# Patient Record
Sex: Female | Born: 1990 | Race: Black or African American | Hispanic: No | Marital: Single | Smoking: Current every day smoker
Health system: Southern US, Community
[De-identification: ages and names within clinical notes are randomized; demographics above are authoritative.]

## PROBLEM LIST (undated history)

## (undated) ENCOUNTER — Emergency Department (HOSPITAL_COMMUNITY): Admission: EM | Payer: Self-pay | Source: Home / Self Care

## (undated) DIAGNOSIS — Z8619 Personal history of other infectious and parasitic diseases: Secondary | ICD-10-CM

## (undated) DIAGNOSIS — B009 Herpesviral infection, unspecified: Secondary | ICD-10-CM

## (undated) DIAGNOSIS — B9689 Other specified bacterial agents as the cause of diseases classified elsewhere: Secondary | ICD-10-CM

## (undated) DIAGNOSIS — J302 Other seasonal allergic rhinitis: Secondary | ICD-10-CM

## (undated) DIAGNOSIS — E119 Type 2 diabetes mellitus without complications: Secondary | ICD-10-CM

## (undated) HISTORY — DX: Type 2 diabetes mellitus without complications: E11.9

## (undated) HISTORY — PX: NO PAST SURGERIES: SHX2092

## (undated) HISTORY — DX: Other seasonal allergic rhinitis: J30.2

## (undated) HISTORY — DX: Personal history of other infectious and parasitic diseases: Z86.19

## (undated) HISTORY — DX: Other specified bacterial agents as the cause of diseases classified elsewhere: B96.89

---
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
## 2008-05-15 ENCOUNTER — Emergency Department (HOSPITAL_COMMUNITY): Admission: EM | Admit: 2008-05-15 | Discharge: 2008-05-15 | Payer: Self-pay | Admitting: Emergency Medicine

## 2008-05-28 ENCOUNTER — Emergency Department (HOSPITAL_COMMUNITY): Admission: EM | Admit: 2008-05-28 | Discharge: 2008-05-28 | Payer: Self-pay | Admitting: Emergency Medicine

## 2008-09-20 ENCOUNTER — Emergency Department (HOSPITAL_COMMUNITY): Admission: EM | Admit: 2008-09-20 | Discharge: 2008-09-20 | Payer: Self-pay | Admitting: Emergency Medicine

## 2008-09-22 ENCOUNTER — Emergency Department (HOSPITAL_COMMUNITY): Admission: EM | Admit: 2008-09-22 | Discharge: 2008-09-22 | Payer: Self-pay | Admitting: Family Medicine

## 2009-06-05 ENCOUNTER — Emergency Department (HOSPITAL_COMMUNITY): Admission: EM | Admit: 2009-06-05 | Discharge: 2009-06-06 | Payer: Self-pay | Admitting: Emergency Medicine

## 2009-07-05 ENCOUNTER — Emergency Department (HOSPITAL_COMMUNITY): Admission: EM | Admit: 2009-07-05 | Discharge: 2009-07-05 | Payer: Self-pay | Admitting: Emergency Medicine

## 2009-09-11 ENCOUNTER — Emergency Department (HOSPITAL_COMMUNITY): Admission: EM | Admit: 2009-09-11 | Discharge: 2009-09-11 | Payer: Self-pay | Admitting: Emergency Medicine

## 2009-11-17 ENCOUNTER — Emergency Department (HOSPITAL_COMMUNITY): Admission: EM | Admit: 2009-11-17 | Discharge: 2009-11-17 | Payer: Self-pay | Admitting: Emergency Medicine

## 2009-12-11 ENCOUNTER — Emergency Department (HOSPITAL_COMMUNITY): Admission: EM | Admit: 2009-12-11 | Discharge: 2009-12-11 | Payer: Self-pay | Admitting: Emergency Medicine

## 2010-01-10 ENCOUNTER — Inpatient Hospital Stay (HOSPITAL_COMMUNITY): Admission: AD | Admit: 2010-01-10 | Discharge: 2010-01-10 | Payer: Self-pay | Admitting: Obstetrics & Gynecology

## 2010-04-17 ENCOUNTER — Emergency Department (HOSPITAL_COMMUNITY): Admission: EM | Admit: 2010-04-17 | Discharge: 2010-04-18 | Payer: Self-pay | Admitting: Internal Medicine

## 2010-06-16 ENCOUNTER — Emergency Department (HOSPITAL_COMMUNITY): Admission: EM | Admit: 2010-06-16 | Discharge: 2010-06-17 | Payer: Self-pay | Admitting: Emergency Medicine

## 2010-07-22 ENCOUNTER — Inpatient Hospital Stay (HOSPITAL_COMMUNITY): Admission: AD | Admit: 2010-07-22 | Discharge: 2010-07-22 | Payer: Self-pay | Admitting: Obstetrics and Gynecology

## 2010-07-22 ENCOUNTER — Ambulatory Visit: Payer: Self-pay | Admitting: Family

## 2010-10-05 ENCOUNTER — Emergency Department (HOSPITAL_COMMUNITY): Admission: EM | Admit: 2010-10-05 | Discharge: 2010-10-05 | Payer: Self-pay | Admitting: Emergency Medicine

## 2010-10-08 ENCOUNTER — Emergency Department (HOSPITAL_COMMUNITY): Admission: EM | Admit: 2010-10-08 | Discharge: 2010-10-08 | Payer: Self-pay | Admitting: Emergency Medicine

## 2010-12-31 ENCOUNTER — Other Ambulatory Visit: Payer: Self-pay

## 2010-12-31 ENCOUNTER — Emergency Department (HOSPITAL_COMMUNITY)
Admission: EM | Admit: 2010-12-31 | Discharge: 2010-12-31 | Discharge status: Against Medical Advice | Payer: Self-pay | Attending: Emergency Medicine | Admitting: Emergency Medicine

## 2010-12-31 DIAGNOSIS — Z0389 Encounter for observation for other suspected diseases and conditions ruled out: Secondary | ICD-10-CM | POA: Insufficient documentation

## 2011-01-31 ENCOUNTER — Emergency Department (HOSPITAL_COMMUNITY)
Admission: EM | Admit: 2011-01-31 | Discharge: 2011-01-31 | Disposition: A | Discharge status: Home / Self Care | Payer: Self-pay | Attending: Emergency Medicine | Admitting: Emergency Medicine

## 2011-01-31 DIAGNOSIS — Z0389 Encounter for observation for other suspected diseases and conditions ruled out: Secondary | ICD-10-CM | POA: Insufficient documentation

## 2011-02-05 LAB — URINALYSIS, ROUTINE W REFLEX MICROSCOPIC
Bilirubin Urine: NEGATIVE
Glucose, UA: NEGATIVE mg/dL
Hgb urine dipstick: NEGATIVE
Protein, ur: NEGATIVE mg/dL
Urobilinogen, UA: 0.2 mg/dL (ref 0.0–1.0)

## 2011-02-05 LAB — GC/CHLAMYDIA PROBE AMP, GENITAL: GC Probe Amp, Genital: POSITIVE — AB

## 2011-02-05 LAB — WET PREP, GENITAL
Trich, Wet Prep: NONE SEEN
Yeast Wet Prep HPF POC: NONE SEEN

## 2011-02-05 LAB — RPR: RPR Ser Ql: NONREACTIVE

## 2011-02-05 LAB — POCT PREGNANCY, URINE: Preg Test, Ur: NEGATIVE

## 2011-02-08 LAB — URINALYSIS, ROUTINE W REFLEX MICROSCOPIC
Bilirubin Urine: NEGATIVE
Glucose, UA: NEGATIVE mg/dL
Ketones, ur: NEGATIVE mg/dL
Protein, ur: NEGATIVE mg/dL
pH: 6 (ref 5.0–8.0)

## 2011-02-08 LAB — WET PREP, GENITAL: Yeast Wet Prep HPF POC: NONE SEEN

## 2011-02-08 LAB — GC/CHLAMYDIA PROBE AMP, GENITAL: Chlamydia, DNA Probe: NEGATIVE

## 2011-02-09 LAB — WET PREP, GENITAL: Yeast Wet Prep HPF POC: NONE SEEN

## 2011-02-09 LAB — URINALYSIS, ROUTINE W REFLEX MICROSCOPIC
Bilirubin Urine: NEGATIVE
Hgb urine dipstick: NEGATIVE
Ketones, ur: NEGATIVE mg/dL
Protein, ur: NEGATIVE mg/dL
Urobilinogen, UA: 1 mg/dL (ref 0.0–1.0)

## 2011-02-09 LAB — PREGNANCY, URINE: Preg Test, Ur: NEGATIVE

## 2011-02-09 LAB — GC/CHLAMYDIA PROBE AMP, GENITAL: GC Probe Amp, Genital: NEGATIVE

## 2011-02-10 LAB — URINALYSIS, ROUTINE W REFLEX MICROSCOPIC
Glucose, UA: NEGATIVE mg/dL
Hgb urine dipstick: NEGATIVE
Ketones, ur: NEGATIVE mg/dL
Protein, ur: 30 mg/dL — AB

## 2011-02-10 LAB — URINE MICROSCOPIC-ADD ON

## 2011-02-14 LAB — URINALYSIS, ROUTINE W REFLEX MICROSCOPIC
Ketones, ur: NEGATIVE mg/dL
Nitrite: NEGATIVE
Protein, ur: NEGATIVE mg/dL
pH: 6 (ref 5.0–8.0)

## 2011-02-14 LAB — POCT PREGNANCY, URINE: Preg Test, Ur: NEGATIVE

## 2011-02-25 LAB — DIFFERENTIAL
Eosinophils Absolute: 0.5 10*3/uL (ref 0.0–0.7)
Eosinophils Relative: 6 % — ABNORMAL HIGH (ref 0–5)
Lymphs Abs: 1.8 10*3/uL (ref 0.7–4.0)
Monocytes Relative: 6 % (ref 3–12)

## 2011-02-25 LAB — URINALYSIS, ROUTINE W REFLEX MICROSCOPIC
Bilirubin Urine: NEGATIVE
Glucose, UA: NEGATIVE mg/dL
Ketones, ur: 15 mg/dL — AB
pH: 6 (ref 5.0–8.0)

## 2011-02-25 LAB — POCT I-STAT, CHEM 8
Creatinine, Ser: 0.7 mg/dL (ref 0.4–1.2)
Glucose, Bld: 94 mg/dL (ref 70–99)
Hemoglobin: 13.6 g/dL (ref 12.0–15.0)
TCO2: 28 mmol/L (ref 0–100)

## 2011-02-25 LAB — CBC
HCT: 39.3 % (ref 36.0–46.0)
Hemoglobin: 13.4 g/dL (ref 12.0–15.0)
MCV: 90.4 fL (ref 78.0–100.0)
RBC: 4.34 MIL/uL (ref 3.87–5.11)
WBC: 8.8 10*3/uL (ref 4.0–10.5)

## 2011-02-25 LAB — WET PREP, GENITAL

## 2011-02-25 LAB — POCT PREGNANCY, URINE: Preg Test, Ur: NEGATIVE

## 2011-02-25 LAB — GC/CHLAMYDIA PROBE AMP, GENITAL: GC Probe Amp, Genital: NEGATIVE

## 2011-02-28 LAB — URINE CULTURE: Colony Count: >=100000

## 2011-02-28 LAB — URINALYSIS, ROUTINE W REFLEX MICROSCOPIC
Protein, ur: NEGATIVE mg/dL
Specific Gravity, Urine: 1.015 (ref 1.005–1.030)
Urobilinogen, UA: 1 mg/dL (ref 0.0–1.0)

## 2011-02-28 LAB — GC/CHLAMYDIA PROBE AMP, GENITAL: GC Probe Amp, Genital: NEGATIVE

## 2011-02-28 LAB — URINE MICROSCOPIC-ADD ON

## 2011-02-28 LAB — WET PREP, GENITAL
Trich, Wet Prep: NONE SEEN
Yeast Wet Prep HPF POC: NONE SEEN

## 2011-02-28 LAB — POCT PREGNANCY, URINE: Preg Test, Ur: NEGATIVE

## 2011-03-02 LAB — GC/CHLAMYDIA PROBE AMP, GENITAL: GC Probe Amp, Genital: NEGATIVE

## 2011-03-02 LAB — WET PREP, GENITAL
Trich, Wet Prep: NONE SEEN
Yeast Wet Prep HPF POC: NONE SEEN

## 2011-03-02 LAB — URINALYSIS, ROUTINE W REFLEX MICROSCOPIC
Glucose, UA: NEGATIVE mg/dL
Hgb urine dipstick: NEGATIVE
Protein, ur: NEGATIVE mg/dL
Specific Gravity, Urine: 1.02 (ref 1.005–1.030)
pH: 6 (ref 5.0–8.0)

## 2011-03-02 LAB — URINE MICROSCOPIC-ADD ON

## 2011-03-02 LAB — POCT PREGNANCY, URINE: Preg Test, Ur: NEGATIVE

## 2011-03-03 LAB — COMPREHENSIVE METABOLIC PANEL
ALT: 12 U/L (ref 0–35)
AST: 20 U/L (ref 0–37)
Alkaline Phosphatase: 58 U/L (ref 39–117)
CO2: 27 mEq/L (ref 19–32)
Calcium: 9.4 mg/dL (ref 8.4–10.5)
GFR calc Af Amer: >60 mL/min (ref >60)
Potassium: 3.5 mEq/L (ref 3.5–5.1)
Sodium: 135 mEq/L (ref 135–145)
Total Protein: 7.3 g/dL (ref 6.0–8.3)

## 2011-03-03 LAB — URINALYSIS, ROUTINE W REFLEX MICROSCOPIC
Bilirubin Urine: NEGATIVE
Hgb urine dipstick: NEGATIVE
Ketones, ur: 15 mg/dL — AB
Specific Gravity, Urine: 1.008 (ref 1.005–1.030)
pH: 7 (ref 5.0–8.0)

## 2011-03-03 LAB — CBC
Hemoglobin: 12.6 g/dL (ref 12.0–15.0)
MCHC: 35.1 g/dL (ref 30.0–36.0)
RBC: 4.05 MIL/uL (ref 3.87–5.11)
RDW: 12 % (ref 11.5–15.5)

## 2011-03-03 LAB — RPR: RPR Ser Ql: NONREACTIVE

## 2011-03-03 LAB — WET PREP, GENITAL
Trich, Wet Prep: NONE SEEN
WBC, Wet Prep HPF POC: NONE SEEN

## 2011-03-03 LAB — GC/CHLAMYDIA PROBE AMP, GENITAL
Chlamydia, DNA Probe: NEGATIVE
GC Probe Amp, Genital: NEGATIVE

## 2011-03-11 ENCOUNTER — Emergency Department (HOSPITAL_COMMUNITY)
Admission: EM | Admit: 2011-03-11 | Discharge: 2011-03-11 | Discharge status: Against Medical Advice | Payer: Self-pay | Attending: Emergency Medicine | Admitting: Emergency Medicine

## 2011-03-11 DIAGNOSIS — N898 Other specified noninflammatory disorders of vagina: Secondary | ICD-10-CM | POA: Insufficient documentation

## 2011-03-18 ENCOUNTER — Emergency Department (HOSPITAL_COMMUNITY)
Admission: EM | Admit: 2011-03-18 | Discharge: 2011-03-18 | Disposition: A | Discharge status: Home / Self Care | Payer: Self-pay | Attending: Emergency Medicine | Admitting: Emergency Medicine

## 2011-03-18 DIAGNOSIS — A64 Unspecified sexually transmitted disease: Secondary | ICD-10-CM | POA: Insufficient documentation

## 2011-03-18 DIAGNOSIS — N898 Other specified noninflammatory disorders of vagina: Secondary | ICD-10-CM | POA: Insufficient documentation

## 2011-03-18 LAB — URINALYSIS, ROUTINE W REFLEX MICROSCOPIC
Bilirubin Urine: NEGATIVE
Glucose, UA: NEGATIVE mg/dL
Hgb urine dipstick: NEGATIVE
Ketones, ur: NEGATIVE mg/dL
pH: 6.5 (ref 5.0–8.0)

## 2011-03-18 LAB — WET PREP, GENITAL: Yeast Wet Prep HPF POC: NONE SEEN

## 2011-03-18 LAB — POCT PREGNANCY, URINE: Preg Test, Ur: NEGATIVE

## 2011-03-19 LAB — GC/CHLAMYDIA PROBE AMP, GENITAL
Chlamydia, DNA Probe: NEGATIVE
GC Probe Amp, Genital: NEGATIVE

## 2011-06-17 ENCOUNTER — Inpatient Hospital Stay (INDEPENDENT_AMBULATORY_CARE_PROVIDER_SITE_OTHER)
Admission: RE | Admit: 2011-06-17 | Discharge: 2011-06-17 | Disposition: A | Discharge status: Home / Self Care | Payer: Self-pay | Source: Ambulatory Visit | Attending: Emergency Medicine | Admitting: Emergency Medicine

## 2011-06-17 DIAGNOSIS — Z0489 Encounter for examination and observation for other specified reasons: Secondary | ICD-10-CM

## 2011-06-17 DIAGNOSIS — N912 Amenorrhea, unspecified: Secondary | ICD-10-CM

## 2011-06-17 LAB — POCT URINALYSIS DIP (DEVICE)
Glucose, UA: NEGATIVE mg/dL
Ketones, ur: NEGATIVE mg/dL
Protein, ur: NEGATIVE mg/dL
Urobilinogen, UA: 0.2 mg/dL (ref 0.0–1.0)

## 2011-06-17 LAB — WET PREP, GENITAL: Yeast Wet Prep HPF POC: NONE SEEN

## 2011-06-17 LAB — POCT PREGNANCY, URINE: Preg Test, Ur: NEGATIVE

## 2011-07-17 ENCOUNTER — Emergency Department (HOSPITAL_COMMUNITY)
Admission: EM | Admit: 2011-07-17 | Discharge: 2011-07-17 | Disposition: A | Discharge status: Home / Self Care | Payer: Self-pay | Attending: Emergency Medicine | Admitting: Emergency Medicine

## 2011-07-17 DIAGNOSIS — IMO0002 Reserved for concepts with insufficient information to code with codable children: Secondary | ICD-10-CM | POA: Insufficient documentation

## 2011-07-17 DIAGNOSIS — L732 Hidradenitis suppurativa: Secondary | ICD-10-CM | POA: Insufficient documentation

## 2011-07-17 DIAGNOSIS — R630 Anorexia: Secondary | ICD-10-CM | POA: Insufficient documentation

## 2011-07-17 DIAGNOSIS — R112 Nausea with vomiting, unspecified: Secondary | ICD-10-CM | POA: Insufficient documentation

## 2011-07-17 LAB — COMPREHENSIVE METABOLIC PANEL
ALT: 19 U/L (ref 0–35)
Albumin: 3.9 g/dL (ref 3.5–5.2)
Alkaline Phosphatase: 76 U/L (ref 39–117)
Calcium: 9.7 mg/dL (ref 8.4–10.5)
GFR calc Af Amer: >60 mL/min (ref >60)
Potassium: 3.4 mEq/L — ABNORMAL LOW (ref 3.5–5.1)
Sodium: 138 mEq/L (ref 135–145)
Total Protein: 8.2 g/dL (ref 6.0–8.3)

## 2011-07-17 LAB — CBC
HCT: 36.9 % (ref 36.0–46.0)
Hemoglobin: 12.6 g/dL (ref 12.0–15.0)
MCV: 84.2 fL (ref 78.0–100.0)
Platelets: 245 10*3/uL (ref 150–400)
RBC: 4.38 MIL/uL (ref 3.87–5.11)
WBC: 8.8 10*3/uL (ref 4.0–10.5)

## 2011-07-17 LAB — URINALYSIS, ROUTINE W REFLEX MICROSCOPIC
Bilirubin Urine: NEGATIVE
Hgb urine dipstick: NEGATIVE
Nitrite: NEGATIVE
Specific Gravity, Urine: 1.014 (ref 1.005–1.030)
Urobilinogen, UA: 1 mg/dL (ref 0.0–1.0)
pH: 7 (ref 5.0–8.0)

## 2011-07-17 LAB — DIFFERENTIAL
Lymphocytes Relative: 20 % (ref 12–46)
Lymphs Abs: 1.8 10*3/uL (ref 0.7–4.0)
Neutrophils Relative %: 66 % (ref 43–77)

## 2011-07-17 LAB — URINE MICROSCOPIC-ADD ON

## 2011-07-17 LAB — POCT PREGNANCY, URINE: Preg Test, Ur: NEGATIVE

## 2011-08-01 ENCOUNTER — Emergency Department (HOSPITAL_COMMUNITY)
Admission: EM | Admit: 2011-08-01 | Discharge: 2011-08-01 | Disposition: A | Discharge status: Home / Self Care | Payer: Self-pay | Attending: Emergency Medicine | Admitting: Emergency Medicine

## 2011-08-01 DIAGNOSIS — M79609 Pain in unspecified limb: Secondary | ICD-10-CM | POA: Insufficient documentation

## 2011-08-01 DIAGNOSIS — IMO0002 Reserved for concepts with insufficient information to code with codable children: Secondary | ICD-10-CM | POA: Insufficient documentation

## 2011-08-22 LAB — URINALYSIS, ROUTINE W REFLEX MICROSCOPIC
Bilirubin Urine: NEGATIVE
Glucose, UA: NEGATIVE
Ketones, ur: 15 — AB
Protein, ur: 100 — AB
Urobilinogen, UA: 2 — ABNORMAL HIGH

## 2011-08-22 LAB — POCT I-STAT, CHEM 8
BUN: 6
Calcium, Ion: 1.25
Creatinine, Ser: 1
Glucose, Bld: 78
Hemoglobin: 14.3
TCO2: 31

## 2011-08-22 LAB — CBC
Hemoglobin: 12.8
MCHC: 34.3
MCV: 90.2
RBC: 4.14
RDW: 12.4

## 2011-08-22 LAB — PREGNANCY, URINE: Preg Test, Ur: NEGATIVE

## 2011-08-22 LAB — URINE MICROSCOPIC-ADD ON

## 2011-08-22 LAB — WET PREP, GENITAL
Clue Cells Wet Prep HPF POC: NONE SEEN
Trich, Wet Prep: NONE SEEN

## 2011-08-22 LAB — DIFFERENTIAL
Basophils Absolute: 0
Basophils Relative: 1
Eosinophils Absolute: 0.4
Monocytes Absolute: 0.6
Monocytes Relative: 7

## 2011-08-26 LAB — POCT URINALYSIS DIP (DEVICE)
Hgb urine dipstick: NEGATIVE
Ketones, ur: NEGATIVE
Operator id: 247071
Protein, ur: NEGATIVE
Specific Gravity, Urine: 1.015

## 2011-08-26 LAB — GC/CHLAMYDIA PROBE AMP, GENITAL: GC Probe Amp, Genital: NEGATIVE

## 2011-08-26 LAB — WET PREP, GENITAL: Yeast Wet Prep HPF POC: NONE SEEN

## 2011-08-26 LAB — POCT PREGNANCY, URINE: Preg Test, Ur: NEGATIVE

## 2011-09-03 LAB — POCT URINALYSIS DIP (DEVICE)
Glucose, UA: NEGATIVE
Hgb urine dipstick: NEGATIVE
Ketones, ur: NEGATIVE
Operator id: 235561
Specific Gravity, Urine: 1.015

## 2011-09-03 LAB — GC/CHLAMYDIA PROBE AMP, GENITAL
Chlamydia, DNA Probe: NEGATIVE
GC Probe Amp, Genital: NEGATIVE

## 2011-09-03 LAB — WET PREP, GENITAL
Clue Cells Wet Prep HPF POC: NONE SEEN
Yeast Wet Prep HPF POC: NONE SEEN

## 2011-09-06 LAB — POCT URINALYSIS DIP (DEVICE)
Hgb urine dipstick: NEGATIVE
Ketones, ur: NEGATIVE
Protein, ur: NEGATIVE
Specific Gravity, Urine: 1.025
pH: 6.5

## 2011-09-06 LAB — WET PREP, GENITAL: Trich, Wet Prep: NONE SEEN

## 2011-09-06 LAB — GC/CHLAMYDIA PROBE AMP, GENITAL: GC Probe Amp, Genital: NEGATIVE

## 2011-10-08 ENCOUNTER — Inpatient Hospital Stay (HOSPITAL_COMMUNITY)
Admission: AD | Admit: 2011-10-08 | Discharge: 2011-10-08 | Disposition: A | Discharge status: Home / Self Care | Payer: Self-pay | Source: Ambulatory Visit | Attending: Family Medicine | Admitting: Family Medicine

## 2011-10-08 DIAGNOSIS — R109 Unspecified abdominal pain: Secondary | ICD-10-CM | POA: Insufficient documentation

## 2011-10-08 DIAGNOSIS — N912 Amenorrhea, unspecified: Secondary | ICD-10-CM | POA: Insufficient documentation

## 2011-10-08 LAB — POCT PREGNANCY, URINE: Preg Test, Ur: NEGATIVE

## 2011-10-08 NOTE — Progress Notes (Signed)
Patient states she has not had a period since February. Has not done a pregnancy test. Has had unprotected sex with someone who has other partners and wants to be tested. Has cramping in the morning, none now.

## 2011-10-24 ENCOUNTER — Emergency Department (HOSPITAL_COMMUNITY)
Admission: EM | Admit: 2011-10-24 | Discharge: 2011-10-24 | Discharge status: Against Medical Advice | Payer: Self-pay | Attending: Emergency Medicine | Admitting: Emergency Medicine

## 2011-10-24 ENCOUNTER — Encounter: Payer: Self-pay | Admitting: *Deleted

## 2011-10-24 DIAGNOSIS — Z202 Contact with and (suspected) exposure to infections with a predominantly sexual mode of transmission: Secondary | ICD-10-CM | POA: Insufficient documentation

## 2011-10-24 NOTE — ED Notes (Signed)
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  Pt has some bumps on her fingers she is worried about and she has some burning with urination

## 2012-01-15 ENCOUNTER — Emergency Department (HOSPITAL_COMMUNITY)
Admission: EM | Admit: 2012-01-15 | Discharge: 2012-01-15 | Disposition: A | Discharge status: Home / Self Care | Payer: Self-pay | Attending: Emergency Medicine | Admitting: Emergency Medicine

## 2012-01-15 ENCOUNTER — Encounter (HOSPITAL_COMMUNITY): Payer: Self-pay | Admitting: Emergency Medicine

## 2012-01-15 DIAGNOSIS — B86 Scabies: Secondary | ICD-10-CM | POA: Insufficient documentation

## 2012-01-15 DIAGNOSIS — F17200 Nicotine dependence, unspecified, uncomplicated: Secondary | ICD-10-CM | POA: Insufficient documentation

## 2012-01-15 MED ORDER — PERMETHRIN 5 % EX CREA: TOPICAL_CREAM | CUTANEOUS | Status: AC

## 2012-01-15 NOTE — Discharge Instructions (Signed)
Scabies Scabies are small bugs (mites) that burrow under the skin and cause red bumps and severe itching. These bugs can only be seen with a microscope. Scabies are highly contagious. They can spread easily from person to person by direct contact. They are also spread through sharing clothing or linens that have the scabies mites living in them. It is not unusual for an entire family to become infected through shared towels, clothing, or bedding.   HOME CARE INSTRUCTIONS    Your caregiver may prescribe a cream or lotion to kill the mites. If this cream is prescribed; massage the cream into the entire area of the body from the neck to the bottom of both feet. Also massage the cream into the scalp and face if your child is less than 1 year old. Avoid the eyes and mouth.     Leave the cream on for 8 to12 hours. Do not wash your hands after application. Your child should bathe or shower after the 8 to 12 hour application period. Sometimes it is helpful to apply the cream to your child at right before bedtime.     One treatment is usually effective and will eliminate approximately 95% of infestations. For severe cases, your caregiver may decide to repeat the treatment in 1 week. Everyone in your household should be treated with one application of the cream.     New rashes or burrows should not appear after successful treatment within 24 to 48 hours; however the itching and rash may last for 2 to 4 weeks after successful treatment. If your symptoms persist longer than this, see your caregiver.     Your caregiver also may prescribe a medication to help with the itching or to help the rash go away more quickly.     Scabies can live on clothing or linens for up to 3 days. Your entire child's recently used clothing, towels, stuffed toys, and bed linens should be washed in hot water and then dried in a dryer for at least 20 minutes on high heat. Items that cannot be washed should be enclosed in a plastic bag for  at least 3 days.     To help relieve itching, bathe your child in a cool bath or apply cool washcloths to the affected areas.     Your child may return to school after treatment with the prescribed cream.  SEEK MEDICAL CARE IF:    The itching persists longer than 4 weeks after treatment.     The rash spreads or becomes infected (the area has red blisters or yellow-tan crust).  Document Released: 11/11/2005 Document Revised: 07/24/2011 Document Reviewed: 03/22/2009 ExitCare Patient Information 2012 [employer]. 

## 2012-01-15 NOTE — ED Notes (Signed)
Pt alert, nad, c/o rash to hand, feet, trunk, family hx of scabies, onset several weeks ago, states medication is expensive, resp even unlabored, skin pwd

## 2012-01-16 NOTE — ED Provider Notes (Signed)
History     CSN: [id number]  Arrival date & time 01/15/12  2047   First MD Initiated Contact with Patient 01/15/12 2132      Chief Complaint  Patient presents with  . Insect Bite  . Scabies     (Consider location/radiation/quality/duration/timing/severity/associated sxs/prior treatment) HPI Comments: Patient reports periodic rash of bilateral hands and arms waistline and lower legs and feet.  Patient states that she knows that it is scabies because several members of her family also have been diagnosed with scabies.  The rash began approximately one month ago.  Denies any fevers.  The history is provided by the patient.    History reviewed. No pertinent past medical history.  History reviewed. No pertinent past surgical history.  No family history on file.  History  Substance Use Topics  . Smoking status: Current Everyday Smoker    Types: Cigarettes  . Smokeless tobacco: Not on file  . Alcohol Use: Yes    OB History    Grav Para Term Preterm Abortions TAB SAB Ect Mult Living                  Review of Systems  Constitutional: Negative for fever and fatigue.  Skin: Positive for rash. Negative for wound.  All other systems reviewed and are negative.    Allergies  Review of patient's allergies indicates no known allergies.  Home Medications   Current Outpatient Rx  Name Route Sig Dispense Refill  . PERMETHRIN 5 % EX CREA  Apply from head to the soles of your feet once, leave on 8-14 hours then wash off.  You may repeat after 7 days if needed. 60 g 0    BP 115/78  Pulse 95  Temp(Src) 98.3 F (36.8 C) (Oral)  Resp 16  Ht 5\' 6"  (1.676 m)  Wt 185 lb 3.2 oz (84.006 kg)  BMI 29.89 kg/m2  SpO2 98%  Physical Exam  Nursing note and vitals reviewed. Constitutional: She is oriented to person, place, and time. She appears well-developed and well-nourished.  HENT:  Head: Normocephalic and atraumatic.  Neck: Neck supple.  Pulmonary/Chest: Effort normal.    Neurological: She is alert and oriented to person, place, and time.  Skin: Rash noted.       Rash consistent with scabies.  Erythematous papules with central ulceration and burrows - interweb space of bilateral hands, bilateral forearms, suprapubic area, ankles.    Psychiatric: She has a normal mood and affect. Her behavior is normal.    ED Course  Procedures (including critical care time)  Labs Reviewed - No data to display No results found.   1. Scabies       MDM  Well-appearing patient with scabies.  No e/o superinfection.  Pt d/c home with treatment and explanation of how to use the cream.  Patient verbalizes understanding and agrees with plan.         Dillard Cannon Louise, [place] 01/16/12 [phone]

## 2012-01-16 NOTE — ED Provider Notes (Signed)
Medical screening examination/treatment/procedure(s) were performed by non-physician practitioner and as supervising physician I was immediately available for consultation/collaboration. Devoria Albe, MD, FACEP   Ward Givens, MD 01/16/12 1021

## 2012-05-01 ENCOUNTER — Encounter (HOSPITAL_COMMUNITY): Payer: Self-pay | Admitting: Emergency Medicine

## 2012-05-01 ENCOUNTER — Emergency Department (HOSPITAL_COMMUNITY)
Admission: EM | Admit: 2012-05-01 | Discharge: 2012-05-01 | Disposition: A | Discharge status: Home / Self Care | Payer: Self-pay | Attending: Emergency Medicine | Admitting: Emergency Medicine

## 2012-05-01 DIAGNOSIS — F17200 Nicotine dependence, unspecified, uncomplicated: Secondary | ICD-10-CM | POA: Insufficient documentation

## 2012-05-01 DIAGNOSIS — R109 Unspecified abdominal pain: Secondary | ICD-10-CM | POA: Insufficient documentation

## 2012-05-01 LAB — URINALYSIS, ROUTINE W REFLEX MICROSCOPIC
Glucose, UA: NEGATIVE mg/dL
Ketones, ur: NEGATIVE mg/dL
Leukocytes, UA: NEGATIVE
Protein, ur: NEGATIVE mg/dL
Urobilinogen, UA: 0.2 mg/dL (ref 0.0–1.0)

## 2012-05-01 LAB — COMPREHENSIVE METABOLIC PANEL
BUN: 6 mg/dL (ref 6–23)
Calcium: 9.9 mg/dL (ref 8.4–10.5)
GFR calc Af Amer: >90 mL/min (ref >90)
Glucose, Bld: 94 mg/dL (ref 70–99)
Sodium: 142 mEq/L (ref 135–145)
Total Protein: 7.8 g/dL (ref 6.0–8.3)

## 2012-05-01 LAB — CBC
Hemoglobin: 13.4 g/dL (ref 12.0–15.0)
MCH: 29.6 pg (ref 26.0–34.0)
MCHC: 34.5 g/dL (ref 30.0–36.0)

## 2012-05-01 MED ORDER — OMEPRAZOLE 20 MG PO CPDR: 20.0000 mg | DELAYED_RELEASE_CAPSULE | Freq: Every day | ORAL | Status: DC

## 2012-05-01 NOTE — ED Notes (Signed)
Per pt, having headache and abd pain for 2 weeks, esp in AM and prior to going to bed; reports cramping in lower abd; denies dysuria, had some spotting about a month ago; denies being on birth control; pt reports having nausea, but no vomiting; also having diarrhea

## 2012-05-01 NOTE — ED Notes (Signed)
Pt c/o lower abd pain x3-4 weeks, headache x2 days, N/D every am x3-4 weeks. Pt is unsure if she is pregnant, states "I don't have periods, they just don't come on." Pt denies taking BC, problems w/urination, abnormal vaginal odor or d/c

## 2012-05-01 NOTE — Discharge Instructions (Signed)
You were seen and evaluated for your symptoms of abdominal cramping and headache. At this time your lab testing and urine tests do not show any signs for concerning or emergent cause your symptoms. Your providers today also feel your examination/L. a concerning findings. At this time he may return home and followup to primary care provider or OB/GYN specialist for continued evaluation and treatment of your symptoms. Return to emergency room for any worsening symptoms.    Abdominal Pain (Nonspecific) Your exam might not show the exact reason you have abdominal pain. Since there are many different causes of abdominal pain, another checkup and more tests may be needed. It is very important to follow up for lasting (persistent) or worsening symptoms. A possible cause of abdominal pain in any person who still has his or her appendix is acute appendicitis. Appendicitis is often hard to diagnose. Normal blood tests, urine tests, ultrasound, and CT scans do not completely rule out early appendicitis or other causes of abdominal pain. Sometimes, only the changes that happen over time will allow appendicitis and other causes of abdominal pain to be determined. Other potential problems that may require surgery may also take time to become more apparent. Because of this, it is important that you follow all of the instructions below. HOME CARE INSTRUCTIONS   Rest as much as possible.   Do not eat solid food until your pain is gone.   While adults or children have pain: A diet of water, weak decaffeinated tea, broth or bouillon, gelatin, oral rehydration solutions (ORS), frozen ice pops, or ice chips may be helpful.   When pain is gone in adults or children: Start a light diet (dry toast, crackers, applesauce, or white rice). Increase the diet slowly as long as it does not bother you. Eat no dairy products (including cheese and eggs) and no spicy, fatty, fried, or high-fiber foods.   Use no alcohol, caffeine, or  cigarettes.   Take your regular medicines unless your caregiver told you not to.   Take any prescribed medicine as directed.   Only take over-the-counter or prescription medicines for pain, discomfort, or fever as directed by your caregiver. Do not give aspirin to children.  If your caregiver has given you a follow-up appointment, it is very important to keep that appointment. Not keeping the appointment could result in a permanent injury and/or lasting (chronic) pain and/or disability. If there is any problem keeping the appointment, you must call to reschedule.  SEEK IMMEDIATE MEDICAL CARE IF:   Your pain is not gone in 24 hours.   Your pain becomes worse, changes location, or feels different.   You or your child has an oral temperature above 102 F (38.9 C), not controlled by medicine.   Your baby is older than 3 months with a rectal temperature of 102 F (38.9 C) or higher.   Your baby is 79 months old or younger with a rectal temperature of 100.4 F (38 C) or higher.   You have shaking chills.   You keep throwing up (vomiting) or cannot drink liquids.   There is blood in your vomit or you see blood in your bowel movements.   Your bowel movements become dark or black.   You have frequent bowel movements.   Your bowel movements stop (become blocked) or you cannot pass gas.   You have bloody, frequent, or painful urination.   You have yellow discoloration in the skin or whites of the eyes.   Your stomach  becomes bloated or bigger.   You have dizziness or fainting.   You have chest or back pain.  MAKE SURE YOU:   Understand these instructions.   Will watch your condition.   Will get help right away if you are not doing well or get worse.  Document Released: 11/11/2005 Document Revised: 10/31/2011 Document Reviewed: 10/09/2009 [facility] Patient Information 2012 [place], [place].     RESOURCE GUIDE  Chronic Pain Problems: Contact [facility]   [phone] Patients need to be referred by their primary care doctor.  Insufficient Money for Medicine: Contact United Way:  call "211" or [facility] [phone].  No Primary Care Doctor: - Call [facility] Connect  [phone] - can help you locate a primary care doctor that  accepts your insurance, provides certain services, etc. - Physician Referral Service- [phone]  Agencies that provide inexpensive medical care: - [facility]  [phone] - [facility]  [phone] - [facility]  [phone] - Women's Clinic  [phone] - Planned Parenthood  [phone] [facility]  [phone]  Medicaid-accepting [facility] Providers: - [facility]- [address]  [phone], Mon-Fri 9am-7pm, Sat 9am-1pm - [facility]- [address] [place], Suite [place]  [phone] - [facility]- [address], [address] [place]  [phone] [facility]- [address]  [phone] - Renaye Rakers- [address] [place], [address], [phone]  Only accepts [place] patients after they have their name  applied to their card  Self Pay (no insurance) in [place]: - Sickle Cell Patients: Dr Willey Blade, [facility]  [address] [place], [phone] - [facility]- [address] [place]  [phone]       [facility] [place]- [address]       -     [facility]- see information above (Speak to [employer] if you do not have insurance)       -  [facility]- [address] [place], [phone]       -  [facility]- [address],  [phone]       -  [facility]- [address] [phone]       -  Dr Julio Sicks-  [address], [place], [phone]       -  [facility]- [address] [phone]       -  [facility]- [address],  [phone], also [address], [phone]       -    [facility]- [address] [place], [phone], 1st & 3rd Saturday   every month, 10am-1pm  1) Find a Doctor and Pay Out of Pocket Although you won't have to find out who is covered by your insurance plan, it is a good idea to ask around and get recommendations. You will then need to call the office and see if the doctor you have chosen will accept you as a new patient and what types of options they offer for patients who are self-pay. Some doctors offer discounts or will set up payment plans for their patients who do not have insurance, but you will need to ask so you aren't surprised when you get to your appointment.  2) Contact Your Local Health Department Not all health departments have doctors that can see patients for sick visits, but many do, so it is worth a call to see if yours does. If you don't know where your local health department is, you can check in your phone book. The CDC also has a tool to help you locate your state's health department, and many state websites also have listings of all of their local health departments.  3) Find a Walk-in Clinic If your illness is not likely to be very severe or complicated, you may want to try a walk in clinic. These are popping up all over the country in pharmacies, drugstores, and shopping centers. They're usually staffed by nurse practitioners or physician assistants that have been trained to treat common illnesses and complaints. They're usually fairly quick and inexpensive. However, if you have serious medical issues or chronic medical problems, these are probably not your best option  STD Testing - [facility] Department of [facility] [place], STD Clinic, [address] [place], phone [phone] or [phone].  Monday - Friday, call for an appointment. [facility] Department of [employer], STD Clinic, [place] [address], [place], phone  [phone] or [phone].  Monday - Friday, call for an appointment.  Abuse/Neglect: [facility] Child Abuse Hotline 504-626-1529 [facility] Child Abuse Hotline (838)673-8347 (After Hours)  Emergency Shelter:  [facility] 639-177-6635  Maternity Homes: - [facility] [place] [facility] (878) [phone] - [facility] [phone]  MRSA Hotline #:   9380297013  [facility] Resources  [facility] [place]  United [employer] [facility] Dept. [address]                 [address]         [address] [place] [address]  Blondell Reveal Phone:  [phone]                                  Phone:  [phone]                   Phone:  [phone]  [facility] Mental Health, [phone] - [phone]       -     [facility] in [place], [address]                                  [phone], [facility] Child Abuse Hotline (603)208-4295 or (972) 747-8227 (After Hours)   Behavioral Health Services  Substance Abuse Resources: - [facility] and Drug Services  (704)497-5714 - [facility] (734)135-3920 - [facility] [phone] Floydene Flock [phone] - Residential & Outpatient Substance Abuse Program  [phone]  Psychological Services: [facility]  [phone] -  [employer] [facility]  [phone] - [facility], [place] [address] [place], ACCESS LINE: (986)580-4890 or [phone], [email or web address]  Dental Assistance  If unable to pay or uninsured, contact:  [facility] or [facility]. to become qualified for the adult dental clinic.  Patients with Medicaid: [facility] [phone] [address], [phone] [address] [phone]  If unable to pay, or uninsured, contact [facility] [phone]) or [facility] Department [phone] in [place], [phone] in [facility]) to become qualified for the adult dental clinic  Other Low-Cost Community Dental Services: - [facility]- [address] [place], [place], [place], [address], [phone], [phone], 2nd and 4th Thursday of the month at 6:30am.  10 clients each day by appointment, can sometimes see walk-in patients if someone does not show for an appointment. [facility]- [address] [place], [place], [address], [phone] - [facility]- [address] [place], [place], [address], [phone] - [facility]- [phone] [facility] Health Department- [phone] [facility] Department- [phone]

## 2012-05-01 NOTE — ED Provider Notes (Signed)
History     CSN: [id number]  Arrival date & time 05/01/12  1629   First MD Initiated Contact with Patient 05/01/12 2100      Chief Complaint  Patient presents with  . Abdominal Pain   HPI  History provided by the patient. Patient is a 21 year old female with no significant past medical history who presents with complaints of intermittent abdominal cramping for the past 2 weeks. Patient reports having generalized and lower abdominal cramping that is brief and intermittent. Pain does seem to be improved when patient eats or has a bowel movement. She does report having some soft "diarrhea-like" stools for the past one week. Patient states that she goes to the bathroom regularly once in the morning. Patient also reports having a generalized goal headache for the past several days. Headache is intermittent and a better with over-the-counter medicines at times. Patient denies having any fever, chills, sweats, nausea vomiting symptoms. She denies any neck pain or stiffness. Patient also reports some irregular menstrual cycles. She is not on any birth control medications. She denies any dysuria, hematuria, urinary frequency, vaginal bleeding or vaginal discharge.    History reviewed. No pertinent past medical history.  History reviewed. No pertinent past surgical history.  History reviewed. No pertinent family history.  History  Substance Use Topics  . Smoking status: Current Everyday Smoker -- 0.5 packs/day    Types: Cigarettes  . Smokeless tobacco: Not on file  . Alcohol Use: Yes     occasion    OB History    Grav Para Term Preterm Abortions TAB SAB Ect Mult Living                  Review of Systems  Constitutional: Negative for fever, chills, appetite change and fatigue.  Respiratory: Negative for shortness of breath.   Cardiovascular: Negative for chest pain.  Gastrointestinal: Positive for abdominal pain and diarrhea. Negative for nausea, vomiting and constipation.    Genitourinary: Negative for dysuria, frequency, hematuria, flank pain, vaginal bleeding and vaginal discharge.  Neurological: Positive for headaches. Negative for dizziness and light-headedness.    Allergies  Review of patient's allergies indicates no known allergies.  Home Medications  No current outpatient prescriptions on file.  BP 104/60  Pulse 74  Temp(Src) 97.8 F (36.6 C) (Oral)  Resp 14  SpO2 97%  LMP 03/29/2012  Physical Exam  Nursing note and vitals reviewed. Constitutional: She is oriented to person, place, and time. She appears well-developed and well-nourished. No distress.  HENT:  Head: Normocephalic and atraumatic.  Cardiovascular: Normal rate and regular rhythm.   Pulmonary/Chest: Effort normal and breath sounds normal. She has no wheezes. She has no rales.  Abdominal: Soft. She exhibits no distension. There is no tenderness. There is no rebound and no guarding.  Musculoskeletal: She exhibits no edema and no tenderness.  Neurological: She is alert and oriented to person, place, and time. She has normal strength. No cranial nerve deficit or sensory deficit. Gait normal.  Skin: Skin is warm and dry. No rash noted.  Psychiatric: She has a normal mood and affect. Her behavior is normal.    ED Course  Procedures  Results for orders placed during the hospital encounter of 05/01/12  URINALYSIS, ROUTINE W REFLEX MICROSCOPIC      Component Value Range   Color, Urine YELLOW  YELLOW    APPearance CLEAR  CLEAR    Specific Gravity, Urine 1.014  1.005 - 1.030    pH 7.0  5.0 -  8.0    Glucose, UA NEGATIVE  NEGATIVE (mg/dL)   Hgb urine dipstick NEGATIVE  NEGATIVE    Bilirubin Urine NEGATIVE  NEGATIVE    Ketones, ur NEGATIVE  NEGATIVE (mg/dL)   Protein, ur NEGATIVE  NEGATIVE (mg/dL)   Urobilinogen, UA 0.2  0.0 - 1.0 (mg/dL)   Nitrite NEGATIVE  NEGATIVE    Leukocytes, UA NEGATIVE  NEGATIVE   PREGNANCY, URINE      Component Value Range   Preg Test, Ur NEGATIVE   NEGATIVE   CBC      Component Value Range   WBC 7.7  4.0 - 10.5 (K/uL)   RBC 4.53  3.87 - 5.11 (MIL/uL)   Hemoglobin 13.4  12.0 - 15.0 (g/dL)   HCT 16.1  09.6 - 04.5 (%)   MCV 85.7  78.0 - 100.0 (fL)   MCH 29.6  26.0 - 34.0 (pg)   MCHC 34.5  30.0 - 36.0 (g/dL)   RDW 40.9  81.1 - 91.4 (%)   Platelets 233  150 - 400 (K/uL)  COMPREHENSIVE METABOLIC PANEL      Component Value Range   Sodium 142  135 - 145 (mEq/L)   Potassium 4.0  3.5 - 5.1 (mEq/L)   Chloride 103  96 - 112 (mEq/L)   CO2 29  19 - 32 (mEq/L)   Glucose, Bld 94  70 - 99 (mg/dL)   BUN 6  6 - 23 (mg/dL)   Creatinine, Ser 7.82  0.50 - 1.10 (mg/dL)   Calcium 9.9  8.4 - 95.6 (mg/dL)   Total Protein 7.8  6.0 - 8.3 (g/dL)   Albumin 3.8  3.5 - 5.2 (g/dL)   AST 25  0 - 37 (U/L)   ALT 25  0 - 35 (U/L)   Alkaline Phosphatase 80  39 - 117 (U/L)   Total Bilirubin 0.2 (*) 0.3 - 1.2 (mg/dL)   GFR calc non Af Amer >90  >90 (mL/min)   GFR calc Af Amer >90  >90 (mL/min)       1. Abdominal cramping       MDM  Patient seen and evaluated. Patient no acute distress.   I discussed lab findings with patient. I offered patient medications for symptoms. This time she does not wish to have any medications. Patient has a soft abdomen with no significant tenderness. No pain over McBurney's point or peritoneal signs. Patient has normal WBC. This time do not suspect appendicitis or acute abdomen. Patient reports symptoms are intermittent. Symptoms could be related to IBS. This was explained. Patient denied any gynecological problems. We did discuss possibilities for gynecological cause given her irregular menstrual history. Patient was encouraged to followup with primary care provider or OB/GYN as well.    Angus Seller, [place] 05/02/12 [phone]

## 2012-05-02 NOTE — ED Provider Notes (Signed)
Medical screening examination/treatment/procedure(s) were performed by non-physician practitioner and as supervising physician I was immediately available for consultation/collaboration.   Carleene Cooper III, MD 05/02/12 [phone]

## 2012-11-30 ENCOUNTER — Emergency Department (HOSPITAL_COMMUNITY)
Admission: EM | Admit: 2012-11-30 | Discharge: 2012-11-30 | Disposition: A | Discharge status: Home / Self Care | Payer: Self-pay | Attending: Emergency Medicine | Admitting: Emergency Medicine

## 2012-11-30 ENCOUNTER — Emergency Department (HOSPITAL_COMMUNITY): Payer: Self-pay

## 2012-11-30 ENCOUNTER — Encounter (HOSPITAL_COMMUNITY): Payer: Self-pay | Admitting: Emergency Medicine

## 2012-11-30 DIAGNOSIS — F17200 Nicotine dependence, unspecified, uncomplicated: Secondary | ICD-10-CM | POA: Insufficient documentation

## 2012-11-30 DIAGNOSIS — R071 Chest pain on breathing: Secondary | ICD-10-CM | POA: Insufficient documentation

## 2012-11-30 DIAGNOSIS — R109 Unspecified abdominal pain: Secondary | ICD-10-CM | POA: Insufficient documentation

## 2012-11-30 DIAGNOSIS — M791 Myalgia, unspecified site: Secondary | ICD-10-CM

## 2012-11-30 LAB — URINALYSIS, ROUTINE W REFLEX MICROSCOPIC
Leukocytes, UA: NEGATIVE
Nitrite: NEGATIVE
Specific Gravity, Urine: 1.02 (ref 1.005–1.030)
pH: 6 (ref 5.0–8.0)

## 2012-11-30 MED ORDER — HYDROCODONE-ACETAMINOPHEN 5-325 MG PO TABS
1.0000 | ORAL_TABLET | Freq: Once | ORAL | Status: AC
  Administered 2012-11-30: 1 via ORAL
  Filled 2012-11-30: qty 1

## 2012-11-30 MED ORDER — HYDROCODONE-ACETAMINOPHEN 5-325 MG PO TABS: 1.0000 | ORAL_TABLET | Freq: Four times a day (QID) | ORAL | Status: DC | PRN

## 2012-11-30 MED ORDER — HYDROCODONE-ACETAMINOPHEN 5-325 MG PO TABS
1.0000 | ORAL_TABLET | Freq: Once | ORAL | Status: AC
  Administered 2012-11-30: 1 via ORAL

## 2012-11-30 MED ORDER — IBUPROFEN 400 MG PO TABS
600.0000 mg | ORAL_TABLET | Freq: Once | ORAL | Status: AC
  Administered 2012-11-30: 600 mg via ORAL

## 2012-11-30 MED ORDER — IBUPROFEN 600 MG PO TABS: 600.0000 mg | ORAL_TABLET | Freq: Four times a day (QID) | ORAL | Status: DC | PRN

## 2012-11-30 MED ORDER — IBUPROFEN 400 MG PO TABS
600.0000 mg | ORAL_TABLET | Freq: Once | ORAL | Status: AC
  Administered 2012-11-30: 600 mg via ORAL
  Filled 2012-11-30: qty 1

## 2012-11-30 NOTE — ED Provider Notes (Signed)
History   This chart was scribed for Derwood Kaplan, MD by Gerlean Ren, ED Scribe. This patient was seen in room TR07C/TR07C and the patient's care was started at 4:49 PM    CSN: [id number]  Arrival date & time 11/30/12  1404   First MD Initiated Contact with Patient 11/30/12 1634      Chief Complaint  Patient presents with  . Flank Pain     The history is provided by the patient. No language interpreter was used.   Stephanie Reid is a 22 y.o. female who presents to the Emergency Department complaining ofn sudden onset, constant, gradually worsening, sharp left flank pain worsened by deep breathing and movements that began this morning with no associated falls, trauma, or injuries as cause for pain.  Pt denies cough, dysuria, hematuria, urgency.  LNMP in 2012.  Pt denies h/o similar pain, nephrolithiasis, pelvic disorders, ovarian cysts.  Pt is a current everyday smoker and reports occasional alcohol use.   History reviewed. No pertinent past medical history.  History reviewed. No pertinent past surgical history.  History reviewed. No pertinent family history.  History  Substance Use Topics  . Smoking status: Current Every Day Smoker -- 0.5 packs/day    Types: Cigarettes  . Smokeless tobacco: Not on file  . Alcohol Use: Yes     Comment: occasion    No OB history provided.   Review of Systems  Constitutional: Negative for fever and chills.  Gastrointestinal: Negative for nausea and vomiting.  Genitourinary: Positive for flank pain. Negative for dysuria, urgency and hematuria.    Allergies  Review of patient's allergies indicates no known allergies.  Home Medications   Current Outpatient Rx  Name  Route  Sig  Dispense  Refill  . HALLS COUGH DROPS MT   Mouth/Throat   Use as directed 1 lozenge in the mouth or throat as needed. For dry mouth           BP 129/71  Pulse 91  Temp 98.2 F (36.8 C) (Oral)  Resp 24  SpO2 98%  Physical Exam  Nursing note and  vitals reviewed. Constitutional: She is oriented to person, place, and time. She appears well-developed and well-nourished. No distress.  HENT:  Head: Normocephalic and atraumatic.  Eyes: Conjunctivae normal are normal.  Neck: Neck supple. No tracheal deviation present.  Cardiovascular: Normal rate, regular rhythm and normal heart sounds.   No murmur heard. Pulmonary/Chest: Effort normal and breath sounds normal. No respiratory distress. She has no wheezes.       Clear to auscultation bilaterally.  Abdominal: Soft. She exhibits no distension.  Musculoskeletal: Normal range of motion.       No worsening of tenderness to palpation of flank, no ecchymosis appreciated.  Neurological: She is alert and oriented to person, place, and time.  Skin: Skin is warm and dry.  Psychiatric: She has a normal mood and affect. Her behavior is normal.    ED Course  Procedures (including critical care time) DIAGNOSTIC STUDIES: Oxygen Saturation is 98% on room air, normal by my interpretation.    COORDINATION OF CARE: 4:55 PM- Patient informed of clinical course, understands medical decision-making process, and agrees with plan.   Dg Ribs Unilateral W/chest Left  11/30/2012  *RADIOLOGY REPORT*  Clinical Data: Left rib pain, no injury  LEFT RIBS AND CHEST - 3+ VIEW  Comparison: None.  Findings: The lungs are clear.  Mediastinal contours appear normal. Artifact over the left upper chest is consistent with the  patient's hair.  The heart is within normal limits in size.  No bony abnormality is seen.  Left rib detail films show no acute left rib fracture.  IMPRESSION: No active lung disease.  Negative left rib detail.   Original Report Authenticated By: Dwyane Dee, M.D.      No diagnosis found.    MDM  I personally performed the services described in this documentation, which was scribed in my presence. The recorded information has been reviewed and is accurate.  Pt comes in with cc of left sided flank  pain. Sudden onset. No UTI like sx. No associated nausea, chills etc to be concerned of renal stones. Pt's pain is pleuritic, and worse with movement. No tenderness with palpation. No signs of solid organ trauma.  Likely MS based on hx and exam.     Derwood Kaplan, MD 11/30/12 1742

## 2012-11-30 NOTE — ED Notes (Signed)
Pt c/o right side rib pain upon waking this am; pt sts pain is worse with inspiration and movement

## 2012-11-30 NOTE — ED Notes (Signed)
The pt has already had her xrays.  She does not appear to be in acute distress at present.  She is texting

## 2012-11-30 NOTE — ED Notes (Signed)
Pt knows we need a urine sample from her

## 2013-03-02 ENCOUNTER — Emergency Department (HOSPITAL_COMMUNITY)
Admission: EM | Admit: 2013-03-02 | Discharge: 2013-03-02 | Disposition: A | Discharge status: Home Health | Payer: Self-pay | Attending: Emergency Medicine | Admitting: Emergency Medicine

## 2013-03-02 ENCOUNTER — Encounter (HOSPITAL_COMMUNITY): Payer: Self-pay | Admitting: *Deleted

## 2013-03-02 DIAGNOSIS — F17200 Nicotine dependence, unspecified, uncomplicated: Secondary | ICD-10-CM | POA: Insufficient documentation

## 2013-03-02 DIAGNOSIS — R220 Localized swelling, mass and lump, head: Secondary | ICD-10-CM | POA: Insufficient documentation

## 2013-03-02 MED ORDER — DIPHENHYDRAMINE HCL 25 MG PO CAPS
50.0000 mg | ORAL_CAPSULE | Freq: Once | ORAL | Status: AC
  Administered 2013-03-02: 50 mg via ORAL
  Filled 2013-03-02: qty 2

## 2013-03-02 MED ORDER — CEPHALEXIN 250 MG PO CAPS: 250.0000 mg | ORAL_CAPSULE | Freq: Four times a day (QID) | ORAL | Status: DC

## 2013-03-02 MED ORDER — PREDNISONE 20 MG PO TABS
60.0000 mg | ORAL_TABLET | Freq: Once | ORAL | Status: AC
  Administered 2013-03-02: 60 mg via ORAL
  Filled 2013-03-02: qty 3

## 2013-03-02 MED ORDER — FAMOTIDINE 20 MG PO TABS
20.0000 mg | ORAL_TABLET | Freq: Once | ORAL | Status: AC
  Administered 2013-03-02: 20 mg via ORAL
  Filled 2013-03-02: qty 1

## 2013-03-02 NOTE — ED Notes (Signed)
PA at bedside.

## 2013-03-02 NOTE — ED Provider Notes (Signed)
History    This chart was scribed for non-physician practitioner Jaci Carrel, PA-C working with Raeford Razor, MD by Gerlean Ren, ED Scribe. This patient was seen in room TR05C/TR05C and the patient's care was started at 6:43 PM.   CSN: [id number]  Arrival date & time 03/02/13  1802   None     Chief Complaint  Patient presents with  . Urticaria  . Facial Swelling     The history is provided by the patient. No language interpreter was used.  Stephanie Reid is a 22 y.o. female who presents to the Emergency Department complaining of swelling over right superior eyelid.  Pt reports she woke up with itching hives over her entire face 3 days ago that resolved without intervention.  Pt reports right upper eyelid swelling was first noticed upon waking up yesterday morning that was worsened when waking up this morning causing the eye to be swollen shut.  Pt thinks mother may have changed detergents, but pt denies new lotions, soaps, antibiotics, eyeliner.  No known allergies.  Eye does not itch.  Pt denies dyspnea, tongue or throat swelling.  No recent illnesses.  No BP medication.         History reviewed. No pertinent past medical history.  History reviewed. No pertinent past surgical history.  No family history on file.  History  Substance Use Topics  . Smoking status: Current Every Day Smoker -- 0.50 packs/day    Types: Cigarettes  . Smokeless tobacco: Not on file  . Alcohol Use: Yes     Comment: occasion    No OB history provided.   Review of Systems  HENT: Positive for facial swelling. Negative for trouble swallowing.        Negative tongue swelling, negative throat swelling  Respiratory: Negative for choking and shortness of breath.        Negative dyspnea  Skin:       Urticaria, face  All other systems reviewed and are negative.    Allergies  Review of patient's allergies indicates no known allergies.  Home Medications   Current Outpatient Rx  Name  Route  Sig   Dispense  Refill  . HYDROcodone-acetaminophen (NORCO/VICODIN) 5-325 MG per tablet   Oral   Take 1 tablet by mouth every 6 (six) hours as needed for pain.   15 tablet   0   . ibuprofen (ADVIL,MOTRIN) 600 MG tablet   Oral   Take 1 tablet (600 mg total) by mouth every 6 (six) hours as needed for pain.   30 tablet   0   . Throat Lozenges (HALLS COUGH DROPS MT)   Mouth/Throat   Use as directed 1 lozenge in the mouth or throat as needed. For dry mouth           BP 110/70  Pulse 88  Temp(Src) 98.2 F (36.8 C) (Oral)  Resp 16  SpO2 97%  Physical Exam  Nursing note and vitals reviewed. Constitutional: She is oriented to person, place, and time. She appears well-developed and well-nourished. No distress.  HENT:  Head: Normocephalic and atraumatic.  Mouth/Throat: Oropharynx is clear and moist and mucous membranes are normal.  No sign of airway obstruction. No edema lips, tongue, uvula.Marland Kitchen Uvula midline, no nasal congestion or drooling.  Tongue not elevated. No trismus.  Eyes:  No pain w EOMs, Left superior & inferior eyelid swollen. No wheeping or purulent drainage  Neck: Trachea normal, normal range of motion and full passive range of motion without  pain. Neck supple. Carotid bruit is not present. No tracheal deviation present.  No carotid bruits or stridor  Cardiovascular: Normal rate, regular rhythm, intact distal pulses and normal pulses.   Not tachycardic  Pulmonary/Chest: Effort normal. No stridor.  Musculoskeletal: Normal range of motion.  Neurological: She is alert and oriented to person, place, and time.  Skin: Skin is warm and intact. She is not diaphoretic.  Not diaphoretic. no rash or urticaria   Psychiatric: She has a normal mood and affect. Her behavior is normal.    ED Course  Procedures (including critical care time) DIAGNOSTIC STUDIES: Oxygen Saturation is 97% on room air, adequate by my interpretation.    COORDINATION OF CARE: 6:47 PM- Informed pt that I  will discuss case with attending to determine best treatment plan.  Pt understands.    Labs Reviewed - No data to display No results found. Medications  famotidine (PEPCID) tablet 20 mg (20 mg Oral Given 03/02/13 1910)  diphenhydrAMINE (BENADRYL) capsule 50 mg (50 mg Oral Given 03/02/13 1910)  predniSONE (DELTASONE) tablet 60 mg (60 mg Oral Given 03/02/13 1910)     No diagnosis found.  7:53 PM- pt states she is feeling better, no worsened facial swelling. Will continue observation.   9:40 PM- swelling improved significantly. Strict return precautions discussed as well as need to follow up w allergist  BP 110/70  Pulse 88  Temp(Src) 98.2 F (36.8 C) (Oral)  Resp 16  SpO2 97%  MDM  Allergic reaction vs periorbital cellulitis.  Patient re-evaluated prior to dc, is hemodynamically stable, in no respiratory distress, and denies the feeling of throat closing. Pt has been advised to take OTC benadryl & return to the ED if they have a mod-severe allergic rxn (s/s including throat closing, difficulty breathing, swelling of lips face or tongue). Pt is to follow up with their PCP. Will also treat pt w abx as possible infectious source of eye swelling. Discussed w attending who agrees w tx plan.  Pt is agreeable with plan & verbalizes understanding.       I personally performed the services described in this documentation, which was scribed in my presence. The recorded information has been reviewed and is accurate.      Jaci Carrel, [place] 03/02/13 2142

## 2013-03-02 NOTE — ED Notes (Signed)
3 days ago pt awoke with hives.  Pt no longer has hives, but yesterday she began experiencing bil ear pain and swelling to her R eye.  No edema noted to oral airway.

## 2013-03-09 NOTE — ED Provider Notes (Signed)
Medical screening examination/treatment/procedure(s) were performed by non-physician practitioner and as supervising physician I was immediately available for consultation/collaboration.  Brynlei Klausner, MD 03/09/13 0825 

## 2013-03-27 ENCOUNTER — Emergency Department (HOSPITAL_COMMUNITY)
Admission: EM | Admit: 2013-03-27 | Discharge: 2013-03-27 | Disposition: A | Discharge status: Home / Self Care | Payer: Self-pay | Attending: Emergency Medicine | Admitting: Emergency Medicine

## 2013-03-27 ENCOUNTER — Encounter (HOSPITAL_COMMUNITY): Payer: Self-pay | Admitting: Emergency Medicine

## 2013-03-27 DIAGNOSIS — R05 Cough: Secondary | ICD-10-CM | POA: Insufficient documentation

## 2013-03-27 DIAGNOSIS — J302 Other seasonal allergic rhinitis: Secondary | ICD-10-CM

## 2013-03-27 DIAGNOSIS — H5789 Other specified disorders of eye and adnexa: Secondary | ICD-10-CM | POA: Insufficient documentation

## 2013-03-27 DIAGNOSIS — R6889 Other general symptoms and signs: Secondary | ICD-10-CM | POA: Insufficient documentation

## 2013-03-27 DIAGNOSIS — J3489 Other specified disorders of nose and nasal sinuses: Secondary | ICD-10-CM | POA: Insufficient documentation

## 2013-03-27 DIAGNOSIS — R059 Cough, unspecified: Secondary | ICD-10-CM | POA: Insufficient documentation

## 2013-03-27 DIAGNOSIS — J309 Allergic rhinitis, unspecified: Secondary | ICD-10-CM | POA: Insufficient documentation

## 2013-03-27 DIAGNOSIS — F17200 Nicotine dependence, unspecified, uncomplicated: Secondary | ICD-10-CM | POA: Insufficient documentation

## 2013-03-27 MED ORDER — LORATADINE 10 MG PO TABS: 10.0000 mg | ORAL_TABLET | Freq: Every day | ORAL | Status: DC

## 2013-03-27 MED ORDER — DEXAMETHASONE SODIUM PHOSPHATE 4 MG/ML IJ SOLN
8.0000 mg | Freq: Once | INTRAMUSCULAR | Status: AC
  Administered 2013-03-27: 8 mg via INTRAMUSCULAR
  Filled 2013-03-27: qty 2
  Filled 2013-03-27: qty 1

## 2013-03-27 MED ORDER — PREDNISONE 10 MG PO TABS: 20.0000 mg | ORAL_TABLET | Freq: Every day | ORAL | Status: DC

## 2013-03-27 NOTE — ED Provider Notes (Signed)
History     CSN: [id number]  Arrival date & time 03/27/13  1246   First MD Initiated Contact with Patient 03/27/13 1258      Chief Complaint  Patient presents with  . Allergies    (Consider location/radiation/quality/duration/timing/severity/associated sxs/prior treatment) HPI Patient is a 22 y.o AAF who presents to the Emergency department complaining of allergies.  Patient has had bilateral eye tearing, itching, redness, and swelling, nasal congestion and rhinorrhea, sneezing, and cough with white/yellow sputum.  Symptoms have worsened over the past month.  Patient has similar symptoms every spring and fall.  She has taken Zyrtec and claritin irregularly with minimal relief.  Benadryl is helpful but makes her too drowsy.  She states that Allegra D helped when she was younger.  She denies fever, sore throat, chest pain, and shortness of breath.    History reviewed. No pertinent past medical history.  History reviewed. No pertinent past surgical history.  History reviewed. No pertinent family history.  History  Substance Use Topics  . Smoking status: Current Every Day Smoker -- 0.50 packs/day    Types: Cigarettes  . Smokeless tobacco: Not on file  . Alcohol Use: Yes     Comment: occasion    OB History   Grav Para Term Preterm Abortions TAB SAB Ect Mult Living                  Review of Systems All other systems negative except as documented in the HPI. All pertinent positives and negatives as reviewed in the HPI.   Allergies  Review of patient's allergies indicates no known allergies.  Home Medications   Current Outpatient Rx  Name  Route  Sig  Dispense  Refill  . loratadine (CLARITIN) 10 MG tablet   Oral   Take 1 tablet (10 mg total) by mouth daily. One po daily x 5 days   30 tablet   0   . predniSONE (DELTASONE) 10 MG tablet   Oral   Take 2 tablets (20 mg total) by mouth daily.   10 tablet   0     BP 114/59  Pulse 77  Temp(Src) 98.1 F (36.7 C)  (Oral)  Resp 16  SpO2 99%  Physical Exam  Constitutional: She is oriented to person, place, and time. She appears well-developed and well-nourished. No distress.  HENT:  Head: Normocephalic and atraumatic.  Right Ear: Tympanic membrane and external ear normal.  Left Ear: Tympanic membrane and external ear normal.  Nose: Mucosal edema and rhinorrhea present. Right sinus exhibits no maxillary sinus tenderness and no frontal sinus tenderness. Left sinus exhibits no maxillary sinus tenderness and no frontal sinus tenderness.  Mouth/Throat: Uvula is midline and oropharynx is clear and moist. No oropharyngeal exudate.  Eyes: Conjunctivae are normal. Pupils are equal, round, and reactive to light. Right eye exhibits normal extraocular motion. Left eye exhibits normal extraocular motion.  Lids slightly edematous bilaterally  Cardiovascular: Normal rate, regular rhythm, normal heart sounds and intact distal pulses.  Exam reveals no gallop and no friction rub.   No murmur heard. Pulmonary/Chest: Effort normal and breath sounds normal. No respiratory distress. She has no wheezes. She exhibits no tenderness.  Abdominal: Soft. Bowel sounds are normal. There is no tenderness.  Neurological: She is alert and oriented to person, place, and time.  Skin: Skin is warm and dry.    ED Course  Procedures (including critical care time)  Labs Reviewed - No data to display No results found.  1. Seasonal allergies       MDM          Carlyle Dolly, PA-C 03/28/13 1244

## 2013-03-27 NOTE — ED Notes (Signed)
Pt c/o congestion, post nasal drip and dry cough

## 2013-03-28 NOTE — ED Provider Notes (Signed)
Medical screening examination/treatment/procedure(s) were conducted as a shared visit with non-physician practitioner(s) and myself.  I personally evaluated the patient during the encounter  No oral swelling, SOB, wheezing.   Loren Racer, MD 03/28/13 1538

## 2013-06-13 ENCOUNTER — Emergency Department (HOSPITAL_COMMUNITY)
Admission: EM | Admit: 2013-06-13 | Discharge: 2013-06-13 | Disposition: A | Discharge status: Home / Self Care | Payer: Self-pay | Attending: Emergency Medicine | Admitting: Emergency Medicine

## 2013-06-13 ENCOUNTER — Encounter (HOSPITAL_COMMUNITY): Payer: Self-pay | Admitting: Emergency Medicine

## 2013-06-13 DIAGNOSIS — IMO0002 Reserved for concepts with insufficient information to code with codable children: Secondary | ICD-10-CM | POA: Insufficient documentation

## 2013-06-13 DIAGNOSIS — L989 Disorder of the skin and subcutaneous tissue, unspecified: Secondary | ICD-10-CM | POA: Insufficient documentation

## 2013-06-13 DIAGNOSIS — L732 Hidradenitis suppurativa: Secondary | ICD-10-CM | POA: Insufficient documentation

## 2013-06-13 DIAGNOSIS — F17200 Nicotine dependence, unspecified, uncomplicated: Secondary | ICD-10-CM | POA: Insufficient documentation

## 2013-06-13 MED ORDER — IBUPROFEN 400 MG PO TABS
600.0000 mg | ORAL_TABLET | Freq: Once | ORAL | Status: AC
  Administered 2013-06-13: 600 mg via ORAL
  Filled 2013-06-13: qty 1

## 2013-06-13 MED ORDER — IBUPROFEN 600 MG PO TABS: 600.0000 mg | ORAL_TABLET | Freq: Four times a day (QID) | ORAL | Status: DC | PRN

## 2013-06-13 MED ORDER — CEPHALEXIN 250 MG PO CAPS
250.0000 mg | ORAL_CAPSULE | Freq: Once | ORAL | Status: AC
  Administered 2013-06-13: 250 mg via ORAL
  Filled 2013-06-13: qty 1

## 2013-06-13 MED ORDER — CEPHALEXIN 250 MG PO CAPS: 250.0000 mg | ORAL_CAPSULE | Freq: Four times a day (QID) | ORAL | Status: DC

## 2013-06-13 NOTE — ED Provider Notes (Signed)
History    CSN: [id number] Arrival date & time 06/13/13  2241  First MD Initiated Contact with Patient 06/13/13 2326     Chief Complaint  Patient presents with  . Abscess   (Consider location/radiation/quality/duration/timing/severity/associated sxs/prior Treatment) HPI Comments: Patient states that she has not had a menstral cycle since age 22 but on a monthly basis she will developed abscess in her axilla that are small painful and drain spontaneously Now with same for the past 2 days She also noted a small lesion on the buttock L cheek in gluteal fold   Patient is a 22 y.o. female presenting with abscess. The history is provided by the patient.  Abscess Location:  Shoulder/arm Shoulder/arm abscess location:  L axilla and R axilla Abscess quality: draining and redness   Red streaking: no   Duration:  2 days Progression:  Unchanged Chronicity:  Recurrent Relieved by:  None tried Worsened by:  Nothing tried Ineffective treatments:  None tried Associated symptoms: no fever   Risk factors: prior abscess    History reviewed. No pertinent past medical history. History reviewed. No pertinent past surgical history. No family history on file. History  Substance Use Topics  . Smoking status: Current Every Day Smoker -- 0.50 packs/day    Types: Cigarettes  . Smokeless tobacco: Not on file  . Alcohol Use: Yes     Comment: occasion   OB History   Grav Para Term Preterm Abortions TAB SAB Ect Mult Living                 Review of Systems  Constitutional: Negative for fever and chills.  Skin: Positive for wound.  All other systems reviewed and are negative.    Allergies  Review of patient's allergies indicates no known allergies.  Home Medications   Current Outpatient Rx  Name  Route  Sig  Dispense  Refill  . cephALEXin (KEFLEX) 250 MG capsule   Oral   Take 1 capsule (250 mg total) by mouth 4 (four) times daily.   39 capsule   0   . ibuprofen (ADVIL,MOTRIN) 600 MG  tablet   Oral   Take 1 tablet (600 mg total) by mouth every 6 (six) hours as needed for pain.   30 tablet   0   . loratadine (CLARITIN) 10 MG tablet   Oral   Take 1 tablet (10 mg total) by mouth daily. One po daily x 5 days   30 tablet   0   . predniSONE (DELTASONE) 10 MG tablet   Oral   Take 2 tablets (20 mg total) by mouth daily.   10 tablet   0    BP 120/66  Pulse 82  Temp(Src) 98.2 F (36.8 C) (Oral)  Resp 16  Ht 5\' 6"  (1.676 m)  Wt 200 lb (90.719 kg)  BMI 32.3 kg/m2  SpO2 98% Physical Exam  Nursing note reviewed. Constitutional: She is oriented to person, place, and time. She appears well-developed and well-nourished.  HENT:  Head: Normocephalic.  Eyes: Pupils are equal, round, and reactive to light.  Neck: Normal range of motion.  Cardiovascular: Normal rate and regular rhythm.   Pulmonary/Chest: Effort normal and breath sounds normal.  Musculoskeletal: She exhibits tenderness. She exhibits no edema.  Neurological: She is alert and oriented to person, place, and time.  Skin: Skin is warm.       ED Course  Procedures (including critical care time) Labs Reviewed - No data to display No results found.  1. Hydradenitis     MDM  None of the abscess need to be drained at this time Will treat with Keflex and Ibuprofen referred to [facility] for further treatment   Arman Filter, NP 06/13/13 2342

## 2013-06-13 NOTE — ED Notes (Signed)
C/o "boils" to bilateral axilla and buttocks x approx 10 days.  Reports white drainage.

## 2013-06-14 ENCOUNTER — Telehealth (HOSPITAL_COMMUNITY): Payer: Self-pay | Admitting: *Deleted

## 2013-06-14 NOTE — ED Provider Notes (Signed)
Medical screening examination/treatment/procedure(s) were performed by non-physician practitioner and as supervising physician I was immediately available for consultation/collaboration.  Aisley Whan M Tujuana Kilmartin, MD 06/14/13 0646 

## 2013-07-05 ENCOUNTER — Encounter: Payer: Self-pay | Admitting: Obstetrics & Gynecology

## 2013-07-23 ENCOUNTER — Encounter: Payer: Self-pay | Admitting: *Deleted

## 2013-08-09 ENCOUNTER — Encounter: Payer: Self-pay | Admitting: *Deleted

## 2013-08-12 ENCOUNTER — Encounter (HOSPITAL_COMMUNITY): Payer: Self-pay | Admitting: Emergency Medicine

## 2013-08-12 DIAGNOSIS — IMO0001 Reserved for inherently not codable concepts without codable children: Secondary | ICD-10-CM | POA: Insufficient documentation

## 2013-08-12 DIAGNOSIS — J069 Acute upper respiratory infection, unspecified: Secondary | ICD-10-CM | POA: Insufficient documentation

## 2013-08-12 DIAGNOSIS — Z3202 Encounter for pregnancy test, result negative: Secondary | ICD-10-CM | POA: Insufficient documentation

## 2013-08-12 DIAGNOSIS — M545 Low back pain, unspecified: Secondary | ICD-10-CM | POA: Insufficient documentation

## 2013-08-12 DIAGNOSIS — F17200 Nicotine dependence, unspecified, uncomplicated: Secondary | ICD-10-CM | POA: Insufficient documentation

## 2013-08-12 DIAGNOSIS — Z79899 Other long term (current) drug therapy: Secondary | ICD-10-CM | POA: Insufficient documentation

## 2013-08-12 DIAGNOSIS — R112 Nausea with vomiting, unspecified: Secondary | ICD-10-CM | POA: Insufficient documentation

## 2013-08-12 NOTE — ED Notes (Signed)
Pt. reports generalized body aches , productive cough , chest congestion , left flank pain and nausea/vomitting onset yesterday .

## 2013-08-13 ENCOUNTER — Emergency Department (HOSPITAL_COMMUNITY): Payer: Self-pay

## 2013-08-13 ENCOUNTER — Emergency Department (HOSPITAL_COMMUNITY)
Admission: EM | Admit: 2013-08-13 | Discharge: 2013-08-13 | Disposition: A | Discharge status: Home / Self Care | Payer: Self-pay | Attending: Emergency Medicine | Admitting: Emergency Medicine

## 2013-08-13 DIAGNOSIS — J069 Acute upper respiratory infection, unspecified: Secondary | ICD-10-CM

## 2013-08-13 LAB — URINALYSIS, ROUTINE W REFLEX MICROSCOPIC
Glucose, UA: NEGATIVE mg/dL
Hgb urine dipstick: NEGATIVE
Ketones, ur: NEGATIVE mg/dL
Protein, ur: NEGATIVE mg/dL
pH: 7 (ref 5.0–8.0)

## 2013-08-13 MED ORDER — ACETAMINOPHEN 325 MG PO TABS
650.0000 mg | ORAL_TABLET | Freq: Once | ORAL | Status: AC
  Administered 2013-08-13: 650 mg via ORAL

## 2013-08-13 MED ORDER — ALBUTEROL SULFATE HFA 108 (90 BASE) MCG/ACT IN AERS
2.0000 | INHALATION_SPRAY | RESPIRATORY_TRACT | Status: DC | PRN
  Administered 2013-08-13: 2 via RESPIRATORY_TRACT
  Filled 2013-08-13: qty 6.7

## 2013-08-13 MED ORDER — ONDANSETRON 4 MG PO TBDP
4.0000 mg | ORAL_TABLET | Freq: Once | ORAL | Status: AC
  Administered 2013-08-13: 4 mg via ORAL
  Filled 2013-08-13: qty 1

## 2013-08-13 MED ORDER — ALBUTEROL SULFATE HFA 108 (90 BASE) MCG/ACT IN AERS: 1.0000 | INHALATION_SPRAY | Freq: Four times a day (QID) | RESPIRATORY_TRACT | Status: DC | PRN

## 2013-08-13 MED ORDER — BENZONATATE 100 MG PO CAPS: 100.0000 mg | ORAL_CAPSULE | Freq: Three times a day (TID) | ORAL | Status: DC

## 2013-08-13 MED ORDER — IBUPROFEN 800 MG PO TABS: 800.0000 mg | ORAL_TABLET | Freq: Three times a day (TID) | ORAL | Status: DC

## 2013-08-13 MED ORDER — IBUPROFEN 800 MG PO TABS
800.0000 mg | ORAL_TABLET | Freq: Once | ORAL | Status: AC
  Administered 2013-08-13: 800 mg via ORAL
  Filled 2013-08-13: qty 1

## 2013-08-13 MED ORDER — OXYMETAZOLINE HCL 0.05 % NA SOLN
1.0000 | Freq: Once | NASAL | Status: AC
  Administered 2013-08-13: 1 via NASAL
  Filled 2013-08-13: qty 15

## 2013-08-13 MED ORDER — BENZONATATE 100 MG PO CAPS
100.0000 mg | ORAL_CAPSULE | Freq: Once | ORAL | Status: AC
  Administered 2013-08-13: 100 mg via ORAL
  Filled 2013-08-13: qty 1

## 2013-08-13 NOTE — ED Notes (Signed)
Wednesday at noon, started to feel sick, stopped up nose, runny nose, vomited once white foamy appearance

## 2013-08-13 NOTE — ED Provider Notes (Signed)
CSN: [id number]     Arrival date & time 08/12/13  2009 History   First MD Initiated Contact with Patient 08/13/13 0034     Chief Complaint  Patient presents with  . Generalized Body Aches  . Cough   (Consider location/radiation/quality/duration/timing/severity/associated sxs/prior Treatment) HPI History provided by the patient. Onset 2 days ago of cough cold and congestion. She has some associated lower back discomfort. No unilateral flank pain. No dysuria, urgency or frequency. No hematuria. No hemoptysis. She has some nausea and vomiting times one yesterday - this was after a coughing spell. No known sick contacts. No recent travel. No history of asthma. Symptoms moderate in severity. History reviewed. No pertinent past medical history. History reviewed. No pertinent past surgical history. No family history on file. History  Substance Use Topics  . Smoking status: Current Every Day Smoker -- 0.50 packs/day    Types: Cigarettes  . Smokeless tobacco: Not on file  . Alcohol Use: Yes     Comment: occasion   OB History   Grav Para Term Preterm Abortions TAB SAB Ect Mult Living                 Review of Systems  Constitutional: Negative for fever and chills.  HENT: Negative for neck pain and neck stiffness.   Eyes: Negative for pain.  Respiratory: Negative for shortness of breath.   Cardiovascular: Negative for chest pain.  Gastrointestinal: Negative for abdominal pain.  Genitourinary: Negative for dysuria.  Musculoskeletal: Positive for myalgias and back pain.  Skin: Negative for rash.  Neurological: Negative for headaches.  All other systems reviewed and are negative.    Allergies  Review of patient's allergies indicates no known allergies.  Home Medications   Current Outpatient Rx  Name  Route  Sig  Dispense  Refill  . Pseudoeph-Doxylamine-DM-APAP (NYQUIL PO)   Oral   Take 2 tablets by mouth at bedtime as needed (cold).         Marland Kitchen albuterol (PROVENTIL HFA;VENTOLIN  HFA) 108 (90 BASE) MCG/ACT inhaler   Inhalation   Inhale 1-2 puffs into the lungs every 6 (six) hours as needed for wheezing.   1 Inhaler   0   . benzonatate (TESSALON) 100 MG capsule   Oral   Take 1 capsule (100 mg total) by mouth every 8 (eight) hours.   21 capsule   0   . cephALEXin (KEFLEX) 250 MG capsule   Oral   Take 1 capsule (250 mg total) by mouth 4 (four) times daily.   39 capsule   0   . ibuprofen (ADVIL,MOTRIN) 600 MG tablet   Oral   Take 1 tablet (600 mg total) by mouth every 6 (six) hours as needed for pain.   30 tablet   0   . ibuprofen (ADVIL,MOTRIN) 800 MG tablet   Oral   Take 1 tablet (800 mg total) by mouth 3 (three) times daily.   21 tablet   0    BP 121/70  Pulse 93  Temp(Src) 97.9 F (36.6 C) (Oral)  Resp 19  SpO2 100% Physical Exam  Constitutional: She is oriented to person, place, and time. She appears well-developed and well-nourished.  HENT:  Head: Normocephalic and atraumatic.  Mouth/Throat: Oropharynx is clear and moist. No oropharyngeal exudate.   Nasal congestion  Eyes: EOM are normal. Pupils are equal, round, and reactive to light. Right eye exhibits no discharge. Left eye exhibits no discharge.  Neck: Neck supple.  Cardiovascular: Normal rate, regular rhythm  and intact distal pulses.   Pulmonary/Chest: Effort normal and breath sounds normal. No stridor. No respiratory distress. She exhibits no tenderness.  Some dry cough during exam  Abdominal: Soft. Bowel sounds are normal. She exhibits no distension. There is no tenderness.  Musculoskeletal: Normal range of motion. She exhibits no edema.  Lymphadenopathy:    She has no cervical adenopathy.  Neurological: She is alert and oriented to person, place, and time.  Skin: Skin is warm and dry.    ED Course  Procedures (including critical care time) Labs Review Labs Reviewed  URINALYSIS, ROUTINE W REFLEX MICROSCOPIC  PREGNANCY, URINE   Imaging Review Dg Chest 2 View  08/13/2013    CLINICAL DATA:  Cough and fever  EXAM: CHEST  2 VIEW  COMPARISON:  11/30/2012  FINDINGS: The heart size and mediastinal contours are within normal limits. Both lungs are clear. The visualized skeletal structures are unremarkable.  IMPRESSION: No active cardiopulmonary disease.   Electronically Signed   By: Tiburcio Pea   On: 08/13/2013 01:09   Symptomatically improved with albuterol and Tessalon. Tylenol Motrin provided.  Plan discharge home with URI precautions verbalizes understood. Prescription for Tessalon, Motrin and albuterol provided. Afrin as needed for nasal congestion twice daily for 3 days.   Outpatient referral for followup. Stable for discharge home. Anticipatory guidance provided  MDM   1. URI (upper respiratory infection)    Chest x-ray and urinalysis reviewed - no infiltrate and no UTI  Improved with medications  Vital signs and nursing notes reviewed and considered.    Sunnie Nielsen, MD 08/13/13 [phone]

## 2013-08-16 ENCOUNTER — Encounter: Payer: Self-pay | Admitting: Nurse Practitioner

## 2013-09-01 ENCOUNTER — Encounter: Payer: Self-pay | Admitting: Family Medicine

## 2013-10-06 ENCOUNTER — Encounter: Payer: Self-pay | Admitting: Obstetrics and Gynecology

## 2013-10-06 ENCOUNTER — Ambulatory Visit (INDEPENDENT_AMBULATORY_CARE_PROVIDER_SITE_OTHER): Payer: Self-pay | Admitting: Obstetrics and Gynecology

## 2013-10-06 VITALS — BP 114/78 | HR 98 | Temp 98.1°F | Ht 66.0 in | Wt 190.9 lb

## 2013-10-06 DIAGNOSIS — N912 Amenorrhea, unspecified: Secondary | ICD-10-CM

## 2013-10-06 DIAGNOSIS — N911 Secondary amenorrhea: Secondary | ICD-10-CM

## 2013-10-06 DIAGNOSIS — Z01419 Encounter for gynecological examination (general) (routine) without abnormal findings: Secondary | ICD-10-CM

## 2013-10-06 MED ORDER — MEDROXYPROGESTERONE ACETATE 10 MG PO TABS: 20.0000 mg | ORAL_TABLET | Freq: Every day | ORAL | Status: DC

## 2013-10-06 NOTE — Patient Instructions (Signed)
Secondary Amenorrhea   Secondary amenorrhea is the stopping of menstrual flow for 3 6 months in a female who has previously had periods. There are many possible causes. Most of these causes are not serious. Usually, treating the underlying problem causing the loss of menses will return your periods to normal.  CAUSES   Some common and uncommon causes of not menstruating include:   Malnutrition.   Low blood sugar (hypoglycemia).   Polycystic ovary disease.   Stress or fear.   Breastfeeding.   Hormone imbalance.   Ovarian failure.   Medicines.   Extreme obesity.   Cystic fibrosis.   Low body weight or drastic weight reduction from any cause.   Early menopause.   Removal of ovaries or uterus.   Contraceptives.   Illness.   Long-term (chronic) illnesses.   Cushing syndrome.   Thyroid problems.   Birth control pills, patches, or vaginal rings for birth control.  RISK FACTORS  You may be at greater risk of secondary amenorrhea if:   You have a family history of this condition.   You have an eating disorder.   You do athletic training.  DIAGNOSIS   A diagnosis is made by your health care provider taking a medical history and doing a physical exam. This will include a pelvic exam to check for problems with your reproductive organs. Pregnancy must be ruled out. Often, numerous blood tests are done to measure different hormones in the body. Urine testing may be done. Specialized exams (ultrasound, CT scan, MRI, or hysteroscopy) may have to be done as well as measuring the body mass index (BMI).  TREATMENT   Treatment depends on the cause of the amenorrhea. If an eating disorder is present, this can be treated with an adequate diet and therapy. Chronic illnesses may improve with treatment of the illness. Amenorrhea may be corrected with medicines, lifestyle changes, or surgery. If the amenorrhea cannot be corrected, it is sometimes possible to create a false menstruation with medicines.  HOME CARE  INSTRUCTIONS   Maintain a healthy diet.   Manage weight problems.   Exercise regularly but not excessively.   Get adequate sleep.   Manage stress.   Be aware of changes in your menstrual cycle. Keep a record of when your periods occur. Note the date your period starts, how long it lasts, and any problems.  SEEK MEDICAL CARE IF:  Your symptoms do not get better with treatment.  Document Released: 12/23/2006 Document Revised: 07/14/2013 Document Reviewed: 04/29/2013  Exit[employer] Patient Information 2014 [employer].

## 2013-10-06 NOTE — Progress Notes (Signed)
CC: Amenorrhea     HPI Stephanie Reid is a 22 y.o. nulligravida who presents with amenorrhea since LMP Jan 2012. Menarche 11 with monthly cycles. OCPs age 50-19. Age 46-21 menses interval was 3 months, duration 4d, heavy with cramps.  Attempting/desiring pregnancy.  ROS: Endorses weight gain. Denies depression, hair thinning, temperature intolerance, hirsutism, nipple discharge.  History reviewed. No pertinent past medical history.  OB History  No data available    History reviewed. No pertinent past surgical history.  History   Social History  . Marital Status: Single    Spouse Name: N/A    Number of Children: N/A  . Years of Education: N/A   Occupational History  . Not on file.   Social History Main Topics  . Smoking status: Current Every Day Smoker -- 0.25 packs/day    Types: Cigarettes  . Smokeless tobacco: Never Used  . Alcohol Use: Yes     Comment: occasion  . Drug Use: No  . Sexual Activity: Yes    Birth Control/ Protection: None   Other Topics Concern  . Not on file   Social History Narrative  . No narrative on file    Current Outpatient Prescriptions on File Prior to Visit  Medication Sig Dispense Refill  . albuterol (PROVENTIL HFA;VENTOLIN HFA) 108 (90 BASE) MCG/ACT inhaler Inhale 1-2 puffs into the lungs every 6 (six) hours as needed for wheezing.  1 Inhaler  0  . benzonatate (TESSALON) 100 MG capsule Take 1 capsule (100 mg total) by mouth every 8 (eight) hours.  21 capsule  0  . ibuprofen (ADVIL,MOTRIN) 600 MG tablet Take 1 tablet (600 mg total) by mouth every 6 (six) hours as needed for pain.  30 tablet  0  . ibuprofen (ADVIL,MOTRIN) 800 MG tablet Take 1 tablet (800 mg total) by mouth 3 (three) times daily.  21 tablet  0  . Pseudoeph-Doxylamine-DM-APAP (NYQUIL PO) Take 2 tablets by mouth at bedtime as needed (cold).       No current facility-administered medications on file prior to visit.    No Known Allergies  ROS Pertinent items in  HPI  PHYSICAL EXAM Filed Vitals:   10/06/13 1519  BP: 114/78  Pulse: 98  Temp: 98.1 F (36.7 C)   General: Mildly obese female in no acute distress Skin: No hirsutism noted Cardiovascular: Normal rate Respiratory: Normal effort Abdomen: Soft, nontender Back: No CVAT Extremities: No edema Neurologic: Alert and oriented Speculum exam: NEFG; vagina with physiologic discharge, no blood; cervix clean Bimanual exam: cervix closed, no CMT; uterus NSSP; no adnexal tenderness or masses    ASSESSMENT  1. Well woman exam   2. Amenorrhea, secondary   Obesity, mild  PLAN D/W Dr. Debroah Loop FSH, prolactin, LH, TSH, UPT done. Rx Provera 20 mg qd x 10 d AVS on amenorrhea, explained probably anovulatory F/U 2-3 wks. for results Danae Orleans, CNM 10/06/2013 4:45 PM        Stephanie Reid Colin Mulders, CNM 10/06/2013 4:38 PM

## 2013-10-06 NOTE — Progress Notes (Signed)
Pt. States she hasn't gotten her period in 2.5 years. Was put on "something that starts with a P for 10 days"  in august and has still not gotten her period.  Pt. States she was 11 when she first got her period, they were regular and lasted 4 days. Went on birth control at age 22, came off of it at 15, periods came back but were in frequent (once every 3 months) and then stopped all together.

## 2013-10-07 LAB — FOLLICLE STIMULATING HORMONE: FSH: 68.7 m[IU]/mL

## 2013-10-07 LAB — LUTEINIZING HORMONE: LH: 31 m[IU]/mL

## 2013-10-07 LAB — TSH: TSH: 1.023 u[IU]/mL (ref 0.350–4.500)

## 2013-11-10 ENCOUNTER — Telehealth: Payer: Self-pay | Admitting: General Practice

## 2013-11-10 NOTE — Telephone Encounter (Signed)
Patient called back in returning phone call, told patient I was attempting to return her phone call from earlier and that looking through her chart it looks like she was supposed to follow up with [place] a couple weeks ago but nothing got scheduled. Told patient I made an appt for her to come in and discuss everything with a provider and to review her blood work with her at that time and they can determine a treatment plan from there. Informed patient of 1/5 @ 1pm appt. Patient verbalized understanding to all and had no further questions

## 2013-11-10 NOTE — Telephone Encounter (Signed)
Patient called and left message stating she has been waiting for a phone call, she still has not got her period even after taking the medication and can be reached at [phone]. Per chart review, patient was supposed to follow up in 2-3 weeks from 11/12 appt, appt never got made. Patient needs to come in to discuss test results. appt made for 1/5 @ 1pm, important that patient comes in. Called patient at requested number and her mother answered stating she was not in at the moment, asked her to tell the patient to call us back, she stated that she would.

## 2013-11-17 ENCOUNTER — Emergency Department (HOSPITAL_COMMUNITY)
Admission: EM | Admit: 2013-11-17 | Discharge: 2013-11-17 | Discharge status: Against Medical Advice | Payer: Self-pay | Attending: Emergency Medicine | Admitting: Emergency Medicine

## 2013-11-17 ENCOUNTER — Encounter (HOSPITAL_COMMUNITY): Payer: Self-pay | Admitting: Emergency Medicine

## 2013-11-17 DIAGNOSIS — M79609 Pain in unspecified limb: Secondary | ICD-10-CM | POA: Insufficient documentation

## 2013-11-17 DIAGNOSIS — F17200 Nicotine dependence, unspecified, uncomplicated: Secondary | ICD-10-CM | POA: Insufficient documentation

## 2013-11-17 NOTE — ED Notes (Signed)
Checked multiple times to see if pt in room but pt is not in room. Left before being seen.

## 2013-11-17 NOTE — ED Provider Notes (Signed)
The patient was not in the room when I went to see her. Delay in being seen due to high acuity census in the department.   Arnoldo Hooker, PA-C 11/17/13 0725

## 2013-11-17 NOTE — ED Notes (Signed)
The pt is c/o lt arm pain  With cramps since yesterday.  No known inkury

## 2013-11-19 NOTE — ED Provider Notes (Signed)
Medical screening examination/treatment/procedure(s) were performed by non-physician practitioner and as supervising physician I was immediately available for consultation/collaboration.   Beatrix Breece, MD 11/19/13 0312 

## 2013-11-29 ENCOUNTER — Ambulatory Visit: Payer: Self-pay | Admitting: Obstetrics and Gynecology

## 2013-12-20 ENCOUNTER — Encounter: Payer: Self-pay | Admitting: Obstetrics and Gynecology

## 2013-12-20 ENCOUNTER — Ambulatory Visit (INDEPENDENT_AMBULATORY_CARE_PROVIDER_SITE_OTHER): Payer: Self-pay | Admitting: Obstetrics and Gynecology

## 2013-12-20 VITALS — BP 113/78 | HR 75 | Temp 98.0°F | Ht 66.0 in | Wt 198.6 lb

## 2013-12-20 DIAGNOSIS — E288 Other ovarian dysfunction: Secondary | ICD-10-CM

## 2013-12-20 DIAGNOSIS — E2839 Other primary ovarian failure: Secondary | ICD-10-CM

## 2013-12-20 NOTE — Progress Notes (Signed)
Patient ID: Stephanie Reid, female   DOB: Oct 12, 1991, 23 y.o.   MRN: [id number] 23 yo G0 with secondary amenorrhea for nearly 3 years presenting today to discuss results and follow up following progesterone challenge test. Patient states she took the medication as prescribed in August and never experienced a period. She is trying to get pregnancy which is what prompted to start care for this issue in this past.  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 Advised to complete financial assistance packet as well - RTC for annual exam or prn

## 2014-03-08 ENCOUNTER — Telehealth: Payer: Self-pay | Admitting: General Practice

## 2014-03-08 NOTE — Telephone Encounter (Signed)
Called patient to follow up to see if she went to her appt with Stephanie Reid. Patient stated they wanted $185 at the first visit and she doesn't have that money or insurance. Told patient the referral is still in place so if she gets the money or insurance she can call them back to reschedule. Patient verbalized understanding and had no other questions

## 2014-03-27 ENCOUNTER — Encounter (HOSPITAL_COMMUNITY): Payer: Self-pay | Admitting: Emergency Medicine

## 2014-03-27 DIAGNOSIS — A5901 Trichomonal vulvovaginitis: Secondary | ICD-10-CM | POA: Insufficient documentation

## 2014-03-27 DIAGNOSIS — F17200 Nicotine dependence, unspecified, uncomplicated: Secondary | ICD-10-CM | POA: Insufficient documentation

## 2014-03-27 DIAGNOSIS — Z3202 Encounter for pregnancy test, result negative: Secondary | ICD-10-CM | POA: Insufficient documentation

## 2014-03-27 NOTE — ED Notes (Signed)
The pt is here for a poss std.  She has had a vaginal discharge for the past 3 days. No itching but has an odor.  lmp  Almost 3 years she never has periods

## 2014-03-27 NOTE — ED Notes (Signed)
The pt says she cannot void

## 2014-03-28 ENCOUNTER — Emergency Department (HOSPITAL_COMMUNITY)
Admission: EM | Admit: 2014-03-28 | Discharge: 2014-03-28 | Disposition: A | Discharge status: Home / Self Care | Payer: Self-pay | Attending: Emergency Medicine | Admitting: Emergency Medicine

## 2014-03-28 DIAGNOSIS — A599 Trichomoniasis, unspecified: Secondary | ICD-10-CM

## 2014-03-28 LAB — WET PREP, GENITAL: YEAST WET PREP: NONE SEEN

## 2014-03-28 LAB — URINALYSIS, ROUTINE W REFLEX MICROSCOPIC
BILIRUBIN URINE: NEGATIVE
GLUCOSE, UA: NEGATIVE mg/dL
Hgb urine dipstick: NEGATIVE
Ketones, ur: NEGATIVE mg/dL
NITRITE: NEGATIVE
PH: 6.5 (ref 5.0–8.0)
Protein, ur: NEGATIVE mg/dL
SPECIFIC GRAVITY, URINE: 1.019 (ref 1.005–1.030)
Urobilinogen, UA: 1 mg/dL (ref 0.0–1.0)

## 2014-03-28 LAB — HIV ANTIBODY (ROUTINE TESTING W REFLEX): HIV 1&2 Ab, 4th Generation: NONREACTIVE

## 2014-03-28 LAB — URINE MICROSCOPIC-ADD ON

## 2014-03-28 LAB — POC URINE PREG, ED: Preg Test, Ur: NEGATIVE

## 2014-03-28 LAB — GC/CHLAMYDIA PROBE AMP
CT PROBE, AMP APTIMA: NEGATIVE
GC Probe RNA: NEGATIVE

## 2014-03-28 LAB — RPR

## 2014-03-28 MED ORDER — CEFTRIAXONE SODIUM 250 MG IJ SOLR
250.0000 mg | Freq: Once | INTRAMUSCULAR | Status: AC
  Administered 2014-03-28: 250 mg via INTRAMUSCULAR
  Filled 2014-03-28: qty 250

## 2014-03-28 MED ORDER — METRONIDAZOLE 500 MG PO TABS
2000.0000 mg | ORAL_TABLET | Freq: Once | ORAL | Status: AC
  Administered 2014-03-28: 2000 mg via ORAL
  Filled 2014-03-28: qty 4

## 2014-03-28 MED ORDER — LIDOCAINE HCL (PF) 1 % IJ SOLN
INTRAMUSCULAR | Status: AC
  Administered 2014-03-28: 5 mL
  Filled 2014-03-28: qty 5

## 2014-03-28 MED ORDER — AZITHROMYCIN 250 MG PO TABS
1000.0000 mg | ORAL_TABLET | Freq: Once | ORAL | Status: AC
  Administered 2014-03-28: 1000 mg via ORAL
  Filled 2014-03-28: qty 4

## 2014-03-28 NOTE — ED Notes (Signed)
C/o vaginal discharge for four days, last date of intercourse five days ago.

## 2014-03-28 NOTE — ED Provider Notes (Signed)
CSN: [id number]     Arrival date & time 03/27/14  2220 History   First MD Initiated Contact with Patient 03/28/14 0059     Chief Complaint  Patient presents with  . Vaginal Discharge     (Consider location/radiation/quality/duration/timing/severity/associated sxs/prior Treatment) HPI Provided by patient. Complains of green vaginal discharge for last 3 days, has history of gonorrhea and Chlamydia. She denies any fevers, abdominal pain, or sore throat. Her significant other was evaluated earlier today and treated for possible STD. She is requesting evaluation and treatment for the same.  Symptoms moderate in severity. LMP years ago, no BCPs. She does not follow up with OB/GYN. No dysuria or back pain. History reviewed. No pertinent past medical history. History reviewed. No pertinent past surgical history. Family History  Problem Relation Age of Onset  . Hypertension Mother   . Heart disease Mother   . Stroke Mother   . Diabetes Maternal Grandmother    History  Substance Use Topics  . Smoking status: Current Every Day Smoker -- 0.25 packs/day    Types: Cigarettes  . Smokeless tobacco: Never Used  . Alcohol Use: Yes     Comment: occasion   OB History   Grav Para Term Preterm Abortions TAB SAB Ect Mult Living                 Review of Systems  Constitutional: Negative for fever and chills.  Respiratory: Negative for shortness of breath.   Cardiovascular: Negative for chest pain.  Gastrointestinal: Negative for abdominal pain.  Genitourinary: Positive for vaginal discharge. Negative for dysuria, flank pain, vaginal bleeding and pelvic pain.  Musculoskeletal: Negative for back pain, neck pain and neck stiffness.  Skin: Negative for rash.  Neurological: Negative for headaches.  All other systems reviewed and are negative.     Allergies  Apple; Banana; Orange fruit; Lactase-lactobacillus; Spinach; and Vicodin  Home Medications   Prior to Admission medications   Not on  File   BP 102/70  Pulse 93  Temp(Src) 98.2 F (36.8 C) (Oral)  Resp 18  Ht 5\' 6"  (1.676 m)  Wt 205 lb (92.987 kg)  BMI 33.10 kg/m2  SpO2 100% Physical Exam  Constitutional: She is oriented to person, place, and time. She appears well-developed and well-nourished.  HENT:  Head: Normocephalic and atraumatic.  Eyes: EOM are normal. Pupils are equal, round, and reactive to light.  Neck: Neck supple.  Cardiovascular: Regular rhythm and intact distal pulses.   Pulmonary/Chest: Effort normal. No respiratory distress.  Abdominal: Soft. There is no tenderness.  Genitourinary:  Pelvic exam: No external lesions,  moderate white discharge. No cervical motion tenderness. No adnexal masses or tenderness  Musculoskeletal: Normal range of motion. She exhibits no edema.  Neurological: She is alert and oriented to person, place, and time.  Skin: Skin is warm and dry.    ED Course  Procedures (including critical care time) Labs Review Labs Reviewed  WET PREP, GENITAL - Abnormal; Notable for the following:    Trich, Wet Prep MODERATE (*)    Clue Cells Wet Prep HPF POC FEW (*)    WBC, Wet Prep HPF POC FEW (*)    All other components within normal limits  URINALYSIS, ROUTINE W REFLEX MICROSCOPIC - Abnormal; Notable for the following:    APPearance CLOUDY (*)    Leukocytes, UA TRACE (*)    All other components within normal limits  GC/CHLAMYDIA PROBE AMP  URINE MICROSCOPIC-ADD ON  RPR  HIV ANTIBODY (ROUTINE TESTING)  POC URINE PREG, ED   Flagyl, azithromycin, Rocephin provided  Plan discharge home and [facility] Patient agrees to STD precautions and instructions. HIV and RPR pending.  MDM   Diagnosis: Trichomoniasis, history of STDs  Presents with vaginal discharge and request for STD testing. No fevers or pelvic/ abdominal pain. Evaluated with urinalysis and labs as above. Treated for trichomoniasis and possible GC Chlamydia with  cultures pending. Vital signs and nursing notes reviewed and considered.    Sunnie NielsenBrian Kyandre Okray, MD 03/28/14 [id number]

## 2014-03-28 NOTE — Discharge Instructions (Signed)
Trichomoniasis °Trichomoniasis is an infection, caused by the Trichomonas organism, that affects both women and men. In women, the outer female genitalia and the vagina are affected. In men, the penis is mainly affected, but the prostate and other reproductive organs can also be involved. Trichomoniasis is a sexually transmitted disease (STD) and is most often passed to another person through sexual contact. The majority of people who get trichomoniasis do so from a sexual encounter and are also at risk for other STDs. °CAUSES  °· Sexual intercourse with an infected partner. °· It can be present in swimming pools or hot tubs. °SYMPTOMS  °· Abnormal gray-green frothy vaginal discharge in women. °· Vaginal itching and irritation in women. °· Itching and irritation of the area outside the vagina in women. °· Penile discharge with or without pain in males. °· Inflammation of the urethra (urethritis), causing painful urination. °· Bleeding after sexual intercourse. °RELATED COMPLICATIONS °· Pelvic inflammatory disease. °· Infection of the uterus (endometritis). °· Infertility. °· Tubal (ectopic) pregnancy. °· It can be associated with other STDs, including gonorrhea and chlamydia, hepatitis B, and HIV. °COMPLICATIONS DURING PREGNANCY °· Early (premature) delivery. °· Premature rupture of the membranes (PROM). °· Low birth weight. °DIAGNOSIS  °· Visualization of Trichomonas under the microscope from the vagina discharge. °· Ph of the vagina greater than 4.5, tested with a test tape. °· Trich Rapid Test. °· Culture of the organism, but this is not usually needed. °· It may be found on a Pap test. °· Having a "strawberry cervix,"which means the cervix looks very red like a strawberry. °TREATMENT  °· You may be given medication to fight the infection. Inform your caregiver if you could be or are pregnant. Some medications used to treat the infection should not be taken during pregnancy. °· Over-the-counter medications or  creams to decrease itching or irritation may be recommended. °· Your sexual partner will need to be treated if infected. °HOME CARE INSTRUCTIONS  °· Take all medication prescribed by your caregiver. °· Take over-the-counter medication for itching or irritation as directed by your caregiver. °· Do not have sexual intercourse while you have the infection. °· Do not douche or wear tampons. °· Discuss your infection with your partner, as your partner may have acquired the infection from you. Or, your partner may have been the person who transmitted the infection to you. °· Have your sex partner examined and treated if necessary. °· Practice safe, informed, and protected sex. °· See your caregiver for other STD testing. °SEEK MEDICAL CARE IF:  °· You still have symptoms after you finish the medication. °· You have an oral temperature above 102° F (38.9° C). °· You develop belly (abdominal) pain. °· You have pain when you urinate. °· You have bleeding after sexual intercourse. °· You develop a rash. °· The medication makes you sick or makes you throw up (vomit). °Document Released: 05/07/2001 Document Revised: 02/03/2012 Document Reviewed: 06/02/2009 °[employer]® Patient Information ©2014 [employer]. ° °

## 2014-05-07 ENCOUNTER — Emergency Department (HOSPITAL_COMMUNITY): Payer: Self-pay

## 2014-05-07 ENCOUNTER — Emergency Department (HOSPITAL_COMMUNITY)
Admission: EM | Admit: 2014-05-07 | Discharge: 2014-05-07 | Disposition: A | Discharge status: Home / Self Care | Payer: Self-pay | Attending: Emergency Medicine | Admitting: Emergency Medicine

## 2014-05-07 ENCOUNTER — Encounter (HOSPITAL_COMMUNITY): Payer: Self-pay | Admitting: Emergency Medicine

## 2014-05-07 DIAGNOSIS — F17200 Nicotine dependence, unspecified, uncomplicated: Secondary | ICD-10-CM | POA: Insufficient documentation

## 2014-05-07 DIAGNOSIS — B9789 Other viral agents as the cause of diseases classified elsewhere: Secondary | ICD-10-CM

## 2014-05-07 DIAGNOSIS — J029 Acute pharyngitis, unspecified: Secondary | ICD-10-CM | POA: Insufficient documentation

## 2014-05-07 DIAGNOSIS — J988 Other specified respiratory disorders: Secondary | ICD-10-CM

## 2014-05-07 DIAGNOSIS — Z791 Long term (current) use of non-steroidal anti-inflammatories (NSAID): Secondary | ICD-10-CM | POA: Insufficient documentation

## 2014-05-07 DIAGNOSIS — H9209 Otalgia, unspecified ear: Secondary | ICD-10-CM | POA: Insufficient documentation

## 2014-05-07 DIAGNOSIS — J069 Acute upper respiratory infection, unspecified: Secondary | ICD-10-CM | POA: Insufficient documentation

## 2014-05-07 LAB — RAPID STREP SCREEN (MED CTR MEBANE ONLY): STREPTOCOCCUS, GROUP A SCREEN (DIRECT): NEGATIVE

## 2014-05-07 MED ORDER — ACETAMINOPHEN 325 MG PO TABS
650.0000 mg | ORAL_TABLET | Freq: Once | ORAL | Status: AC
  Administered 2014-05-07: 650 mg via ORAL

## 2014-05-07 MED ORDER — ACETAMINOPHEN 500 MG PO TABS: 500.0000 mg | ORAL_TABLET | Freq: Four times a day (QID) | ORAL | Status: DC | PRN

## 2014-05-07 MED ORDER — IBUPROFEN 800 MG PO TABS: 800.0000 mg | ORAL_TABLET | Freq: Three times a day (TID) | ORAL | Status: DC

## 2014-05-07 MED ORDER — LIDOCAINE VISCOUS 2 % MT SOLN: 20.0000 mL | OROMUCOSAL | Status: DC | PRN

## 2014-05-07 MED ORDER — GUAIFENESIN-CODEINE 100-10 MG/5ML PO SOLN
10.0000 mL | Freq: Four times a day (QID) | ORAL | Status: DC | PRN
Start: 2014-05-07 — End: 2014-11-26

## 2014-05-07 MED ORDER — LIDOCAINE VISCOUS 2 % MT SOLN
15.0000 mL | Freq: Once | OROMUCOSAL | Status: AC
  Administered 2014-05-07: 15 mL via OROMUCOSAL
  Filled 2014-05-07: qty 15

## 2014-05-07 NOTE — ED Notes (Signed)
Patient returned from X-ray 

## 2014-05-07 NOTE — ED Notes (Signed)
The pt has taken tylenol and has not had any reaction from plain tylenol.  Only when she takes vicodin

## 2014-05-07 NOTE — ED Notes (Signed)
Patient transported to X-ray 

## 2014-05-07 NOTE — ED Notes (Signed)
Patient states she does not need to give urine; she knows without a doubt she is not pregnant. Explained the reason for the precaution; she denies any possibility of pregnancy and does not want to give urine.

## 2014-05-07 NOTE — ED Notes (Signed)
Both ears hurting also

## 2014-05-07 NOTE — ED Notes (Signed)
The pt has had a cold sore throat aching all over her body elevated  Temp that started this am she has sinus drainage also.  lmp none .  Headache backache

## 2014-05-07 NOTE — ED Provider Notes (Signed)
CSN: [id number]     Arrival date & time 05/07/14  1736 History   First MD Initiated Contact with Patient 05/07/14 1819     Chief Complaint  Patient presents with  . multiple complaints      (Consider location/radiation/quality/duration/timing/severity/associated sxs/prior Treatment) HPI Comments: Patient is a 23 year old female past medical history significant for tobacco use presented to the emergency department for acute onset sore throat with associated fevers, chills, nasal congestion, rhinorrhea, nonproductive cough, generalized headache, myalgias and arthralgias. Alleviating factors: none. Aggravating factors: eating, drinking. Medications tried prior to arrival: none. Denies any sick contacts. LMP 2012.      History reviewed. No pertinent past medical history. History reviewed. No pertinent past surgical history. Family History  Problem Relation Age of Onset  . Hypertension Mother   . Heart disease Mother   . Stroke Mother   . Diabetes Maternal Grandmother    History  Substance Use Topics  . Smoking status: Current Every Day Smoker -- 0.25 packs/day    Types: Cigarettes  . Smokeless tobacco: Never Used  . Alcohol Use: Yes     Comment: occasion   OB History   Grav Para Term Preterm Abortions TAB SAB Ect Mult Living                 Review of Systems  Constitutional: Positive for fever and chills.  HENT: Positive for congestion, ear pain, rhinorrhea, sinus pressure and sore throat. Negative for tinnitus and trouble swallowing.   Respiratory: Positive for cough. Negative for shortness of breath.   Cardiovascular: Negative for chest pain.  Gastrointestinal: Negative for nausea, vomiting and abdominal pain.  Musculoskeletal: Positive for arthralgias and myalgias.  Neurological: Positive for headaches. Negative for syncope and light-headedness.  All other systems reviewed and are negative.     Allergies  Apple; Banana; Orange fruit; Lactase-lactobacillus; Spinach;  and Vicodin  Home Medications   Prior to Admission medications   Medication Sig Start Date End Date Taking? Authorizing Provider  acetaminophen (TYLENOL) 500 MG tablet Take 1 tablet (500 mg total) by mouth every 6 (six) hours as needed. 05/07/14   Faraz Ponciano L Delonda Coley, PA-C  guaiFENesin-codeine 100-10 MG/5ML syrup Take 10 mLs by mouth every 6 (six) hours as needed for cough. 05/07/14   Andraya Frigon L Orey Moure, PA-C  ibuprofen (ADVIL,MOTRIN) 800 MG tablet Take 1 tablet (800 mg total) by mouth 3 (three) times daily. 05/07/14   Mckinzie Saksa L Tesean Stump, PA-C  lidocaine (XYLOCAINE) 2 % solution Use as directed 20 mLs in the mouth or throat as needed for mouth pain. 05/07/14   Jamesa Tedrick L Dynastie Knoop, PA-C   BP 96/55  Pulse 85  Temp(Src) 99.7 F (37.6 C) (Oral)  Resp 16  SpO2 99% Physical Exam  Nursing note and vitals reviewed. Constitutional: She is oriented to person, place, and time. She appears well-developed and well-nourished. No distress.  HENT:  Head: Normocephalic and atraumatic.  Right Ear: Hearing, tympanic membrane, external ear and ear canal normal. No mastoid tenderness.  Left Ear: Hearing, tympanic membrane, external ear and ear canal normal. No mastoid tenderness.  Nose: Rhinorrhea present.  Mouth/Throat: Uvula is midline and mucous membranes are normal. Posterior oropharyngeal erythema present. No oropharyngeal exudate, posterior oropharyngeal edema or tonsillar abscesses.  Eyes: Conjunctivae are normal.  Neck: Normal range of motion. Neck supple.  Cardiovascular: Normal rate, regular rhythm, normal heart sounds and intact distal pulses.   Pulmonary/Chest: Effort normal and breath sounds normal. No respiratory distress.  Abdominal: Soft. There is no tenderness.  Musculoskeletal: Normal range of motion. She exhibits no edema.  Moves all extremities w/o ataxia.   Lymphadenopathy:    She has cervical adenopathy.  Neurological: She is alert and oriented to person, place, and  time.  Skin: Skin is warm and dry. She is not diaphoretic.  Psychiatric: She has a normal mood and affect.    ED Course  Procedures (including critical care time) Medications  acetaminophen (TYLENOL) tablet 650 mg (650 mg Oral Given 05/07/14 1757)  lidocaine (XYLOCAINE) 2 % viscous mouth solution 15 mL (15 mLs Mouth/Throat Given 05/07/14 1836)    Labs Review Labs Reviewed  RAPID STREP SCREEN  CULTURE, GROUP A STREP    Imaging Review Dg Chest 2 View  05/07/2014   CLINICAL DATA:  Fever and cough.  EXAM: CHEST  2 VIEW  COMPARISON:  Chest x-ray 08/13/2013 .  FINDINGS: Mediastinum and hilar structures normal. Lungs are clear. Heart size normal. No acute bony abnormality.  IMPRESSION: No acute cardiopulmonary disease.   Electronically Signed   By: Maisie Fushomas  Register   On: 05/07/2014 19:53     EKG Interpretation None      MDM   Final diagnoses:  Viral respiratory illness    Filed Vitals:   05/07/14 2024  BP: 96/55  Pulse: 85  Temp:   Resp: 16     Patient refuses to provide UA. Low suspicion for UTI or urinary source of infection.   Patient presenting with fever to ED. Pt alert, active, and oriented per age. PE showed lungs clear. Abdomen soft, non-tender, non-distended. Oropharynx erythematous w/ mild swelling. No exudate. Cervical adenopathy. No meningeal signs. Pt tolerating PO liquids in ED without difficulty. Tylenol given and improvement of fever. CXR and Rapid strep test negative. Likely viral illness. Advised PCP follow up in 1-2 days. Return precautions discussed. Patient agreeable to plan. Stable at time of discharge.    Jeannetta EllisJennifer L Bosco Paparella, PA-C 05/08/14 1437

## 2014-05-07 NOTE — Discharge Instructions (Signed)
Please follow up with your primary care physician in 1-2 days. If you do not have one please call the [facility] number listed above. Please take pain medication and/or muscle relaxants as prescribed and as needed for pain. Please do not drive on narcotic pain medication or on muscle relaxants. Please alternate between Motrin and Tylenol every three hours for fevers and pain. Please read all discharge instructions and return precautions.   Upper Respiratory Infection, Adult An upper respiratory infection (URI) is also known as the common cold. It is often caused by a type of germ (virus). Colds are easily spread (contagious). You can pass it to others by kissing, coughing, sneezing, or drinking out of the same glass. Usually, you get better in 1 or 2 weeks.  HOME CARE   Only take medicine as told by your doctor.  Use a warm mist humidifier or breathe in steam from a hot shower.  Drink enough water and fluids to keep your pee (urine) clear or pale yellow.  Get plenty of rest.  Return to work when your temperature is back to normal or as told by your doctor. You may use a face mask and wash your hands to stop your cold from spreading. GET HELP RIGHT AWAY IF:   After the first few days, you feel you are getting worse.  You have questions about your medicine.  You have chills, shortness of breath, or brown or red spit (mucus).  You have yellow or brown snot (nasal discharge) or pain in the face, especially when you bend forward.  You have a fever, puffy (swollen) neck, pain when you swallow, or white spots in the back of your throat.  You have a bad headache, ear pain, sinus pain, or chest pain.  You have a high-pitched whistling sound when you breathe in and out (wheezing).  You have a lasting cough or cough up blood.  You have sore muscles or a stiff neck. MAKE SURE YOU:   Understand these instructions.  Will watch your condition.  Will get help right away  if you are not doing well or get worse. Document Released: 04/29/2008 Document Revised: 02/03/2012 Document Reviewed: 03/18/2011 [facility] Patient Information 2014 [place].

## 2014-05-09 LAB — CULTURE, GROUP A STREP

## 2014-05-09 NOTE — ED Provider Notes (Signed)
Medical screening examination/treatment/procedure(s) were performed by non-physician practitioner and as supervising physician I was immediately available for consultation/collaboration.   EKG Interpretation None        Korene Dula M Emerlyn Mehlhoff, MD 05/09/14 0028 

## 2014-08-05 IMAGING — CR DG CHEST 2V
2 series · 2 of 2 positions shown · non-contrast
Comparison: Chest x-ray 08/13/2013 .

CLINICAL DATA: Fever and cough.

EXAM:
CHEST  2 VIEW

[w chest pa *]
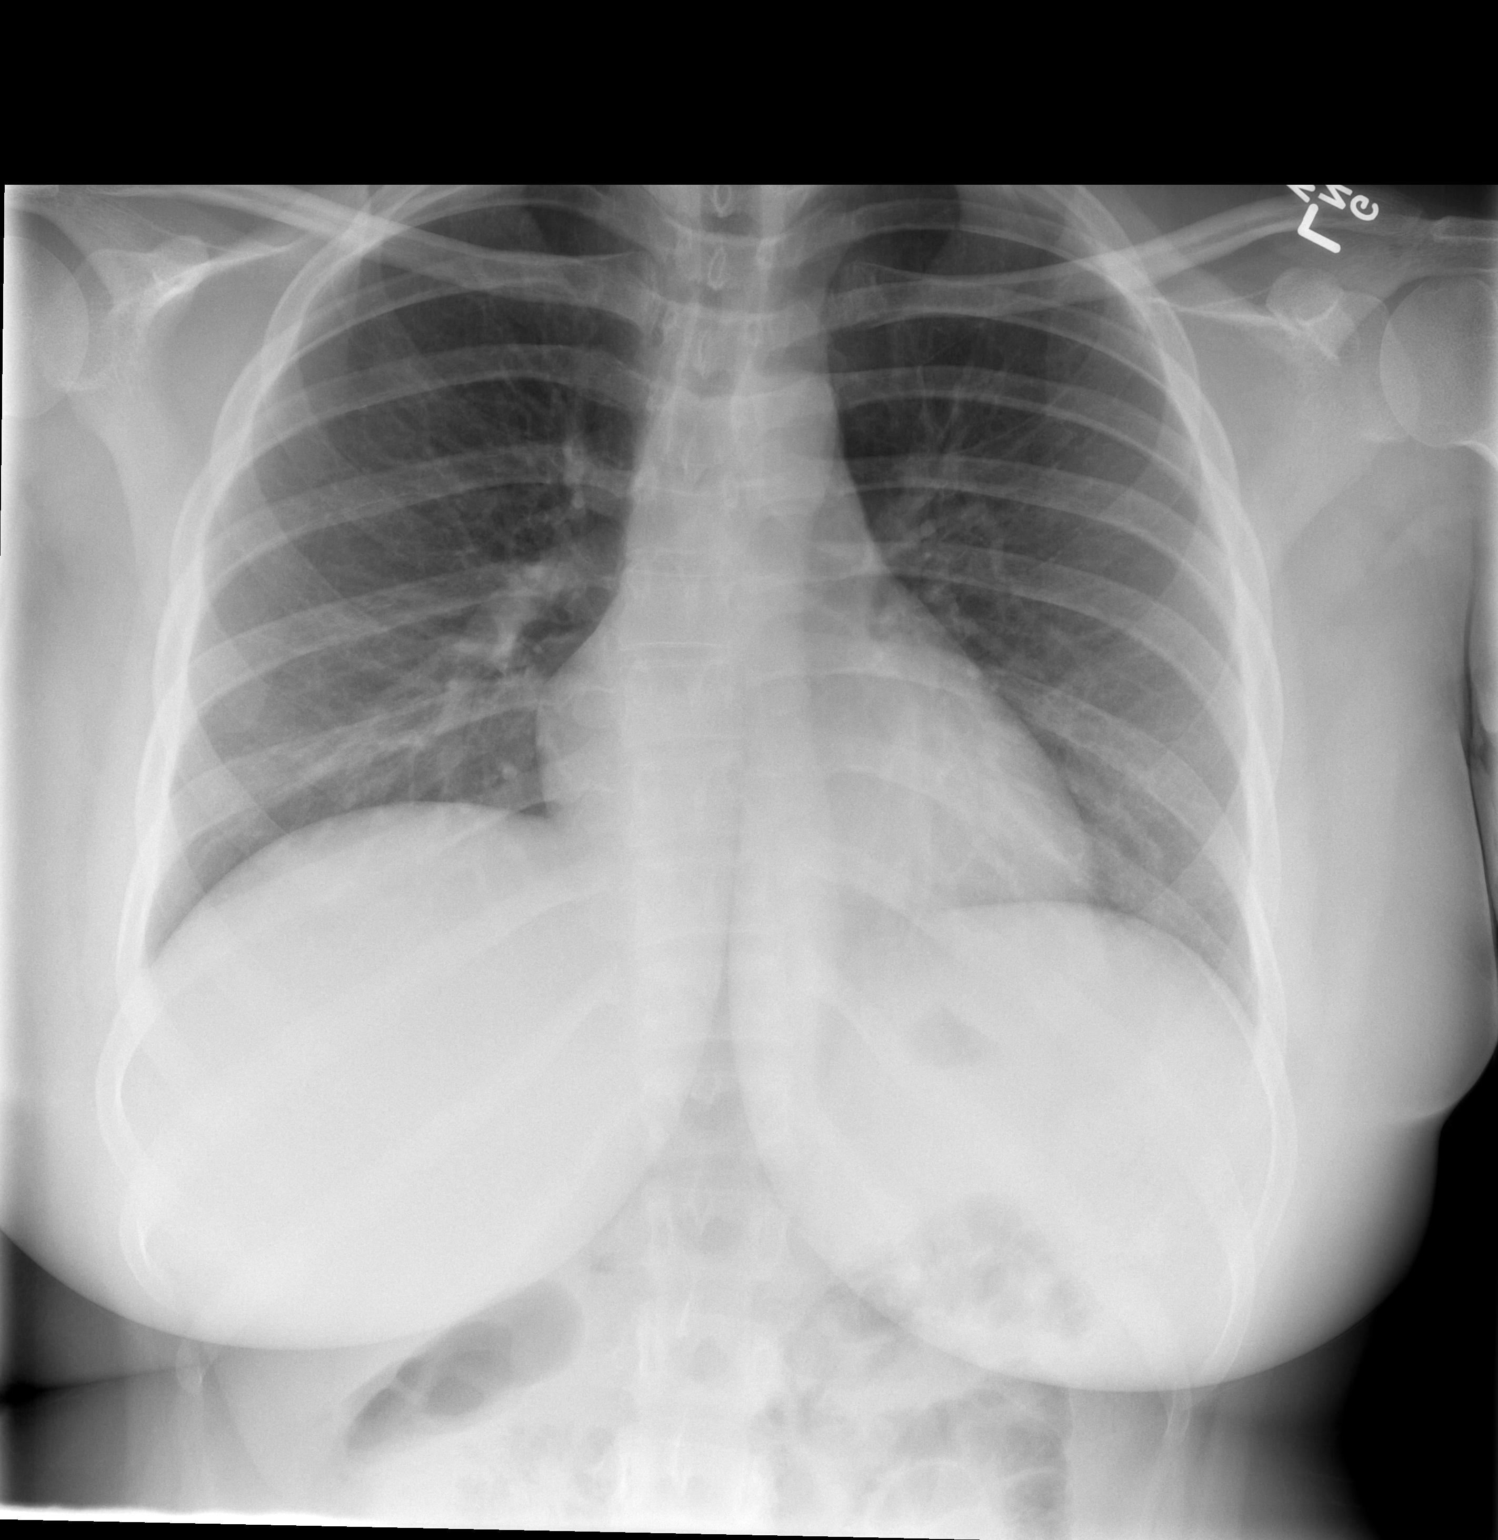

[w chest lat *]
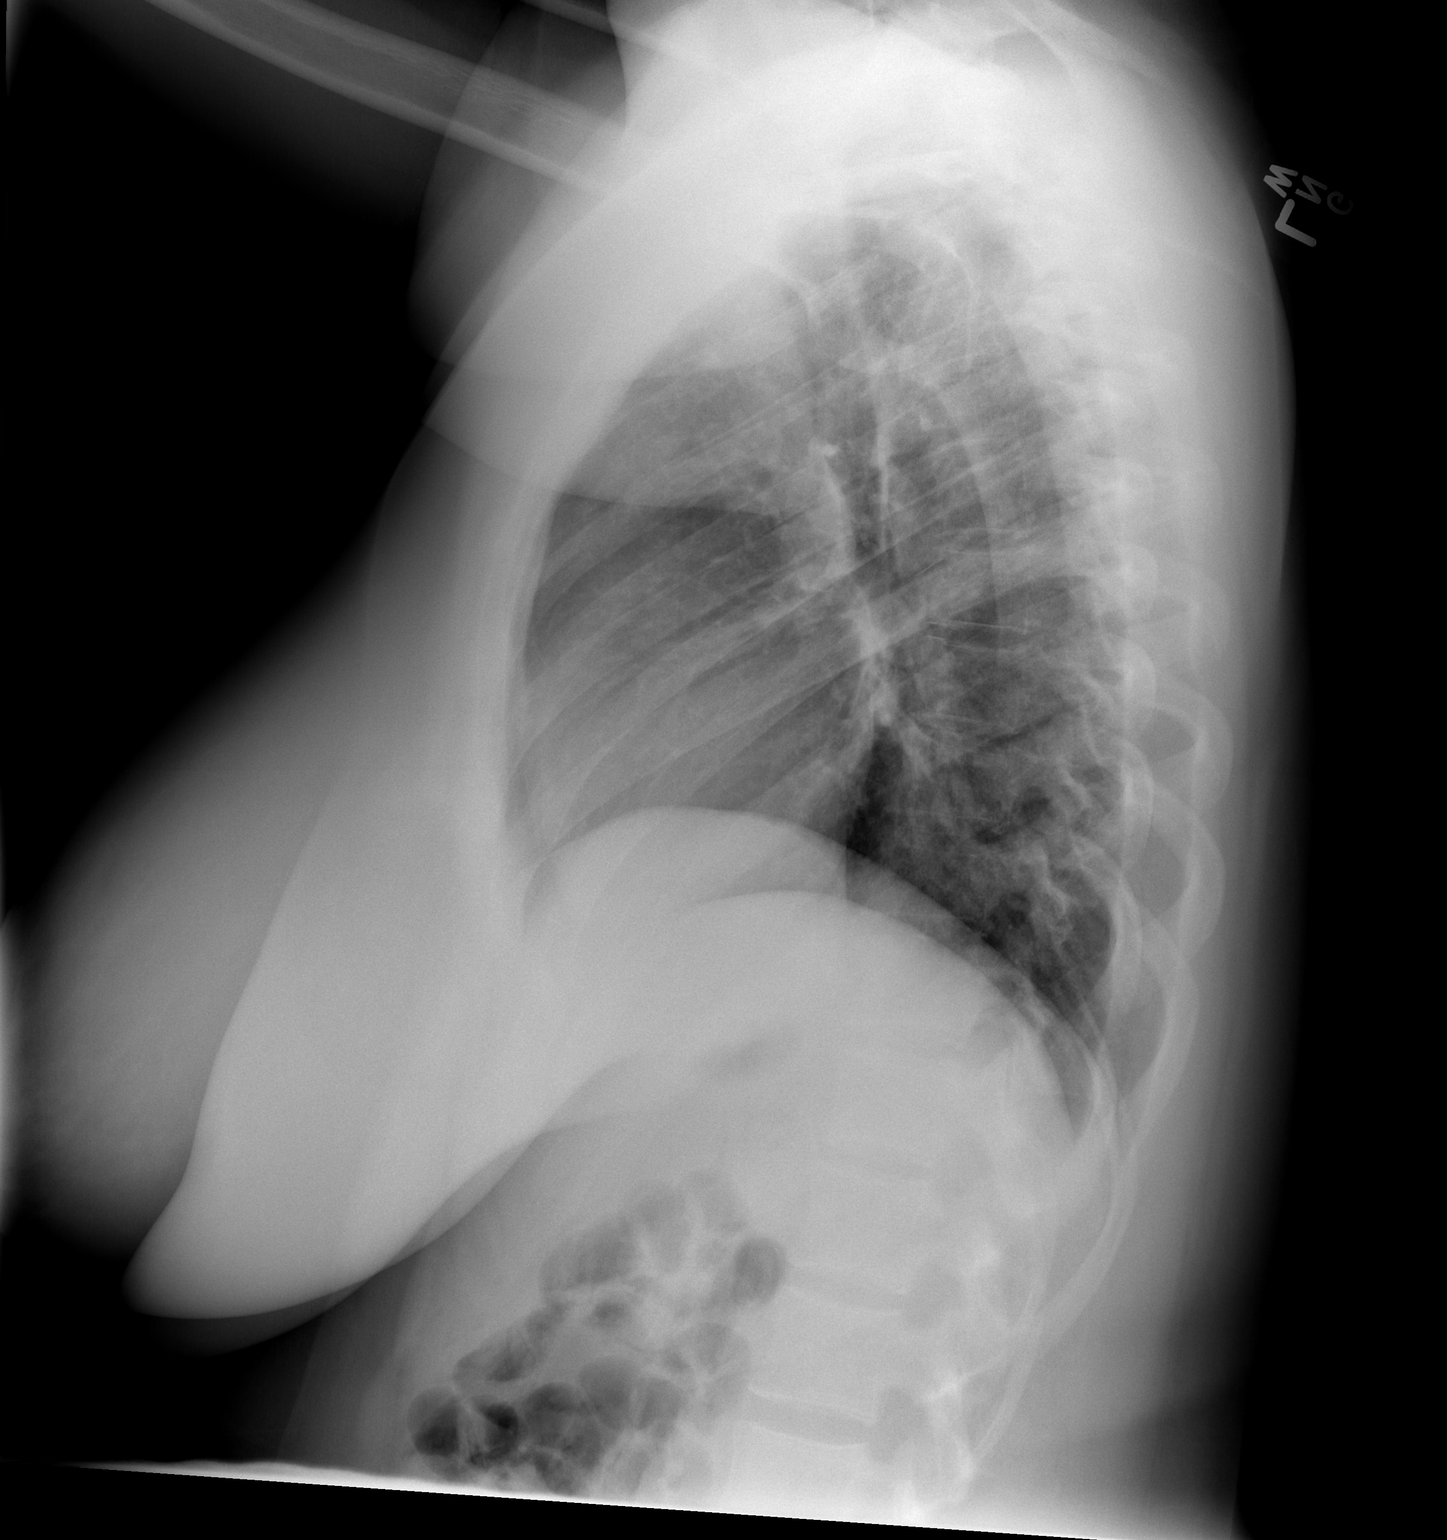

[2 of 2 positions shown; findings below may reference images not displayed]

FINDINGS: Mediastinum and hilar structures normal. Lungs are clear. Heart size
normal. No acute bony abnormality.
IMPRESSION: No acute cardiopulmonary disease.

## 2014-11-26 ENCOUNTER — Emergency Department (HOSPITAL_COMMUNITY)
Admission: EM | Admit: 2014-11-26 | Discharge: 2014-11-26 | Disposition: A | Discharge status: Home / Self Care | Payer: Self-pay | Attending: Emergency Medicine | Admitting: Emergency Medicine

## 2014-11-26 ENCOUNTER — Encounter (HOSPITAL_COMMUNITY): Payer: Self-pay | Admitting: Emergency Medicine

## 2014-11-26 DIAGNOSIS — Z720 Tobacco use: Secondary | ICD-10-CM | POA: Insufficient documentation

## 2014-11-26 DIAGNOSIS — M791 Myalgia, unspecified site: Secondary | ICD-10-CM

## 2014-11-26 DIAGNOSIS — R52 Pain, unspecified: Secondary | ICD-10-CM | POA: Insufficient documentation

## 2014-11-26 DIAGNOSIS — Z3202 Encounter for pregnancy test, result negative: Secondary | ICD-10-CM | POA: Insufficient documentation

## 2014-11-26 DIAGNOSIS — N760 Acute vaginitis: Secondary | ICD-10-CM | POA: Insufficient documentation

## 2014-11-26 DIAGNOSIS — B349 Viral infection, unspecified: Secondary | ICD-10-CM | POA: Insufficient documentation

## 2014-11-26 DIAGNOSIS — Z791 Long term (current) use of non-steroidal anti-inflammatories (NSAID): Secondary | ICD-10-CM | POA: Insufficient documentation

## 2014-11-26 DIAGNOSIS — B9689 Other specified bacterial agents as the cause of diseases classified elsewhere: Secondary | ICD-10-CM

## 2014-11-26 LAB — URINALYSIS, ROUTINE W REFLEX MICROSCOPIC
BILIRUBIN URINE: NEGATIVE
GLUCOSE, UA: NEGATIVE mg/dL
Hgb urine dipstick: NEGATIVE
Ketones, ur: NEGATIVE mg/dL
Leukocytes, UA: NEGATIVE
Nitrite: NEGATIVE
PH: 8 (ref 5.0–8.0)
Protein, ur: NEGATIVE mg/dL
Specific Gravity, Urine: 1.01 (ref 1.005–1.030)
Urobilinogen, UA: 0.2 mg/dL (ref 0.0–1.0)

## 2014-11-26 LAB — CBC WITH DIFFERENTIAL/PLATELET
BASOS PCT: 0 % (ref 0–1)
Basophils Absolute: 0 10*3/uL (ref 0.0–0.1)
EOS PCT: 5 % (ref 0–5)
Eosinophils Absolute: 0.4 10*3/uL (ref 0.0–0.7)
HCT: 38.4 % (ref 36.0–46.0)
Hemoglobin: 12.4 g/dL (ref 12.0–15.0)
Lymphocytes Relative: 28 % (ref 12–46)
Lymphs Abs: 1.9 10*3/uL (ref 0.7–4.0)
MCH: 29.6 pg (ref 26.0–34.0)
MCHC: 32.3 g/dL (ref 30.0–36.0)
MCV: 91.6 fL (ref 78.0–100.0)
Monocytes Absolute: 0.5 10*3/uL (ref 0.1–1.0)
Monocytes Relative: 8 % (ref 3–12)
NEUTROS PCT: 59 % (ref 43–77)
Neutro Abs: 4 10*3/uL (ref 1.7–7.7)
PLATELETS: 305 10*3/uL (ref 150–400)
RBC: 4.19 MIL/uL (ref 3.87–5.11)
RDW: 13.1 % (ref 11.5–15.5)
WBC: 6.9 10*3/uL (ref 4.0–10.5)

## 2014-11-26 LAB — BASIC METABOLIC PANEL
ANION GAP: 5 (ref 5–15)
BUN: 5 mg/dL — ABNORMAL LOW (ref 6–23)
CHLORIDE: 103 meq/L (ref 96–112)
CO2: 31 mmol/L (ref 19–32)
Calcium: 8.9 mg/dL (ref 8.4–10.5)
Creatinine, Ser: 0.84 mg/dL (ref 0.50–1.10)
GFR calc non Af Amer: >90 mL/min (ref >90)
Glucose, Bld: 108 mg/dL — ABNORMAL HIGH (ref 70–99)
POTASSIUM: 3.7 mmol/L (ref 3.5–5.1)
SODIUM: 139 mmol/L (ref 135–145)

## 2014-11-26 LAB — WET PREP, GENITAL
TRICH WET PREP: NONE SEEN
WBC, Wet Prep HPF POC: NONE SEEN
Yeast Wet Prep HPF POC: NONE SEEN

## 2014-11-26 LAB — PREGNANCY, URINE: PREG TEST UR: NEGATIVE

## 2014-11-26 MED ORDER — NAPROXEN 500 MG PO TABS: 500.0000 mg | ORAL_TABLET | Freq: Two times a day (BID) | ORAL | Status: DC

## 2014-11-26 MED ORDER — METRONIDAZOLE 500 MG PO TABS
500.0000 mg | ORAL_TABLET | Freq: Once | ORAL | Status: DC
  Filled 2014-11-26: qty 1

## 2014-11-26 MED ORDER — METRONIDAZOLE 500 MG PO TABS
500.0000 mg | ORAL_TABLET | Freq: Two times a day (BID) | ORAL | Status: DC
Start: 2014-11-26 — End: 2014-11-26

## 2014-11-26 MED ORDER — NAPROXEN 250 MG PO TABS
500.0000 mg | ORAL_TABLET | Freq: Once | ORAL | Status: AC
  Administered 2014-11-26: 500 mg via ORAL
  Filled 2014-11-26: qty 2

## 2014-11-26 MED ORDER — METRONIDAZOLE 0.75 % VA GEL: 1.0000 | Freq: Two times a day (BID) | VAGINAL | Status: DC

## 2014-11-26 NOTE — ED Provider Notes (Signed)
CSN: [id number]     Arrival date & time 11/26/14  0029 History  This chart was scribed for Olivia Mackie, MD by Gwenyth Ober, ED Scribe. This patient was seen in room A05C/A05C and the patient's care was started at 1:35 AM.    Chief Complaint  Patient presents with  . Vaginal Discharge  . Generalized Body Aches   The history is provided by the patient. No language interpreter was used.    HPI Comments: Stephanie Reid is a 24 y.o. female who presents to the Emergency Department complaining of constant, generalized body aches that started 3 days ago. Pt notes white vaginal discharge, lower abdominal pain and diarrhea as associated symptoms. She states pain becomes worse with movement. Pt has a history of STIs, but denies new sexual partners. She currently has unprotected intercourse. Pt has not had a menstrual cycle in 4 years, for an unknown cause, and has seen a gynecologist. She was referred to an infertility clinic, but did not go for financial reasons. Pt denies dysuria, fever, chills, nausea and vomiting as associated symptoms.  History reviewed. No pertinent past medical history. History reviewed. No pertinent past surgical history. Family History  Problem Relation Age of Onset  . Hypertension Mother   . Heart disease Mother   . Stroke Mother   . Diabetes Maternal Grandmother    History  Substance Use Topics  . Smoking status: Current Every Day Smoker -- 0.25 packs/day    Types: Cigarettes  . Smokeless tobacco: Never Used  . Alcohol Use: Yes     Comment: occasion   OB History    No data available     Review of Systems  Constitutional: Negative for fever and chills.  Gastrointestinal: Positive for abdominal pain and diarrhea. Negative for vomiting.  Genitourinary: Positive for vaginal discharge. Negative for dysuria.  All other systems reviewed and are negative.     Allergies  Apple; Banana; Orange fruit; Lactase-lactobacillus; Spinach; and Vicodin  Home  Medications   Prior to Admission medications   Medication Sig Start Date End Date Taking? Authorizing Provider  acetaminophen (TYLENOL) 500 MG tablet Take 1 tablet (500 mg total) by mouth every 6 (six) hours as needed. 05/07/14   Jennifer L Piepenbrink, PA-C  guaiFENesin-codeine 100-10 MG/5ML syrup Take 10 mLs by mouth every 6 (six) hours as needed for cough. 05/07/14   Jennifer L Piepenbrink, PA-C  ibuprofen (ADVIL,MOTRIN) 800 MG tablet Take 1 tablet (800 mg total) by mouth 3 (three) times daily. 05/07/14   Jennifer L Piepenbrink, PA-C  lidocaine (XYLOCAINE) 2 % solution Use as directed 20 mLs in the mouth or throat as needed for mouth pain. 05/07/14   Jennifer L Piepenbrink, PA-C   BP 102/59 mmHg  Pulse 86  Temp(Src) 98.1 F (36.7 C) (Oral)  Resp 16  SpO2 97% Physical Exam  Constitutional: She is oriented to person, place, and time. She appears well-developed and well-nourished. No distress.  HENT:  Head: Normocephalic and atraumatic.  Right Ear: External ear normal.  Left Ear: External ear normal.  Nose: Nose normal.  Mouth/Throat: Oropharynx is clear and moist.  Eyes: Conjunctivae and EOM are normal. Pupils are equal, round, and reactive to light.  Neck: Normal range of motion. Neck supple. No JVD present. No tracheal deviation present. No thyromegaly present.  Cardiovascular: Normal rate, regular rhythm, normal heart sounds and intact distal pulses.  Exam reveals no gallop and no friction rub.   No murmur heard. Pulmonary/Chest: Effort normal and breath sounds  normal. No stridor. No respiratory distress. She has no wheezes. She has no rales. She exhibits no tenderness.  Abdominal: Soft. Bowel sounds are normal. She exhibits no distension and no mass. There is no tenderness. There is no rebound and no guarding.  Genitourinary:  External genitalia within normal limits Vagina with discharge Cervix  normal negative for cervical motion tenderness Adnexa palpated, no masses or negative  for tenderness noted Bladder palpated negative for tenderness Uterus palpated no masses or negative for tenderness    Musculoskeletal: Normal range of motion. She exhibits no edema or tenderness.  Lymphadenopathy:    She has no cervical adenopathy.  Neurological: She is alert and oriented to person, place, and time. She displays normal reflexes. She exhibits normal muscle tone. Coordination normal.  Skin: Skin is warm and dry. No rash noted. No erythema. No pallor.  Psychiatric: She has a normal mood and affect. Her behavior is normal. Judgment and thought content normal.  Nursing note and vitals reviewed.   ED Course  Procedures (including critical care time) DIAGNOSTIC STUDIES: Oxygen Saturation is 97% on RA, normal by my interpretation.    COORDINATION OF CARE: 1:42 AM Discussed treatment plan with pt at bedside and pt agreed to plan.    Labs Review Labs Reviewed - No data to display  Imaging Review No results found.   EKG Interpretation None     Results for orders placed or performed during the hospital encounter of 11/26/14  Wet prep, genital  Result Value Ref Range   Yeast Wet Prep HPF POC NONE SEEN NONE SEEN   Trich, Wet Prep NONE SEEN NONE SEEN   Clue Cells Wet Prep HPF POC FEW (A) NONE SEEN   WBC, Wet Prep HPF POC NONE SEEN NONE SEEN  Pregnancy, urine  Result Value Ref Range   Preg Test, Ur NEGATIVE NEGATIVE  Urinalysis, Routine w reflex microscopic  Result Value Ref Range   Color, Urine YELLOW YELLOW   APPearance CLEAR CLEAR   Specific Gravity, Urine 1.010 1.005 - 1.030   pH 8.0 5.0 - 8.0   Glucose, UA NEGATIVE NEGATIVE mg/dL   Hgb urine dipstick NEGATIVE NEGATIVE   Bilirubin Urine NEGATIVE NEGATIVE   Ketones, ur NEGATIVE NEGATIVE mg/dL   Protein, ur NEGATIVE NEGATIVE mg/dL   Urobilinogen, UA 0.2 0.0 - 1.0 mg/dL   Nitrite NEGATIVE NEGATIVE   Leukocytes, UA NEGATIVE NEGATIVE  CBC with Differential  Result Value Ref Range   WBC 6.9 4.0 - 10.5 K/uL    RBC 4.19 3.87 - 5.11 MIL/uL   Hemoglobin 12.4 12.0 - 15.0 g/dL   HCT 16.1 09.6 - 04.5 %   MCV 91.6 78.0 - 100.0 fL   MCH 29.6 26.0 - 34.0 pg   MCHC 32.3 30.0 - 36.0 g/dL   RDW 40.9 81.1 - 91.4 %   Platelets 305 150 - 400 K/uL   Neutrophils Relative % 59 43 - 77 %   Neutro Abs 4.0 1.7 - 7.7 K/uL   Lymphocytes Relative 28 12 - 46 %   Lymphs Abs 1.9 0.7 - 4.0 K/uL   Monocytes Relative 8 3 - 12 %   Monocytes Absolute 0.5 0.1 - 1.0 K/uL   Eosinophils Relative 5 0 - 5 %   Eosinophils Absolute 0.4 0.0 - 0.7 K/uL   Basophils Relative 0 0 - 1 %   Basophils Absolute 0.0 0.0 - 0.1 K/uL  Basic metabolic panel  Result Value Ref Range   Sodium 139 135 - 145 mmol/L  Potassium 3.7 3.5 - 5.1 mmol/L   Chloride 103 96 - 112 mEq/L   CO2 31 19 - 32 mmol/L   Glucose, Bld 108 (H) 70 - 99 mg/dL   BUN 5 (L) 6 - 23 mg/dL   Creatinine, Ser 0.98 0.50 - 1.10 mg/dL   Calcium 8.9 8.4 - 11.9 mg/dL   GFR calc non Af Amer >90 >90 mL/min   GFR calc Af Amer >90 >90 mL/min   Anion gap 5 5 - 15   No results found.   MDM   Final diagnoses:  Myalgia  Bacterial vaginosis  Viral syndrome    24 year old female with myalgias, diarrhea and vaginal discharge for 3 days.  No new sexual contacts.  No fevers.  No vomiting.  Pelvic exam with thin milky fluid, no signs of PID.  Suspect bacterial vaginosis.  Labs unremarkable.  Patient is stable for discharge home.  She is instructed to use Tylenol or ibuprofen for body aches.  She does not like taking oral Flagyl as it makes her nauseated.  Will do MetroGel suppositories  I personally performed the services described in this documentation, which was scribed in my presence. The recorded information has been reviewed and is accurate.     Olivia Mackie, MD 11/26/14 [phone]

## 2014-11-26 NOTE — Discharge Instructions (Signed)
Please take medications as prescribed.  Take naprosyn for body aches. Take flagyl twice a day for a week to treat bacterial vaginosis.  Drink plenty of fluids. Rest.   Bacterial Vaginosis Bacterial vaginosis is a vaginal infection that occurs when the normal balance of bacteria in the vagina is disrupted. It results from an overgrowth of certain bacteria. This is the most common vaginal infection in women of childbearing age. Treatment is important to prevent complications, especially in pregnant women, as it can cause a premature delivery. CAUSES  Bacterial vaginosis is caused by an increase in harmful bacteria that are normally present in smaller amounts in the vagina. Several different kinds of bacteria can cause bacterial vaginosis. However, the reason that the condition develops is not fully understood. RISK FACTORS Certain activities or behaviors can put you at an increased risk of developing bacterial vaginosis, including:  Having a new sex partner or multiple sex partners.  Douching.  Using an intrauterine device (IUD) for contraception. Women do not get bacterial vaginosis from toilet seats, bedding, swimming pools, or contact with objects around them. SIGNS AND SYMPTOMS  Some women with bacterial vaginosis have no signs or symptoms. Common symptoms include:  Grey vaginal discharge.  A fishlike odor with discharge, especially after sexual intercourse.  Itching or burning of the vagina and vulva.  Burning or pain with urination. DIAGNOSIS  Your health care provider will take a medical history and examine the vagina for signs of bacterial vaginosis. A sample of vaginal fluid may be taken. Your health care provider will look at this sample under a microscope to check for bacteria and abnormal cells. A vaginal pH test may also be done.  TREATMENT  Bacterial vaginosis may be treated with antibiotic medicines. These may be given in the form of a pill or a vaginal cream. A second round  of antibiotics may be prescribed if the condition comes back after treatment.  HOME CARE INSTRUCTIONS   Only take over-the-counter or prescription medicines as directed by your health care provider.  If antibiotic medicine was prescribed, take it as directed. Make sure you finish it even if you start to feel better.  Do not have sex until treatment is completed.  Tell all sexual partners that you have a vaginal infection. They should see their health care provider and be treated if they have problems, such as a mild rash or itching.  Practice safe sex by using condoms and only having one sex partner. SEEK MEDICAL CARE IF:   Your symptoms are not improving after 3 days of treatment.  You have increased discharge or pain.  You have a fever. MAKE SURE YOU:   Understand these instructions.  Will watch your condition.  Will get help right away if you are not doing well or get worse. FOR MORE INFORMATION  Centers for Disease Control and Prevention, Division of STD Prevention: [employer]Apps.co.za American Sexual Health Association (ASHA): www.ashastd.org  Document Released: 11/11/2005 Document Revised: 09/01/2013 Document Reviewed: 06/23/2013 [facility] Patient Information 2015 [place], [place]. This information is not intended to replace advice given to you by your health care provider. Make sure you discuss any questions you have with your health care provider.  Musculoskeletal Pain Musculoskeletal pain is muscle and boney aches and pains. These pains can occur in any part of the body. Your caregiver may treat you without knowing the cause of the pain. They may treat you if blood or urine tests, X-rays, and other tests were normal.  CAUSES There is often  not a definite cause or reason for these pains. These pains may be caused by a type of germ (virus). The discomfort may also come from overuse. Overuse includes working out too hard when your body is not fit. Boney aches also come from weather  changes. Bone is sensitive to atmospheric pressure changes. HOME CARE INSTRUCTIONS   Ask when your test results will be ready. Make sure you get your test results.  Only take over-the-counter or prescription medicines for pain, discomfort, or fever as directed by your caregiver. If you were given medications for your condition, do not drive, operate machinery or power tools, or sign legal documents for 24 hours. Do not drink alcohol. Do not take sleeping pills or other medications that may interfere with treatment.  Continue all activities unless the activities cause more pain. When the pain lessens, slowly resume normal activities. Gradually increase the intensity and duration of the activities or exercise.  During periods of severe pain, bed rest may be helpful. Lay or sit in any position that is comfortable.  Putting ice on the injured area.  Put ice in a bag.  Place a towel between your skin and the bag.  Leave the ice on for 15 to 20 minutes, 3 to 4 times a day.  Follow up with your caregiver for continued problems and no reason can be found for the pain. If the pain becomes worse or does not go away, it may be necessary to repeat tests or do additional testing. Your caregiver may need to look further for a possible cause. SEEK IMMEDIATE MEDICAL CARE IF:  You have pain that is getting worse and is not relieved by medications.  You develop chest pain that is associated with shortness or breath, sweating, feeling sick to your stomach (nauseous), or throw up (vomit).  Your pain becomes localized to the abdomen.  You develop any new symptoms that seem different or that concern you. MAKE SURE YOU:   Understand these instructions.  Will watch your condition.  Will get help right away if you are not doing well or get worse. Document Released: 11/11/2005 Document Revised: 02/03/2012 Document Reviewed: 07/16/2013 [facility] Patient Information 2015 [place], [place]. This information is  not intended to replace advice given to you by your health care provider. Make sure you discuss any questions you have with your health care provider.   Viral Infections A virus is a type of germ. Viruses can cause:  Minor sore throats.  Aches and pains.  Headaches.  Runny nose.  Rashes.  Watery eyes.  Tiredness.  Coughs.  Loss of appetite.  Feeling sick to your stomach (nausea).  Throwing up (vomiting).  Watery poop (diarrhea). HOME CARE   Only take medicines as told by your doctor.  Drink enough water and fluids to keep your pee (urine) clear or pale yellow. Sports drinks are a good choice.  Get plenty of rest and eat healthy. Soups and broths with crackers or rice are fine. GET HELP RIGHT AWAY IF:   You have a very bad headache.  You have shortness of breath.  You have chest pain or neck pain.  You have an unusual rash.  You cannot stop throwing up.  You have watery poop that does not stop.  You cannot keep fluids down.  You or your child has a temperature by mouth above 102 F (38.9 C), not controlled by medicine.  Your baby is older than 3 months with a rectal temperature of 102 F (38.9 C)  or higher.  Your baby is 19 months old or younger with a rectal temperature of 100.4 F (38 C) or higher. MAKE SURE YOU:   Understand these instructions.  Will watch this condition.  Will get help right away if you are not doing well or get worse. Document Released: 10/24/2008 Document Revised: 02/03/2012 Document Reviewed: 03/19/2011 [facility] Patient Information 2015 [place], [place]. This information is not intended to replace advice given to you by your health care provider. Make sure you discuss any questions you have with your health care provider.

## 2014-11-26 NOTE — ED Notes (Signed)
Pt reports she has been having a white discharge with a faint odor. Pt reports lower abd cramping, denies dysuria and itching. Pt also reports generalized body pain that started 3 days ago. Pt denies any injury or physical activity that may have caused sx. Pt rates pain 6/10. Pt states she didn't take any pain medication. Pt also reports loose stools.  Marland Kitchen

## 2014-11-26 NOTE — ED Notes (Signed)
Pt. reports vaginal discharge / generalized body aches onset 3 days ago , denies fever or chills. No dysuria .

## 2014-11-27 LAB — RPR

## 2014-11-27 LAB — HIV ANTIBODY (ROUTINE TESTING W REFLEX): HIV 1&2 Ab, 4th Generation: NONREACTIVE

## 2014-11-30 LAB — GC/CHLAMYDIA PROBE AMP
CT PROBE, AMP APTIMA: NEGATIVE
GC PROBE AMP APTIMA: NEGATIVE

## 2014-12-13 ENCOUNTER — Encounter (HOSPITAL_COMMUNITY): Payer: Self-pay | Admitting: Neurology

## 2014-12-13 ENCOUNTER — Emergency Department (HOSPITAL_COMMUNITY)
Admission: EM | Admit: 2014-12-13 | Discharge: 2014-12-13 | Disposition: A | Discharge status: Home / Self Care | Payer: Self-pay | Attending: Emergency Medicine | Admitting: Emergency Medicine

## 2014-12-13 DIAGNOSIS — N898 Other specified noninflammatory disorders of vagina: Secondary | ICD-10-CM | POA: Insufficient documentation

## 2014-12-13 DIAGNOSIS — Z87891 Personal history of nicotine dependence: Secondary | ICD-10-CM | POA: Insufficient documentation

## 2014-12-13 DIAGNOSIS — Z3202 Encounter for pregnancy test, result negative: Secondary | ICD-10-CM | POA: Insufficient documentation

## 2014-12-13 DIAGNOSIS — M25571 Pain in right ankle and joints of right foot: Secondary | ICD-10-CM | POA: Insufficient documentation

## 2014-12-13 LAB — POC URINE PREG, ED: Preg Test, Ur: NEGATIVE

## 2014-12-13 LAB — URINALYSIS, ROUTINE W REFLEX MICROSCOPIC
Bilirubin Urine: NEGATIVE
Glucose, UA: NEGATIVE mg/dL
Hgb urine dipstick: NEGATIVE
Ketones, ur: NEGATIVE mg/dL
Leukocytes, UA: NEGATIVE
Nitrite: NEGATIVE
Protein, ur: NEGATIVE mg/dL
Specific Gravity, Urine: 1.019 (ref 1.005–1.030)
Urobilinogen, UA: 0.2 mg/dL (ref 0.0–1.0)
pH: 5.5 (ref 5.0–8.0)

## 2014-12-13 LAB — WET PREP, GENITAL
Clue Cells Wet Prep HPF POC: NONE SEEN
Trich, Wet Prep: NONE SEEN
Yeast Wet Prep HPF POC: NONE SEEN

## 2014-12-13 MED ORDER — NAPROXEN 500 MG PO TABS: 500.0000 mg | ORAL_TABLET | Freq: Two times a day (BID) | ORAL | Status: DC

## 2014-12-13 NOTE — ED Provider Notes (Signed)
CSN: [id number]     Arrival date & time 12/13/14  1512 History   First MD Initiated Contact with Patient 12/13/14 1534     Chief Complaint  Patient presents with  . Vaginal Discharge     (Consider location/radiation/quality/duration/timing/severity/associated sxs/prior Treatment) The history is provided by the patient and medical records.    This is a 24 year old female with no significant past medical history presenting to the ED for vaginal discharge for the past 3 days. Patient is in the ED on 11-26-14 for similar, treated for BV and completed course of flagyl. States initially her symptoms resolved but have returned again. She does admit to continued unprotected sexual intercourse but with same partner. She denies any abdominal pain, pelvic pain, dysuria.  Patient does have history of STDs, none recently.  History of irregular menstrual cycles as well, unknown cause.  Has been evaluated by GYN for this.  Patient also notes some right ankle pain, worse along the posterior aspect of lateral right ankle. She denies any known injury, calm, or falls. States she only has pain with walking. No numbness or paresthesias. No intervention tried prior to arrival.  History reviewed. No pertinent past medical history. History reviewed. No pertinent past surgical history. Family History  Problem Relation Age of Onset  . Hypertension Mother   . Heart disease Mother   . Stroke Mother   . Diabetes Maternal Grandmother    History  Substance Use Topics  . Smoking status: Former Smoker -- 0.25 packs/day    Types: Cigarettes  . Smokeless tobacco: Never Used  . Alcohol Use: Yes     Comment: occasion   OB History    No data available     Review of Systems  Genitourinary: Positive for vaginal discharge.  Musculoskeletal: Positive for arthralgias.  All other systems reviewed and are negative.     Allergies  Apple; Banana; Orange fruit; Lactase-lactobacillus; Spinach; and Vicodin  Home  Medications   Prior to Admission medications   Medication Sig Start Date End Date Taking? Authorizing Provider  metroNIDAZOLE (METROGEL VAGINAL) 0.75 % vaginal gel Place 1 Applicatorful vaginally 2 (two) times daily. 11/26/14   Olivia Mackie, MD  naproxen (NAPROSYN) 500 MG tablet Take 1 tablet (500 mg total) by mouth 2 (two) times daily with a meal. 11/26/14   Olivia Mackie, MD   BP 120/65 mmHg  Pulse 90  Temp(Src) 98.1 F (36.7 C) (Oral)  Resp 20  SpO2 98%   Physical Exam  Constitutional: She is oriented to person, place, and time. She appears well-developed and well-nourished. No distress.  HENT:  Head: Normocephalic and atraumatic.  Mouth/Throat: Oropharynx is clear and moist.  Eyes: Conjunctivae and EOM are normal. Pupils are equal, round, and reactive to light.  Neck: Normal range of motion. Neck supple.  Cardiovascular: Normal rate, regular rhythm and normal heart sounds.   Pulmonary/Chest: Effort normal and breath sounds normal. No respiratory distress. She has no wheezes.  Abdominal: Soft. Bowel sounds are normal. There is no tenderness. There is no guarding.  Genitourinary: There is no lesion on the right labia. There is no lesion on the left labia. Cervix exhibits no motion tenderness. Right adnexum displays no tenderness. Left adnexum displays no tenderness. No bleeding in the vagina. No foreign body around the vagina. Vaginal discharge (scant, yellow) found.  Normal female external genitalia without visible lesions; scant amount of yellow/white vaginal discharge, cervical os closed, no bleeding; no adnexal or cervical motion tenderness  Musculoskeletal: Normal range  of motion.       Right ankle: Normal.  Right ankle normal in appearance without visible swelling or bony deformity, full range of motion maintained; endorses pain along the posterior lateral ankle when walking, but is ambulatory without difficulty  Neurological: She is alert and oriented to person, place, and time.   Skin: Skin is warm and dry. She is not diaphoretic.  Psychiatric: She has a normal mood and affect.  Nursing note and vitals reviewed.   ED Course  Procedures (including critical care time) Labs Review Labs Reviewed  WET PREP, GENITAL - Abnormal; Notable for the following:    WBC, Wet Prep HPF POC MODERATE (*)    All other components within normal limits  URINALYSIS, ROUTINE W REFLEX MICROSCOPIC  POC URINE PREG, ED  GC/CHLAMYDIA PROBE AMP ()    Imaging Review No results found.   EKG Interpretation None      MDM   Final diagnoses:  Vaginal discharge  Right ankle pain   24 year old female with vaginal discharge. She was seen earlier this month for the same. Abdominal exam is benign. Pelvic exam with scant amount of vaginal discharge, no adnexal or cervical motion tenderness. Wet prep with few WBCs, overall unimpressive.  Gc/chl pending.  Urine pregnancy negative. UA noninfectious. Right ankle pain without known injury. Low suspicion for acute fracture/dislocation, possibly tendinitis. Patient given anti-inflammatories. She was [facility] issues with vaginal discharge, encouraged safe sex practices.  Discussed plan with patient, he/she acknowledged understanding and agreed with plan of care.  Return precautions given for new or worsening symptoms.  Garlon HatchetLisa M Trayson Stitely, PA-C 12/13/14 1712  Geoffery Lyonsouglas Delo, MD 12/13/14 [phone]

## 2014-12-13 NOTE — ED Notes (Signed)
Pt reports yellow vaginal discharge for 3 days. Also reports soreness, when urinating. Pain to right ankle for 3 days, no injury. She woke up and had ankle pain 3 days ago

## 2014-12-13 NOTE — ED Notes (Signed)
Pt ambulatory to restroom

## 2014-12-13 NOTE — Discharge Instructions (Signed)
Take the prescribed medication as directed. Follow-up with women's clinic for further GYN issues. Return to the ED for new or worsening symptoms.

## 2014-12-15 LAB — GC/CHLAMYDIA PROBE AMP (~~LOC~~) NOT AT ARMC
Chlamydia: NEGATIVE
Neisseria Gonorrhea: NEGATIVE

## 2015-01-25 ENCOUNTER — Encounter (HOSPITAL_COMMUNITY): Payer: Self-pay | Admitting: Emergency Medicine

## 2015-01-25 DIAGNOSIS — N898 Other specified noninflammatory disorders of vagina: Secondary | ICD-10-CM | POA: Insufficient documentation

## 2015-01-25 DIAGNOSIS — Z792 Long term (current) use of antibiotics: Secondary | ICD-10-CM | POA: Insufficient documentation

## 2015-01-25 DIAGNOSIS — Z87891 Personal history of nicotine dependence: Secondary | ICD-10-CM | POA: Insufficient documentation

## 2015-01-25 DIAGNOSIS — Z3202 Encounter for pregnancy test, result negative: Secondary | ICD-10-CM | POA: Insufficient documentation

## 2015-01-25 LAB — POC URINE PREG, ED: Preg Test, Ur: NEGATIVE

## 2015-01-25 LAB — URINALYSIS, ROUTINE W REFLEX MICROSCOPIC
Bilirubin Urine: NEGATIVE
Glucose, UA: NEGATIVE mg/dL
HGB URINE DIPSTICK: NEGATIVE
Ketones, ur: NEGATIVE mg/dL
Nitrite: NEGATIVE
PH: 6 (ref 5.0–8.0)
Protein, ur: NEGATIVE mg/dL
Specific Gravity, Urine: 1.021 (ref 1.005–1.030)
UROBILINOGEN UA: 0.2 mg/dL (ref 0.0–1.0)

## 2015-01-25 LAB — CBC WITH DIFFERENTIAL/PLATELET
Basophils Absolute: 0 10*3/uL (ref 0.0–0.1)
Basophils Relative: 0 % (ref 0–1)
EOS PCT: 5 % (ref 0–5)
Eosinophils Absolute: 0.4 10*3/uL (ref 0.0–0.7)
HEMATOCRIT: 37.2 % (ref 36.0–46.0)
Hemoglobin: 12.5 g/dL (ref 12.0–15.0)
LYMPHS ABS: 2 10*3/uL (ref 0.7–4.0)
Lymphocytes Relative: 27 % (ref 12–46)
MCH: 29.6 pg (ref 26.0–34.0)
MCHC: 33.6 g/dL (ref 30.0–36.0)
MCV: 88.2 fL (ref 78.0–100.0)
MONO ABS: 0.4 10*3/uL (ref 0.1–1.0)
MONOS PCT: 6 % (ref 3–12)
Neutro Abs: 4.7 10*3/uL (ref 1.7–7.7)
Neutrophils Relative %: 62 % (ref 43–77)
PLATELETS: 267 10*3/uL (ref 150–400)
RBC: 4.22 MIL/uL (ref 3.87–5.11)
RDW: 12.5 % (ref 11.5–15.5)
WBC: 7.4 10*3/uL (ref 4.0–10.5)

## 2015-01-25 LAB — URINE MICROSCOPIC-ADD ON

## 2015-01-25 LAB — BASIC METABOLIC PANEL
Anion gap: 7 (ref 5–15)
BUN: 6 mg/dL (ref 6–23)
CO2: 31 mmol/L (ref 19–32)
CREATININE: 0.84 mg/dL (ref 0.50–1.10)
Calcium: 9.2 mg/dL (ref 8.4–10.5)
Chloride: 99 mmol/L (ref 96–112)
GFR calc Af Amer: >90 mL/min (ref >90)
GFR calc non Af Amer: >90 mL/min (ref >90)
Glucose, Bld: 99 mg/dL (ref 70–99)
Potassium: 4.1 mmol/L (ref 3.5–5.1)
Sodium: 137 mmol/L (ref 135–145)

## 2015-01-25 NOTE — ED Notes (Signed)
Pt c/o lower abd pain with foul smelling vaginal discharge.  Onset yesterday

## 2015-01-26 ENCOUNTER — Emergency Department (HOSPITAL_COMMUNITY)
Admission: EM | Admit: 2015-01-26 | Discharge: 2015-01-26 | Disposition: A | Discharge status: Home / Self Care | Payer: Self-pay | Attending: Emergency Medicine | Admitting: Emergency Medicine

## 2015-01-26 DIAGNOSIS — N898 Other specified noninflammatory disorders of vagina: Secondary | ICD-10-CM

## 2015-01-26 LAB — WET PREP, GENITAL
Clue Cells Wet Prep HPF POC: NONE SEEN
Trich, Wet Prep: NONE SEEN
WBC WET PREP: NONE SEEN
Yeast Wet Prep HPF POC: NONE SEEN

## 2015-01-26 LAB — GC/CHLAMYDIA PROBE AMP (~~LOC~~) NOT AT ARMC
Chlamydia: NEGATIVE
Neisseria Gonorrhea: POSITIVE — AB

## 2015-01-26 NOTE — Discharge Instructions (Signed)
Please follow the directions provided.  Be sure to follow-up with the women's clinic to establish routine care.  Practice good hygiene and use protection during intercourse.  Don't hesitate to return for any new, worsening, or concerning symptoms.  SEEK MEDICAL CARE IF:  You have abdominal pain.  You have a fever or persistent symptoms for more than 2-3 days.  You have a fever and your symptoms suddenly get worse.    Emergency Department Resource Guide 1) Find a Doctor and Pay Out of Pocket Although you won't have to find out who is covered by your insurance plan, it is a good idea to ask around and get recommendations. You will then need to call the office and see if the doctor you have chosen will accept you as a new patient and what types of options they offer for patients who are self-pay. Some doctors offer discounts or will set up payment plans for their patients who do not have insurance, but you will need to ask so you aren't surprised when you get to your appointment.  2) Contact Your Local Health Department Not all health departments have doctors that can see patients for sick visits, but many do, so it is worth a call to see if yours does. If you don't know where your local health department is, you can check in your phone book. The CDC also has a tool to help you locate your state's health department, and many state websites also have listings of all of their local health departments.  3) Find a Walk-in Clinic If your illness is not likely to be very severe or complicated, you may want to try a walk in clinic. These are popping up all over the country in pharmacies, drugstores, and shopping centers. They're usually staffed by nurse practitioners or physician assistants that have been trained to treat common illnesses and complaints. They're usually fairly quick and inexpensive. However, if you have serious medical issues or chronic medical problems, these are probably not your best  option.  No Primary Care Doctor: - Call Health Connect at  [phone] - they can help you locate a primary care doctor that  accepts your insurance, provides certain services, etc. - Physician Referral Service- [phone]  Chronic Pain Problems: Organization         Address  Phone   Notes  [facility]  [phone] Patients need to be referred by their primary care doctor.   Medication Assistance: Organization         Address  Phone   Notes  [facility] [address] [place], [place] [address] [phone] --Must be a resident of [place] -- Must have NO insurance coverage whatsoever (no Medicaid/ Medicare, etc.) -- The pt. MUST have a primary care doctor that directs their care regularly and follows them in the community   MedAssist  [phone]   [employer]  [phone]    Agencies that provide inexpensive medical care: Organization         Address  Phone   Notes  [facility]  [phone]   [facility]    [phone]   [facility] [address] [place], [place] [address] [phone]   [facility] [address], [place] [phone]   [facility]    [phone]   [facility]    [phone]  [facility]  [address], [place] Phone:  [phone], Fax:  [phone] Hours of Operation:  9 am - 6 pm, M-F.  Also accepts Medicaid/Medicare and self-pay.  [facility] for [place] [address], [address], [place] Phone: [phone], Fax: [phone]. Hours of Operation:  8:30 am - 5:30 pm, M-F.  Also accepts Medicaid and self-pay.  [facility] [place] [address] [place] Phone: [phone]   [place], [place], [place] [phone], Ext. [phone] Mondays & Thursdays: 7-9 AM.  First 15  patients are seen on a first come, first serve basis.    [place] Providers:  Organization         Address  Phone   Notes  [facility] [address],  [phone] Also accepts self-pay patients.  [facility] [address], [place]  [phone]   [place], [address], [place] [phone]   [facility] [address], [place] [phone]   Lucianne Lei [address], [place]   [phone] Only accepts [place] Access [place] patients after they have their name applied to their card.   Self-Pay (no insurance) in [facility]:  Organization         Address  Phone   Notes  Sickle Cell Patients, [facility] Internal Medicine [place] [phone]   [facility] [place] [phone]   [facility] [place]  [place], [place], [place] [phone]   [facility]/Dr. Osei-Bonsu  [address] [place] or [address], [place] [phone] Phone number for both [place] and [place] locations is the same.  Urgent Medical and [facility] [address], [place] [phone]   [facility] [address], [place] or [address] [phone]   [facility] [address], [place] [phone], phone; [phone], fax Sees patients 1st and 3rd Saturday of every month.  Must not qualify for public or private insurance (i.e. Medicaid, Medicare, [place] Health Choice, Veterans' Benefits)  Household income should be no more than 200% of the poverty level The clinic cannot treat you if you are pregnant or think you are pregnant  Sexually transmitted diseases are not treated at the clinic.    Dental  Care: Organization         Address  Phone  Notes  [facility] Regino Ramirez [phone] Accepts children up to age 62 who are enrolled in [place] or [place]; pregnant women with a Medicaid card; and children who have applied for Medicaid or [place] Health Choice, but were declined, whose parents can pay a reduced fee at time of service.  [facility] Department of [facility]  [address], [place] [phone] Accepts children up to age 71 who are enrolled in [place] or [place]; pregnant women with a Medicaid card; and children who have applied for Medicaid or [place] Health Choice, but were declined, whose parents can pay a reduced fee at time of service.  [place]  [phone]  [place] [phone] Patients are seen by appointment only. Walk-ins are not accepted. [place] will see patients 26 years of age and older. Monday - Tuesday (8am-5pm) Most Wednesdays (8:30-5pm) $30 per visit, cash only  [facility] Adult Dental Access PROGRAM  [address], [facility] [phone] Patients are seen by appointment only. Walk-ins are not accepted. [place] will see patients 62 years of age and older. One Wednesday Evening (Monthly: Volunteer Based).  $30 per visit, cash only  [place]  [phone] for adults; Children under age 39, call Graduate Pediatric Dentistry at [phone]. Children aged 75-14, please call [phone] to request a pediatric application.  Dental services are provided in all areas of dental care including fillings, crowns and bridges, complete and partial dentures, implants, gum treatment, root canals, and extractions. Preventive care is also provided. Treatment is provided to both adults and children. Patients are selected via a lottery and there is often a waiting list.   [facility] [address] [place]  [phone] [email or web address]   [facility] [address] [place], [place] [phone], Ext. [phone] Second and Fourth Thursday of each month, opens at 6:30 AM; Clinic ends at 9 AM.  Patients are seen on a first-come first-served basis, and a limited number are seen during each clinic.   [facility]  [address] Hillard Danker [place], [place] [phone]   Eligibility Requirements You must have lived in [place], [place], or [place] counties for at least the last three months.   You cannot be eligible for state or federal sponsored [employer], including [employer], [place], or [employer].   You generally cannot be eligible for healthcare insurance through your employer.    How to apply: Eligibility screenings are held every Tuesday and Wednesday afternoon from 1:00 pm until 4:00 pm. You do not need an appointment for the interview!  [facility] [address] [place], [place]   [place]  [place] Department  [place]  [phone]    Behavioral Health Resources in the Community: Intensive Outpatient Programs Organization         Address  Phone  Notes  [place]. [address] [place], [place] [phone]   [facility]patient [address] [place], [place]   ADS: Alcohol & Drug Svcs [address], [place], [place]   [place] [address]  [place], [place] or [phone]   Substance Abuse Resources Organization         Address  Phone  Notes  Alcohol and Drug Services  [phone]   [place]  [phone]   The [place]   Chinita Pester  [phone]   Residential & Outpatient Substance Abuse Program  [phone]    Psychological Services Organization         Address  Phone  Notes  [facility] [place]  [place]  [phone]   [place] [address] [place] or [phone]    Mobile Crisis Teams Organization         Address  Phone  Notes  [facility]atives, Mobile Crisis Care Unit  [phone]   Assertive Psychotherapeutic Services  [address] [place], [place]  [facility] [address], [address] [place] [phone]    Self-Help/Support Groups Organization         Address  Phone             Notes  [facility] [place] - variety of support groups  Jessie Call for more information  Narcotics Anonymous (NA), Caring Services [address], [employer]  2 meetings at this location   Special educational needs teacher         Address  Phone  Notes  ASAP Residential Treatment [place],    [place]  [phone]   [facility]  [address], [place] [phone], [place], [place]   [place], [place] [phone] Admissions: 8am-3pm M-F  Incentives Substance [place] [address]    [place], [place] [phone]   [facility] [address] [place], [place], [place]   [facility] [address]  [place], [place]   Insight Programs - Intensive Outpatient [address] Kristeen [address], [place], [place]   [facility] [place].,  [place], [place] [phone] or [phone]   [facility]idential Treatment Services (RTS) [address] [place], [place] Accepts Medicaid  [facility] [place] [address]  [place] [phone] Substance Abuse/Addiction Treatment   [facility] Organization         Address  Phone  Notes  [facility]  [phone]   Domenic Schwab, PhD [address] [place], [place]   [phone] or [phone]   [place], [place] [phone]   [facility] [address] [place], [place] [phone] Insurance/Medicaid/sponsorship through [facility] and Families [address], [address] [place]                                    [place], [place] [phone] [place] [address], [place] [phone]    Dr. Adele Schilder  [phone]   [facility] Dept. 1) [address], [place] 2) [place] 3)  [address], [place] [phone]  [phone]   [place] [phone] or [phone] (After Hours)

## 2015-01-26 NOTE — ED Provider Notes (Signed)
CSN: [id number]     Arrival date & time 01/25/15  2251 History   First MD Initiated Contact with Patient 01/26/15 0009     Chief Complaint  Patient presents with  . Abdominal Pain   (Consider location/radiation/quality/duration/timing/severity/associated sxs/prior Treatment) HPI Stephanie Reid is a 24 yo female presenting with report of vaginal discharge.  She states she was seen a month ago for vaginal discharge vaginal odor and was discharged without any findings.  She states the discharge and odor has continued.  She describes the discharge as yellow and thick.  She denies any abdominal pain, vaginal pain, dysuria or pain with intercourse.   History reviewed. No pertinent past medical history. History reviewed. No pertinent past surgical history. Family History  Problem Relation Age of Onset  . Hypertension Mother   . Heart disease Mother   . Stroke Mother   . Diabetes Maternal Grandmother    History  Substance Use Topics  . Smoking status: Former Smoker -- 0.25 packs/day    Types: Cigarettes  . Smokeless tobacco: Never Used  . Alcohol Use: Yes     Comment: occasion   OB History    No data available     Review of Systems  Constitutional: Negative for fever and chills.  HENT: Negative for sore throat.   Eyes: Negative for visual disturbance.  Respiratory: Negative for cough.   Cardiovascular: Negative for chest pain.  Gastrointestinal: Negative for nausea, vomiting, abdominal pain and diarrhea.  Genitourinary: Positive for vaginal discharge. Negative for dysuria, flank pain, vaginal bleeding, pelvic pain and dyspareunia.  Musculoskeletal: Negative for myalgias.  Skin: Negative for rash.  Neurological: Negative for headaches.    Allergies  Apple; Banana; Orange fruit; Lactase-lactobacillus; Spinach; and Vicodin  Home Medications   Prior to Admission medications   Medication Sig Start Date End Date Taking? Authorizing Provider  metroNIDAZOLE (METROGEL VAGINAL)  0.75 % vaginal gel Place 1 Applicatorful vaginally 2 (two) times daily. Patient not taking: Reported on 12/13/2014 11/26/14   Olivia Mackielga M Otter, MD  miconazole (MICOTIN) 100 MG vaginal suppository Place 100 mg vaginally at bedtime.    Historical Provider, MD  naproxen (NAPROSYN) 500 MG tablet Take 1 tablet (500 mg total) by mouth 2 (two) times daily with a meal. Patient not taking: Reported on 01/26/2015 12/13/14   Garlon HatchetLisa M Sanders, PA-C   BP 111/74 mmHg  Pulse 90  Temp(Src) 98 F (36.7 C) (Oral)  Resp 16  Ht 5\' 6"  (1.676 m)  Wt 210 lb (95.255 kg)  BMI 33.91 kg/m2  SpO2 100% Physical Exam  Constitutional: She appears well-developed and well-nourished. No distress.  HENT:  Head: Normocephalic and atraumatic.  Mouth/Throat: Oropharynx is clear and moist. No oropharyngeal exudate.  Eyes: Conjunctivae are normal.  Neck: Neck supple. No thyromegaly present.  Cardiovascular: Normal rate, regular rhythm and intact distal pulses.   Pulmonary/Chest: Effort normal and breath sounds normal. No respiratory distress.  Abdominal: Soft. She exhibits no distension and no mass. There is no tenderness. There is no rigidity, no rebound, no guarding, no CVA tenderness, no tenderness at McBurney's point and negative Murphy's sign.  Genitourinary: Vagina normal. There is no tenderness on the right labia. There is no tenderness on the left labia. Uterus is not enlarged and not tender. Cervix exhibits no motion tenderness, no discharge and no friability. Right adnexum displays no mass and no tenderness. Left adnexum displays no mass and no tenderness. No bleeding in the vagina. No vaginal discharge found.  No significant discharge,  odor or concerning findings on pelvic exam  Musculoskeletal: She exhibits no tenderness.  Lymphadenopathy:    She has no cervical adenopathy.  Neurological: She is alert.  Skin: Skin is warm and dry. No rash noted. She is not diaphoretic.  Psychiatric: She has a normal mood and affect.   Nursing note and vitals reviewed.   ED Course  Procedures (including critical care time) Labs Review Labs Reviewed  URINALYSIS, ROUTINE W REFLEX MICROSCOPIC - Abnormal; Notable for the following:    APPearance CLOUDY (*)    Leukocytes, UA SMALL (*)    All other components within normal limits  GC/CHLAMYDIA PROBE AMP ([place]) - Abnormal; Notable for the following:    Neisseria gonorrhea **NG: POSITIVE** (*)    All other components within normal limits  WET PREP, GENITAL  CBC WITH DIFFERENTIAL/PLATELET  BASIC METABOLIC PANEL  URINE MICROSCOPIC-ADD ON  POC URINE PREG, ED    Imaging Review No results found.   EKG Interpretation None      MDM   Final diagnoses:  Vaginal discharge   24 yo with report of continued vag discharge and odor despite normal pelvic exam 1 month ago.  Her pelvic exam appeared normal today with no pain or CMT during the exam, no odor appreciated and no significant discharge noted.  She does report she has not had a menstrual period in several years but she has a negative pregnancy test and negative wet prep.   Discussed establishing care with a Ob/Gyn for mgmt preventive care and further follow-up.  Pt is well appearing and aware of plan and in agreement.    Filed Vitals:   01/25/15 2256 01/26/15 0016 01/26/15 0030 01/26/15 0153  BP: 111/74  103/53 117/68  Pulse: 90  77 75  Temp: 98 F (36.7 C)   98.1 F (36.7 C)  TempSrc: Oral   Oral  Resp: 16   17  Height:  (1.676 m)     Weight: 210 lb (95.255 kg)     SpO2: 100% 100% 100% 100%   Meds given in ED:  Medications - No data to display  New Prescriptions   No medications on file       Harle Battiest, NP 01/27/15 1315  Loren Racer, MD 02/01/15 [id number]

## 2015-01-26 NOTE — ED Notes (Signed)
Pt a/o x 4 on d/c. 

## 2015-02-01 ENCOUNTER — Telehealth (HOSPITAL_COMMUNITY): Payer: Self-pay

## 2015-02-01 NOTE — ED Notes (Signed)
Chart returned from EDP office. Reviewed by DR Micheline Mazeocherty. Pt needs azithromycin 1 gram po x 1 and Cefixime 400mg  po x 1. DHHS form faxed. Attempting to contact pt.

## 2015-02-02 ENCOUNTER — Telehealth (HOSPITAL_BASED_OUTPATIENT_CLINIC_OR_DEPARTMENT_OTHER): Payer: Self-pay | Admitting: Emergency Medicine

## 2015-02-03 ENCOUNTER — Encounter (HOSPITAL_COMMUNITY): Payer: Self-pay | Admitting: Emergency Medicine

## 2015-02-03 ENCOUNTER — Emergency Department (HOSPITAL_COMMUNITY)
Admission: EM | Admit: 2015-02-03 | Discharge: 2015-02-03 | Disposition: A | Discharge status: Home / Self Care | Payer: Self-pay | Attending: Emergency Medicine | Admitting: Emergency Medicine

## 2015-02-03 DIAGNOSIS — Z87891 Personal history of nicotine dependence: Secondary | ICD-10-CM | POA: Insufficient documentation

## 2015-02-03 DIAGNOSIS — G43009 Migraine without aura, not intractable, without status migrainosus: Secondary | ICD-10-CM | POA: Insufficient documentation

## 2015-02-03 MED ORDER — KETOROLAC TROMETHAMINE 30 MG/ML IJ SOLN
30.0000 mg | Freq: Once | INTRAMUSCULAR | Status: AC
  Administered 2015-02-03: 30 mg via INTRAVENOUS
  Filled 2015-02-03: qty 1

## 2015-02-03 MED ORDER — SODIUM CHLORIDE 0.9 % IV SOLN
1000.0000 mL | Freq: Once | INTRAVENOUS | Status: AC
  Administered 2015-02-03: 1000 mL via INTRAVENOUS

## 2015-02-03 MED ORDER — METOCLOPRAMIDE HCL 10 MG PO TABS: 10.0000 mg | ORAL_TABLET | Freq: Four times a day (QID) | ORAL | Status: DC | PRN

## 2015-02-03 MED ORDER — METOCLOPRAMIDE HCL 5 MG/ML IJ SOLN
10.0000 mg | Freq: Once | INTRAMUSCULAR | Status: AC
  Administered 2015-02-03: 10 mg via INTRAVENOUS
  Filled 2015-02-03: qty 2

## 2015-02-03 MED ORDER — SODIUM CHLORIDE 0.9 % IV SOLN: 1000.0000 mL | INTRAVENOUS | Status: DC

## 2015-02-03 MED ORDER — DIPHENHYDRAMINE HCL 50 MG/ML IJ SOLN
25.0000 mg | Freq: Once | INTRAMUSCULAR | Status: AC
  Administered 2015-02-03: 25 mg via INTRAVENOUS
  Filled 2015-02-03: qty 1

## 2015-02-03 NOTE — ED Notes (Addendum)
Pt reports having generalized body aches, headache, and fatigue X 2 days.  Denies fevers- admits to nausea.

## 2015-02-03 NOTE — ED Provider Notes (Signed)
CSN: [id number]     Arrival date & time 02/03/15  4098 History  This chart was scribed for Dione Booze, MD by Richarda Overlie, ED Scribe. This patient was seen in room A03C/A03C and the patient's care was started 3:38 AM.    Chief Complaint  Patient presents with  . Generalized Body Aches   The history is provided by the patient. No language interpreter was used.   HPI Comments: BEMNET TROVATO is a 24 y.o. female who presents to the Emergency Department complaining of constant HA that started 2 days ago. She describes her pain as throbbing and rates her pain as a 7/10 at this time. Pt reports associated chills, sweats, photophobia and body aches as symptoms. Pt states she has taken no medications for her symptoms because she vomits if she takes pills. She says that she has not been able to sleep well due to her symtpoms. Pt states that she has never had a similar HA in the past. She denies fever, blurry vision or double vision.   History reviewed. No pertinent past medical history. History reviewed. No pertinent past surgical history. Family History  Problem Relation Age of Onset  . Hypertension Mother   . Heart disease Mother   . Stroke Mother   . Diabetes Maternal Grandmother    History  Substance Use Topics  . Smoking status: Former Smoker -- 0.25 packs/day    Types: Cigarettes  . Smokeless tobacco: Never Used  . Alcohol Use: Yes     Comment: occasion   OB History    No data available     Review of Systems  Constitutional: Positive for fatigue. Negative for fever.  Eyes: Positive for photophobia. Negative for visual disturbance.  Gastrointestinal: Positive for nausea.  Musculoskeletal: Positive for myalgias.  Neurological: Positive for headaches.  All other systems reviewed and are negative.   Allergies  Apple; Banana; Orange fruit; Lactase-lactobacillus; Spinach; and Vicodin  Home Medications   Prior to Admission medications   Medication Sig Start Date End Date  Taking? Authorizing Provider  metroNIDAZOLE (METROGEL VAGINAL) 0.75 % vaginal gel Place 1 Applicatorful vaginally 2 (two) times daily. Patient not taking: Reported on 12/13/2014 11/26/14   Marisa Severin, MD  miconazole (MICOTIN) 100 MG vaginal suppository Place 100 mg vaginally at bedtime.    Historical Provider, MD  naproxen (NAPROSYN) 500 MG tablet Take 1 tablet (500 mg total) by mouth 2 (two) times daily with a meal. Patient not taking: Reported on 01/26/2015 12/13/14   Garlon Hatchet, PA-C   BP 101/65 mmHg  Pulse 77  Temp(Src) 98 F (36.7 C) (Oral)  Resp 19  Ht  (1.676 m)  Wt 210 lb (95.255 kg)  BMI 33.91 kg/m2  SpO2 98% Physical Exam  Constitutional: She is oriented to person, place, and time. She appears well-developed and well-nourished.  HENT:  Head: Normocephalic and atraumatic.  Eyes: EOM are normal. Pupils are equal, round, and reactive to light. Right eye exhibits no discharge. Left eye exhibits no discharge.  Fundi are normal  Neck: Normal range of motion. Neck supple. No JVD present.  Cardiovascular: Normal rate, regular rhythm and normal heart sounds.   No murmur heard. Pulmonary/Chest: Effort normal and breath sounds normal. She has no wheezes. She has no rales. She exhibits no tenderness.  Abdominal: Soft. Bowel sounds are normal. She exhibits no distension and no mass. There is no tenderness.  Musculoskeletal: Normal range of motion. She exhibits no edema.  Lymphadenopathy:    She  has no cervical adenopathy.  Neurological: She is alert and oriented to person, place, and time. No cranial nerve deficit. Coordination normal.  Skin: Skin is warm and dry. No rash noted.  Psychiatric: She has a normal mood and affect. Her behavior is normal. Thought content normal.  Nursing note and vitals reviewed.   ED Course  Procedures   DIAGNOSTIC STUDIES: Oxygen Saturation is 98% on RA, normal by my interpretation.    COORDINATION OF CARE: 3:43 AM Discussed treatment plan with  pt at bedside and pt agreed to plan.    MDM   Final diagnoses:  Migraine without aura and without status migrainosus, not intractable    Headache with many features suggestive of migraine headache. She will be treated with a migraine cocktail and reassessed.  Following above-noted treatment, patient stated she had excellent relief of symptoms. She is discharged with prescription for metoclopramide.   I personally performed the services described in this documentation, which was scribed in my presence. The recorded information has been reviewed and is accurate.       Dione Boozeavid Orissa Arreaga, MD 02/03/15 [id number]

## 2015-02-03 NOTE — Discharge Instructions (Signed)
Migraine Headache °A migraine headache is an intense, throbbing pain on one or both sides of your head. A migraine can last for 30 minutes to several hours. °CAUSES  °The exact cause of a migraine headache is not always known. However, a migraine may be caused when nerves in the brain become irritated and release chemicals that cause inflammation. This causes pain. °Certain things may also trigger migraines, such as: °· Alcohol. °· Smoking. °· Stress. °· Menstruation. °· Aged cheeses. °· Foods or drinks that contain nitrates, glutamate, aspartame, or tyramine. °· Lack of sleep. °· Chocolate. °· Caffeine. °· Hunger. °· Physical exertion. °· Fatigue. °· Medicines used to treat chest pain (nitroglycerine), birth control pills, estrogen, and some blood pressure medicines. °SIGNS AND SYMPTOMS °· Pain on one or both sides of your head. °· Pulsating or throbbing pain. °· Severe pain that prevents daily activities. °· Pain that is aggravated by any physical activity. °· Nausea, vomiting, or both. °· Dizziness. °· Pain with exposure to bright lights, loud noises, or activity. °· General sensitivity to bright lights, loud noises, or smells. °Before you get a migraine, you may get warning signs that a migraine is coming (aura). An aura may include: °· Seeing flashing lights. °· Seeing bright spots, halos, or zigzag lines. °· Having tunnel vision or blurred vision. °· Having feelings of numbness or tingling. °· Having trouble talking. °· Having muscle weakness. °DIAGNOSIS  °A migraine headache is often diagnosed based on: °· Symptoms. °· Physical exam. °· A CT scan or MRI of your head. These imaging tests cannot diagnose migraines, but they can help rule out other causes of headaches. °TREATMENT °Medicines may be given for pain and nausea. Medicines can also be given to help prevent recurrent migraines.  °HOME CARE INSTRUCTIONS °· Only take over-the-counter or prescription medicines for pain or discomfort as directed by your  health care provider. The use of long-term narcotics is not recommended. °· Lie down in a dark, quiet room when you have a migraine. °· Keep a journal to find out what may trigger your migraine headaches. For example, write down: °¨ What you eat and drink. °¨ How much sleep you get. °¨ Any change to your diet or medicines. °· Limit alcohol consumption. °· Quit smoking if you smoke. °· Get 7-9 hours of sleep, or as recommended by your health care provider. °· Limit stress. °· Keep lights dim if bright lights bother you and make your migraines worse. °SEEK IMMEDIATE MEDICAL CARE IF:  °· Your migraine becomes severe. °· You have a fever. °· You have a stiff neck. °· You have vision loss. °· You have muscular weakness or loss of muscle control. °· You start losing your balance or have trouble walking. °· You feel faint or pass out. °· You have severe symptoms that are different from your first symptoms. °MAKE SURE YOU:  °· Understand these instructions. °· Will watch your condition. °· Will get help right away if you are not doing well or get worse. °Document Released: 11/11/2005 Document Revised: 03/28/2014 Document Reviewed: 07/19/2013 °[employer]® Patient Information ©2015 [employer]. This information is not intended to replace advice given to you by your health care provider. Make sure you discuss any questions you have with your health care provider. ° °Metoclopramide tablets °What is this medicine? °METOCLOPRAMIDE (met oh kloe PRA mide) is used to treat the symptoms of gastroesophageal reflux disease (GERD) like heartburn. It is also used to treat people with slow emptying of the stomach and   intestinal tract. This medicine may be used for other purposes; ask your health care provider or pharmacist if you have questions. COMMON BRAND NAME(S): Reglan What should I tell my health care provider before I take this medicine? They need to know if you have any of these conditions: -breast  cancer -depression -diabetes -heart failure -high blood pressure -kidney disease -liver disease -Parkinson's disease or a movement disorder -pheochromocytoma -seizures -stomach obstruction, bleeding, or perforation -an unusual or allergic reaction to metoclopramide, procainamide, sulfites, other medicines, foods, dyes, or preservatives -pregnant or trying to get pregnant -breast-feeding How should I use this medicine? Take this medicine by mouth with a glass of water. Follow the directions on the prescription label. Take this medicine on an empty stomach, about 30 minutes before eating. Take your doses at regular intervals. Do not take your medicine more often than directed. Do not stop taking except on the advice of your doctor or health care professional. A special MedGuide will be given to you by the pharmacist with each prescription and refill. Be sure to read this information carefully each time. Talk to your pediatrician regarding the use of this medicine in children. Special care may be needed. Overdosage: If you think you have taken too much of this medicine contact a poison control center or emergency room at once. NOTE: This medicine is only for you. Do not share this medicine with others. What if I miss a dose? If you miss a dose, take it as soon as you can. If it is almost time for your next dose, take only that dose. Do not take double or extra doses. What may interact with this medicine? -acetaminophen -cyclosporine -digoxin -medicines for blood pressure -medicines for diabetes, including insulin -medicines for hay fever and other allergies -medicines for depression, especially an Monoamine Oxidase Inhibitor (MAOI) -medicines for Parkinson's disease, like levodopa -medicines for sleep or for pain -tetracycline This list may not describe all possible interactions. Give your health care provider a list of all the medicines, herbs, non-prescription drugs, or dietary  supplements you use. Also tell them if you smoke, drink alcohol, or use illegal drugs. Some items may interact with your medicine. What should I watch for while using this medicine? It may take a few weeks for your stomach condition to start to get better. However, do not take this medicine for longer than 12 weeks. The longer you take this medicine, and the more you take it, the greater your chances are of developing serious side effects. If you are an elderly patient, a female patient, or you have diabetes, you may be at an increased risk for side effects from this medicine. Contact your doctor immediately if you start having movements you cannot control such as lip smacking, rapid movements of the tongue, involuntary or uncontrollable movements of the eyes, head, arms and legs, or muscle twitches and spasms. Patients and their families should watch out for worsening depression or thoughts of suicide. Also watch out for any sudden or severe changes in feelings such as feeling anxious, agitated, panicky, irritable, hostile, aggressive, impulsive, severely restless, overly excited and hyperactive, or not being able to sleep. If this happens, especially at the beginning of treatment or after a change in dose, call your doctor. Do not treat yourself for high fever. Ask your doctor or health care professional for advice. You may get drowsy or dizzy. Do not drive, use machinery, or do anything that needs mental alertness until you know how this drug affects you.  Do not stand or sit up quickly, especially if you are an older patient. This reduces the risk of dizzy or fainting spells. Alcohol can make you more drowsy and dizzy. Avoid alcoholic drinks. What side effects may I notice from receiving this medicine? Side effects that you should report to your doctor or health care professional as soon as possible: -allergic reactions like skin rash, itching or hives, swelling of the face, lips, or tongue -abnormal  production of milk in females -breast enlargement in both males and females -change in the way you walk -difficulty moving, speaking or swallowing -drooling, lip smacking, or rapid movements of the tongue -excessive sweating -fever -involuntary or uncontrollable movements of the eyes, head, arms and legs -irregular heartbeat or palpitations -muscle twitches and spasms -unusually weak or tired Side effects that usually do not require medical attention (report to your doctor or health care professional if they continue or are bothersome): -change in sex drive or performance -depressed mood -diarrhea -difficulty sleeping -headache -menstrual changes -restless or nervous This list may not describe all possible side effects. Call your doctor for medical advice about side effects. You may report side effects to FDA at 1-800-FDA-1088. Where should I keep my medicine? Keep out of the reach of children. Store at room temperature between 20 and 25 degrees C (68 and 77 degrees F). Protect from light. Keep container tightly closed. Throw away any unused medicine after the expiration date. NOTE: This sheet is a summary. It may not cover all possible information. If you have questions about this medicine, talk to your doctor, pharmacist, or health care provider.  2015, [employer]vier/Gold Standard. (2012-03-10 13:04:38)

## 2015-04-13 ENCOUNTER — Encounter (HOSPITAL_COMMUNITY): Payer: Self-pay | Admitting: Emergency Medicine

## 2015-04-13 ENCOUNTER — Emergency Department (HOSPITAL_COMMUNITY)
Admission: EM | Admit: 2015-04-13 | Discharge: 2015-04-13 | Disposition: A | Discharge status: Home / Self Care | Payer: Self-pay | Attending: Emergency Medicine | Admitting: Emergency Medicine

## 2015-04-13 DIAGNOSIS — Z87891 Personal history of nicotine dependence: Secondary | ICD-10-CM | POA: Insufficient documentation

## 2015-04-13 DIAGNOSIS — H109 Unspecified conjunctivitis: Secondary | ICD-10-CM | POA: Insufficient documentation

## 2015-04-13 MED ORDER — ERYTHROMYCIN 5 MG/GM OP OINT
1.0000 "application " | TOPICAL_OINTMENT | Freq: Once | OPHTHALMIC | Status: AC
  Administered 2015-04-13: 1 via OPHTHALMIC
  Filled 2015-04-13: qty 3.5

## 2015-04-13 NOTE — ED Provider Notes (Signed)
CSN: [id number]     Arrival date & time 04/13/15  0044 History  This chart was scribed for Stephanie Reid Stephanie Penning, MD by Annye AsaAnna Dorsett, ED Scribe. This patient was seen in room D35C/D35C and the patient's care was started at 1:58 AM.    Chief Complaint  Patient presents with  . Conjunctivitis   Patient is a 24 y.o. female presenting with conjunctivitis. The history is provided by the patient. No language interpreter was used.  Conjunctivitis This is a new problem. The current episode started 2 days ago. The problem occurs constantly. The problem has been gradually worsening. Exacerbated by: Sherlynn StallsLight. Nothing relieves the symptoms. She has tried nothing for the symptoms. The treatment provided no relief.     HPI Comments: Stephanie Reid is a 24 y.o. female who presents to the Emergency Department complaining of 2 days right eye pain with redness, itching, drainage (described as "clear and white") and photophobia. Patient also notes slight blurring to her vision due to the drainage. She denies fevers, vomiting. She denies recent trauma or injury to the area. Patient does not wear contact lenses.   History reviewed. No pertinent past medical history. History reviewed. No pertinent past surgical history. Family History  Problem Relation Age of Onset  . Hypertension Mother   . Heart disease Mother   . Stroke Mother   . Diabetes Maternal Grandmother    History  Substance Use Topics  . Smoking status: Former Smoker -- 0.25 packs/day    Types: Cigarettes  . Smokeless tobacco: Never Used  . Alcohol Use: Yes     Comment: occasion   OB History    No data available     Review of Systems  Constitutional: Negative for fever.  Eyes: Positive for photophobia, pain, discharge, redness, itching and visual disturbance.  Gastrointestinal: Negative for vomiting.  All other systems reviewed and are negative.     Allergies  Apple; Banana; Orange fruit; Lactase-lactobacillus; Spinach; and  Vicodin  Home Medications   Prior to Admission medications   Medication Sig Start Date End Date Taking? Authorizing Provider  metoCLOPramide (REGLAN) 10 MG tablet Take 1 tablet (10 mg total) by mouth every 6 (six) hours as needed for nausea (or headache). 02/03/15   Dione Boozeavid Glick, MD   BP 106/63 mmHg  Pulse 77  Temp(Src) 98.2 F (36.8 C) (Oral)  Resp 18  Ht 5\' 6"  (1.676 m)  Wt 210 lb (95.255 kg)  BMI 33.91 kg/m2  SpO2 100% Physical Exam  Nursing note and vitals reviewed.   CONSTITUTIONAL: Well developed/well nourished HEAD: Normocephalic/atraumatic EYES: EOMI/PERRL; conjunctival injection to right eye; discharge noted, no corneal hazing, no signs of trauma ENMT: Mucous membranes moist NECK: supple no meningeal signs SPINE/BACK:entire spine nontender CV: S1/S2 noted, no murmurs/rubs/gallops noted LUNGS: Lungs are clear to auscultation bilaterally, no apparent distress NEURO: Pt is awake/alert/appropriate, moves all extremitiesx4.  No facial droop.   EXTREMITIES: pulses normal/equal, full ROM SKIN: warm, color normal PSYCH: no abnormalities of mood noted, alert and oriented to situation  ED Course  Procedures   DIAGNOSTIC STUDIES: Oxygen Saturation is 100% on RA, normal by my interpretation.    COORDINATION OF CARE: 2:01 AM Discussed treatment plan with pt at bedside and pt agreed to plan.    MDM   Final diagnoses:  Conjunctivitis of right eye    Nursing notes including past medical history and social history reviewed and considered in documentation    I personally performed the services described in this documentation, which was  scribed in my presence. The recorded information has been reviewed and is accurate.      Stephanie Reid Hani Patnode, MD 04/13/15 [phone]

## 2015-04-13 NOTE — ED Notes (Signed)
Pt. reports right eye redness with drainage, pain  and itching , denies injury or blurred vision .

## 2015-04-13 NOTE — Discharge Instructions (Signed)

## 2015-05-06 ENCOUNTER — Encounter (HOSPITAL_COMMUNITY): Payer: Self-pay | Admitting: Emergency Medicine

## 2015-05-06 ENCOUNTER — Emergency Department (HOSPITAL_COMMUNITY)
Admission: EM | Admit: 2015-05-06 | Discharge: 2015-05-06 | Disposition: A | Discharge status: Home / Self Care | Payer: Self-pay | Attending: Emergency Medicine | Admitting: Emergency Medicine

## 2015-05-06 DIAGNOSIS — Z3202 Encounter for pregnancy test, result negative: Secondary | ICD-10-CM | POA: Insufficient documentation

## 2015-05-06 DIAGNOSIS — S39012A Strain of muscle, fascia and tendon of lower back, initial encounter: Secondary | ICD-10-CM | POA: Insufficient documentation

## 2015-05-06 DIAGNOSIS — Z87891 Personal history of nicotine dependence: Secondary | ICD-10-CM | POA: Insufficient documentation

## 2015-05-06 DIAGNOSIS — X58XXXA Exposure to other specified factors, initial encounter: Secondary | ICD-10-CM | POA: Insufficient documentation

## 2015-05-06 DIAGNOSIS — Y929 Unspecified place or not applicable: Secondary | ICD-10-CM | POA: Insufficient documentation

## 2015-05-06 DIAGNOSIS — Y939 Activity, unspecified: Secondary | ICD-10-CM | POA: Insufficient documentation

## 2015-05-06 DIAGNOSIS — Y999 Unspecified external cause status: Secondary | ICD-10-CM | POA: Insufficient documentation

## 2015-05-06 LAB — URINALYSIS, ROUTINE W REFLEX MICROSCOPIC
BILIRUBIN URINE: NEGATIVE
Glucose, UA: NEGATIVE mg/dL
Hgb urine dipstick: NEGATIVE
Ketones, ur: NEGATIVE mg/dL
Leukocytes, UA: NEGATIVE
Nitrite: NEGATIVE
Protein, ur: NEGATIVE mg/dL
Specific Gravity, Urine: 1.017 (ref 1.005–1.030)
Urobilinogen, UA: 0.2 mg/dL (ref 0.0–1.0)
pH: 6 (ref 5.0–8.0)

## 2015-05-06 LAB — PREGNANCY, URINE: Preg Test, Ur: NEGATIVE

## 2015-05-06 MED ORDER — METHOCARBAMOL 500 MG PO TABS
1000.0000 mg | ORAL_TABLET | Freq: Once | ORAL | Status: AC
  Administered 2015-05-06: 1000 mg via ORAL
  Filled 2015-05-06: qty 2

## 2015-05-06 MED ORDER — KETOROLAC TROMETHAMINE 60 MG/2ML IM SOLN
60.0000 mg | Freq: Once | INTRAMUSCULAR | Status: AC
  Administered 2015-05-06: 60 mg via INTRAMUSCULAR
  Filled 2015-05-06: qty 2

## 2015-05-06 MED ORDER — HYDROMORPHONE HCL 1 MG/ML IJ SOLN
1.0000 mg | Freq: Once | INTRAMUSCULAR | Status: AC
  Administered 2015-05-06: 1 mg via INTRAMUSCULAR
  Filled 2015-05-06: qty 1

## 2015-05-06 MED ORDER — MELOXICAM 7.5 MG PO TABS: 15.0000 mg | ORAL_TABLET | Freq: Every day | ORAL | Status: DC

## 2015-05-06 MED ORDER — METHOCARBAMOL 500 MG PO TABS: 500.0000 mg | ORAL_TABLET | Freq: Two times a day (BID) | ORAL | Status: DC

## 2015-05-06 MED ORDER — TRAMADOL HCL 50 MG PO TABS: 50.0000 mg | ORAL_TABLET | Freq: Four times a day (QID) | ORAL | Status: DC | PRN

## 2015-05-06 NOTE — Discharge Instructions (Signed)
Alternate ice and heat to areas of injury. Take Mobic and Robaxin as prescribed. Take tramadol as needed for severe pain.  Lumbosacral Strain Lumbosacral strain is a strain of any of the parts that make up your lumbosacral vertebrae. Your lumbosacral vertebrae are the bones that make up the lower third of your backbone. Your lumbosacral vertebrae are held together by muscles and tough, fibrous tissue (ligaments).  CAUSES  A sudden blow to your back can cause lumbosacral strain. Also, anything that causes an excessive stretch of the muscles in the low back can cause this strain. This is typically seen when people exert themselves strenuously, fall, lift heavy objects, bend, or crouch repeatedly. RISK FACTORS  Physically demanding work.  Participation in pushing or pulling sports or sports that require a sudden twist of the back (tennis, golf, baseball).  Weight lifting.  Excessive lower back curvature.  Forward-tilted pelvis.  Weak back or abdominal muscles or both.  Tight hamstrings. SIGNS AND SYMPTOMS  Lumbosacral strain may cause pain in the area of your injury or pain that moves (radiates) down your leg.  DIAGNOSIS Your health care provider can often diagnose lumbosacral strain through a physical exam. In some cases, you may need tests such as X-ray exams.  TREATMENT  Treatment for your lower back injury depends on many factors that your clinician will have to evaluate. However, most treatment will include the use of anti-inflammatory medicines. HOME CARE INSTRUCTIONS   Avoid hard physical activities (tennis, racquetball, waterskiing) if you are not in proper physical condition for it. This may aggravate or create problems.  If you have a back problem, avoid sports requiring sudden body movements. Swimming and walking are generally safer activities.  Maintain good posture.  Maintain a healthy weight.  For acute conditions, you may put ice on the injured area.  Put ice in a  plastic bag.  Place a towel between your skin and the bag.  Leave the ice on for 20 minutes, 2-3 times a day.  When the low back starts healing, stretching and strengthening exercises may be recommended. SEEK MEDICAL CARE IF:  Your back pain is getting worse.  You experience severe back pain not relieved with medicines. SEEK IMMEDIATE MEDICAL CARE IF:   You have numbness, tingling, weakness, or problems with the use of your arms or legs.  There is a change in bowel or bladder control.  You have increasing pain in any area of the body, including your belly (abdomen).  You notice shortness of breath, dizziness, or feel faint.  You feel sick to your stomach (nauseous), are throwing up (vomiting), or become sweaty.  You notice discoloration of your toes or legs, or your feet get very cold. MAKE SURE YOU:   Understand these instructions.  Will watch your condition.  Will get help right away if you are not doing well or get worse. Document Released: 08/21/2005 Document Revised: 11/16/2013 Document Reviewed: 06/30/2013 [facility] Patient Information 2015 [place], [place]. This information is not intended to replace advice given to you by your health care provider. Make sure you discuss any questions you have with your health care provider.

## 2015-05-06 NOTE — ED Provider Notes (Signed)
CSN: [id number]     Arrival date & time 05/06/15  0145 History   First MD Initiated Contact with Patient 05/06/15 [phone]     Chief Complaint  Patient presents with  . Back Pain     (Consider location/radiation/quality/duration/timing/severity/associated sxs/prior Treatment) HPI Comments: 24 year old female with no significant past medical history presents to the emergency department for further evaluation of low back pain. Patient states that symptoms began after work today and have been progressively worsening. She describes an aching pain in her low back which travels down her bilateral posterior legs. She also has worsening pain with position changes. No medications taken prior to arrival. No history of similar symptoms. Patient denies associated fever, dysuria or hematuria, bowel or bladder incontinence, or extremity numbness/weakness. Patient reports that her last menstrual period was 4 years ago secondary to premature menopause. No recent heavy lifting or trauma to her back.  The history is provided by the patient. No language interpreter was used.    History reviewed. No pertinent past medical history. History reviewed. No pertinent past surgical history. Family History  Problem Relation Age of Onset  . Hypertension Mother   . Heart disease Mother   . Stroke Mother   . Diabetes Maternal Grandmother    History  Substance Use Topics  . Smoking status: Former Smoker -- 0.25 packs/day    Types: Cigarettes  . Smokeless tobacco: Never Used  . Alcohol Use: Yes     Comment: occasion   OB History    No data available      Review of Systems  Musculoskeletal: Positive for back pain.  All other systems reviewed and are negative.   Allergies  Apple; Banana; Orange fruit; Lactase-lactobacillus; Spinach; and Vicodin  Home Medications   Prior to Admission medications   Medication Sig Start Date End Date Taking? Authorizing Provider  meloxicam (MOBIC) 7.5 MG tablet Take 2 tablets  (15 mg total) by mouth daily. 05/06/15   Antony Madura, PA-C  methocarbamol (ROBAXIN) 500 MG tablet Take 1 tablet (500 mg total) by mouth 2 (two) times daily. 05/06/15   Antony Madura, PA-C  metoCLOPramide (REGLAN) 10 MG tablet Take 1 tablet (10 mg total) by mouth every 6 (six) hours as needed for nausea (or headache). 02/03/15   Dione Booze, MD  traMADol (ULTRAM) 50 MG tablet Take 1 tablet (50 mg total) by mouth every 6 (six) hours as needed. 05/06/15   Antony Madura, PA-C   BP 114/62 mmHg  Pulse 69  Temp(Src) 98.2 F (36.8 C) (Oral)  Resp 18  SpO2 99%   Physical Exam  Constitutional: She is oriented to person, place, and time. She appears well-developed and well-nourished. No distress.  Nontoxic/nonseptic appearing  HENT:  Head: Normocephalic and atraumatic.  Eyes: Conjunctivae and EOM are normal. No scleral icterus.  Neck: Normal range of motion.  Cardiovascular: Normal rate, regular rhythm and intact distal pulses.   Pulmonary/Chest: Effort normal. No respiratory distress.  Musculoskeletal: Normal range of motion. She exhibits tenderness.  Tenderness to palpation to lumbar paraspinal muscles bilaterally. No appreciable spasm. No bony deformities, step-offs, or crepitus to the lumbar midline.  Neurological: She is alert and oriented to person, place, and time. She exhibits normal muscle tone. Coordination normal.  Sensation to light touch intact in bilateral lower extremities. Patient ambulatory with steady gait.  Skin: Skin is warm and dry. No rash noted. She is not diaphoretic. No erythema. No pallor.  Psychiatric: She has a normal mood and affect. Her behavior is normal.  Nursing note and vitals reviewed.   ED Course  Procedures (including critical care time) Labs Review Labs Reviewed  URINALYSIS, ROUTINE W REFLEX MICROSCOPIC (NOT AT [facility])  PREGNANCY, URINE    Imaging Review No results found.   EKG Interpretation None      MDM   Final diagnoses:  Low back strain, initial  encounter    Patient with back pain. She is neurovascularly intact on exam. Patient can walk but states is painful. No loss of bowel or bladder control. No concern for cauda equina. No fevers or weight loss. No reported IVDU. Patient tx in ED with pain medication and Toradol as well as oral Robaxin. Pain has improved since receiving these medications. Suspect low back strain. RICE protocol and pain medicine indicated and discussed with patient for outpatient management. Primary careful advised and return precautions given. Patient agreeable to plan with no unaddressed concerns. Patient discharged in good condition; VSS.   Filed Vitals:   05/06/15 0149 05/06/15 0419  BP: 110/72 114/62  Pulse: 89 69  Temp: 98.1 F (36.7 C) 98.2 F (36.8 C)  TempSrc: Oral Oral  Resp: 16 18  SpO2: 98% 99%       Antony Madura, PA-C 05/06/15 8335  Loren Racer, MD 05/06/15 [phone]

## 2015-05-06 NOTE — ED Notes (Addendum)
Pt with friend. Pt going home with another pt who will be driving. Pt being wheeled to other pts room in wheelchair, pt able to ambulate without assistance to wheelchair

## 2015-05-06 NOTE — ED Notes (Addendum)
Pt. reports low back pain onset today , denies injury/fall or strenuous activity  , no dysuria or hematuria , pain increases with movement and changing positions .

## 2015-08-12 ENCOUNTER — Encounter (HOSPITAL_COMMUNITY): Payer: Self-pay

## 2015-08-12 ENCOUNTER — Emergency Department (HOSPITAL_COMMUNITY)
Admission: EM | Admit: 2015-08-12 | Discharge: 2015-08-13 | Disposition: A | Discharge status: Home / Self Care | Payer: Self-pay | Attending: Emergency Medicine | Admitting: Emergency Medicine

## 2015-08-12 DIAGNOSIS — R51 Headache: Secondary | ICD-10-CM | POA: Insufficient documentation

## 2015-08-12 DIAGNOSIS — J0110 Acute frontal sinusitis, unspecified: Secondary | ICD-10-CM | POA: Insufficient documentation

## 2015-08-12 DIAGNOSIS — R0981 Nasal congestion: Secondary | ICD-10-CM | POA: Insufficient documentation

## 2015-08-12 DIAGNOSIS — Z720 Tobacco use: Secondary | ICD-10-CM | POA: Insufficient documentation

## 2015-08-12 DIAGNOSIS — H81399 Other peripheral vertigo, unspecified ear: Secondary | ICD-10-CM | POA: Insufficient documentation

## 2015-08-12 DIAGNOSIS — R112 Nausea with vomiting, unspecified: Secondary | ICD-10-CM | POA: Insufficient documentation

## 2015-08-12 DIAGNOSIS — J3489 Other specified disorders of nose and nasal sinuses: Secondary | ICD-10-CM | POA: Insufficient documentation

## 2015-08-12 DIAGNOSIS — Z3202 Encounter for pregnancy test, result negative: Secondary | ICD-10-CM | POA: Insufficient documentation

## 2015-08-12 LAB — CBG MONITORING, ED: GLUCOSE-CAPILLARY: 120 mg/dL — AB (ref 65–99)

## 2015-08-12 LAB — URINALYSIS, ROUTINE W REFLEX MICROSCOPIC
BILIRUBIN URINE: NEGATIVE
Glucose, UA: NEGATIVE mg/dL
Hgb urine dipstick: NEGATIVE
KETONES UR: NEGATIVE mg/dL
LEUKOCYTES UA: NEGATIVE
NITRITE: NEGATIVE
PH: 6.5 (ref 5.0–8.0)
PROTEIN: NEGATIVE mg/dL
Specific Gravity, Urine: 1.024 (ref 1.005–1.030)
UROBILINOGEN UA: 0.2 mg/dL (ref 0.0–1.0)

## 2015-08-12 LAB — BASIC METABOLIC PANEL
Anion gap: 8 (ref 5–15)
CALCIUM: 9.1 mg/dL (ref 8.9–10.3)
CHLORIDE: 99 mmol/L — AB (ref 101–111)
CO2: 28 mmol/L (ref 22–32)
CREATININE: 0.78 mg/dL (ref 0.44–1.00)
Glucose, Bld: 157 mg/dL — ABNORMAL HIGH (ref 65–99)
Potassium: 3.2 mmol/L — ABNORMAL LOW (ref 3.5–5.1)
SODIUM: 135 mmol/L (ref 135–145)

## 2015-08-12 LAB — CBC
HCT: 37.1 % (ref 36.0–46.0)
Hemoglobin: 12.4 g/dL (ref 12.0–15.0)
MCH: 29.3 pg (ref 26.0–34.0)
MCHC: 33.4 g/dL (ref 30.0–36.0)
MCV: 87.7 fL (ref 78.0–100.0)
PLATELETS: 253 10*3/uL (ref 150–400)
RBC: 4.23 MIL/uL (ref 3.87–5.11)
RDW: 12.8 % (ref 11.5–15.5)
WBC: 7 10*3/uL (ref 4.0–10.5)

## 2015-08-12 LAB — I-STAT BETA HCG BLOOD, ED (MC, WL, AP ONLY): I-stat hCG, quantitative: <5 m[IU]/mL (ref <5)

## 2015-08-12 MED ORDER — OXYMETAZOLINE HCL 0.05 % NA SOLN
1.0000 | Freq: Once | NASAL | Status: AC
  Administered 2015-08-12: 1 via NASAL
  Filled 2015-08-12: qty 15

## 2015-08-12 MED ORDER — IBUPROFEN 400 MG PO TABS
600.0000 mg | ORAL_TABLET | Freq: Once | ORAL | Status: AC
  Administered 2015-08-12: 600 mg via ORAL
  Filled 2015-08-12 (×2): qty 1

## 2015-08-12 MED ORDER — LORATADINE 10 MG PO TABS
10.0000 mg | ORAL_TABLET | Freq: Once | ORAL | Status: AC
  Administered 2015-08-12: 10 mg via ORAL
  Filled 2015-08-12: qty 1

## 2015-08-12 MED ORDER — MECLIZINE HCL 25 MG PO TABS
25.0000 mg | ORAL_TABLET | Freq: Once | ORAL | Status: AC
  Administered 2015-08-12: 25 mg via ORAL
  Filled 2015-08-12: qty 1

## 2015-08-12 NOTE — ED Provider Notes (Signed)
CSN: [id number]   Arrival date & time 08/12/15 1952  History  This chart was scribed for Loren Racer, MD by Bethel Born, ED Scribe. This patient was seen in room B17C/B17C and the patient's care was started at 11:08 PM.  Chief Complaint  Patient presents with  . Dizziness    HPI The history is provided by the patient. No language interpreter was used.   Stephanie Reid is a 24 y.o. female with history of seasonal allergies who presents to the Emergency Department complaining of an episode of room-spinning dizziness with sudden onset around 6:30 PM at work. At the initial onset of dizziness the pt had just sat down. The sensation has since resolved. Associated symptoms include nausea ,1 episode of emesis,and a frontal headache with movement. She also notes a sensation of pressure at her rectum as if she had to have a bowel movement and shooting foot pain with the episode. This is since resolved. She has had rhinorrhea that she associates with her sinuses. She took no medication PTA. Pt denies recent fever and weakness or numbness.  History reviewed. No pertinent past medical history.  History reviewed. No pertinent past surgical history.  Family History  Problem Relation Age of Onset  . Hypertension Mother   . Heart disease Mother   . Stroke Mother   . Diabetes Maternal Grandmother     Social History  Substance Use Topics  . Smoking status: Current Every Day Smoker -- 0.10 packs/day    Types: Cigarettes  . Smokeless tobacco: Never Used  . Alcohol Use: Yes     Comment: occasion     Review of Systems  Constitutional: Negative for fever and chills.  HENT: Positive for congestion, rhinorrhea and sinus pressure. Negative for ear pain, hearing loss and sore throat.   Eyes: Negative for photophobia and visual disturbance.  Respiratory: Negative for cough and shortness of breath.   Cardiovascular: Negative for chest pain, palpitations and leg swelling.  Gastrointestinal:  Positive for nausea and vomiting. Negative for abdominal pain, diarrhea and constipation.  Musculoskeletal: Negative for myalgias, back pain, neck pain and neck stiffness.  Skin: Negative for rash and wound.  Neurological: Positive for dizziness and headaches. Negative for syncope, weakness, light-headedness and numbness.  All other systems reviewed and are negative.   Home Medications   Prior to Admission medications   Medication Sig Start Date End Date Taking? Authorizing Provider  ibuprofen (ADVIL,MOTRIN) 600 MG tablet Take 1 tablet (600 mg total) by mouth every 6 (six) hours as needed. 08/13/15   Loren Racer, MD  loratadine (CLARITIN) 10 MG tablet Take 1 tablet (10 mg total) by mouth daily. 08/13/15   Loren Racer, MD  meclizine (ANTIVERT) 25 MG tablet Take 1 tablet (25 mg total) by mouth 3 (three) times daily as needed for dizziness. 08/13/15   Loren Racer, MD  mometasone (NASONEX) 50 MCG/ACT nasal spray Place 2 sprays into the nose daily. 08/13/15   Loren Racer, MD    Allergies  Apple; Banana; Orange fruit; Lactase-lactobacillus; Spinach; and Vicodin  Triage Vitals: BP 125/68 mmHg  Pulse 76  Temp(Src) 98.1 F (36.7 C) (Oral)  Resp 16  SpO2 98%  LMP   Physical Exam  Constitutional: She is oriented to person, place, and time. She appears well-developed and well-nourished. No distress.  HENT:  Head: Normocephalic and atraumatic.  Mouth/Throat: Oropharynx is clear and moist.  Bilateral frontal sinus tenderness with percussion. Bilateral TMs normal. Bilateral nasal mucosal edema.  Eyes: EOM are normal.  Pupils are equal, round, and reactive to light.  Patient with fatigable rotatory nystagmus  Neck: Normal range of motion. Neck supple.  No meningismus  Cardiovascular: Normal rate and regular rhythm.   Pulmonary/Chest: Effort normal and breath sounds normal. No respiratory distress. She has no wheezes. She has no rales.  Abdominal: Soft. Bowel sounds are normal. She  exhibits no distension and no mass. There is no tenderness. There is no rebound and no guarding.  Musculoskeletal: Normal range of motion. She exhibits no edema or tenderness.  No lower extremity swelling or pain.  Neurological: She is alert and oriented to person, place, and time.  Patient is alert and oriented x3 with clear, goal oriented speech. Patient has 5/5 motor in all extremities. Sensation is intact to light touch. Bilateral finger-to-nose is normal with no signs of dysmetria. Patient has a normal gait and walks without assistance.  Skin: Skin is warm and dry. No rash noted. No erythema.  Psychiatric: She has a normal mood and affect. Her behavior is normal.  Nursing note and vitals reviewed.   ED Course  Procedures   DIAGNOSTIC STUDIES: Oxygen Saturation is 98% on RA, normal by my interpretation.    COORDINATION OF CARE: 11:11 PM Discussed treatment plan which includes lab work, EKG, ibuprofen, meclizine, Claritin, and Afrin with pt at bedside and pt agreed to plan.  12:58 AM I re-evaluated the patient and she is feeling better. Pt is in agreement with the plan for discharge.    Labs Reviewed  BASIC METABOLIC PANEL - Abnormal; Notable for the following:    Potassium 3.2 (*)    Chloride 99 (*)    Glucose, Bld 157 (*)    BUN <5 (*)    All other components within normal limits  CBG MONITORING, ED - Abnormal; Notable for the following:    Glucose-Capillary 120 (*)    All other components within normal limits  CBC  URINALYSIS, ROUTINE W REFLEX MICROSCOPIC (NOT AT [facility])  I-STAT BETA HCG BLOOD, ED ([facility], [facility], [facility] ONLY)    Imaging Review No results found.  EKG Interpretation  Date/Time:  Saturday August 12 2015 20:15:38 EDT Ventricular Rate:  78 PR Interval:  172 QRS Duration: 82 QT Interval:  382 QTC Calculation: 435 R Axis:   53 Text Interpretation:  Normal sinus rhythm with sinus arrhythmia Nonspecific T wave abnormality Abnormal ECG Confirmed by Ranae Palms  MD,  Kaytlen Lightsey ([id number]) on 08/12/2015 11:05:22 PM    MDM   Final diagnoses:  Acute frontal sinusitis, recurrence not specified  Peripheral vertigo, unspecified laterality    I personally performed the services described in this documentation, which was scribed in my presence. The recorded information has been reviewed and is accurate.  Patient with acute onset of vertiginous symptoms. Associated with nasal congestion and sinus headache. Normal neurologic exam. We'll treat symptomatically and reevaluate.   Patient is feeling much better. Continues to have normal neurologic exam. Return precautions given.  Loren Racer, MD 08/13/15 0111

## 2015-08-12 NOTE — ED Notes (Signed)
Pt ambulated to the bathroom with ease 

## 2015-08-12 NOTE — ED Notes (Signed)
Onset 1 hour PTA pt was at work standing when she felt dizzy, felt like she was going to pass out, vomited x 1, pressure in rectum and had shooting pains in bilateral feet.  Last BM 2-3 days ago, usually has BM q several days.  Pt feels slightly dizzy, nauseated and rectal pressure.

## 2015-08-13 MED ORDER — LORATADINE 10 MG PO TABS: 10.0000 mg | ORAL_TABLET | Freq: Every day | ORAL | Status: DC

## 2015-08-13 MED ORDER — MECLIZINE HCL 25 MG PO TABS: 25.0000 mg | ORAL_TABLET | Freq: Three times a day (TID) | ORAL | Status: DC | PRN

## 2015-08-13 MED ORDER — IBUPROFEN 600 MG PO TABS: 600.0000 mg | ORAL_TABLET | Freq: Four times a day (QID) | ORAL | Status: DC | PRN

## 2015-08-13 MED ORDER — MOMETASONE FUROATE 50 MCG/ACT NA SUSP: 2.0000 | Freq: Every day | NASAL | Status: DC

## 2015-08-13 NOTE — Discharge Instructions (Signed)
Vertigo °Vertigo means you feel like you or your surroundings are moving when they are not. Vertigo can be dangerous if it occurs when you are at work, driving, or performing difficult activities.  °CAUSES  °Vertigo occurs when there is a conflict of signals sent to your brain from the visual and sensory systems in your body. There are many different causes of vertigo, including: °· Infections, especially in the inner ear. °· A bad reaction to a drug or misuse of alcohol and medicines. °· Withdrawal from drugs or alcohol. °· Rapidly changing positions, such as lying down or rolling over in bed. °· A migraine headache. °· Decreased blood flow to the brain. °· Increased pressure in the brain from a head injury, infection, tumor, or bleeding. °SYMPTOMS  °You may feel as though the world is spinning around or you are falling to the ground. Because your balance is upset, vertigo can cause nausea and vomiting. You may have involuntary eye movements (nystagmus). °DIAGNOSIS  °Vertigo is usually diagnosed by physical exam. If the cause of your vertigo is unknown, your caregiver may perform imaging tests, such as an MRI scan (magnetic resonance imaging). °TREATMENT  °Most cases of vertigo resolve on their own, without treatment. Depending on the cause, your caregiver may prescribe certain medicines. If your vertigo is related to body position issues, your caregiver may recommend movements or procedures to correct the problem. In rare cases, if your vertigo is caused by certain inner ear problems, you may need surgery. °HOME CARE INSTRUCTIONS  °· Follow your caregiver's instructions. °· Avoid driving. °· Avoid operating heavy machinery. °· Avoid performing any tasks that would be dangerous to you or others during a vertigo episode. °· Tell your caregiver if you notice that certain medicines seem to be causing your vertigo. Some of the medicines used to treat vertigo episodes can actually make them worse in some people. °SEEK  IMMEDIATE MEDICAL CARE IF:  °· Your medicines do not relieve your vertigo or are making it worse. °· You develop problems with talking, walking, weakness, or using your arms, hands, or legs. °· You develop severe headaches. °· Your nausea or vomiting continues or gets worse. °· You develop visual changes. °· A family member notices behavioral changes. °· Your condition gets worse. °MAKE SURE YOU: °· Understand these instructions. °· Will watch your condition. °· Will get help right away if you are not doing well or get worse. °Document Released: 08/21/2005 Document Revised: 02/03/2012 Document Reviewed: 05/30/2011 °ExitCare® Patient Information ©2015 [employer]. This information is not intended to replace advice given to you by your health care provider. Make sure you discuss any questions you have with your health care provider. °Sinusitis °Sinusitis is redness, soreness, and inflammation of the paranasal sinuses. Paranasal sinuses are air pockets within the bones of your face (beneath the eyes, the middle of the forehead, or above the eyes). In healthy paranasal sinuses, mucus is able to drain out, and air is able to circulate through them by way of your nose. However, when your paranasal sinuses are inflamed, mucus and air can become trapped. This can allow bacteria and other germs to grow and cause infection. °Sinusitis can develop quickly and last only a short time (acute) or continue over a long period (chronic). Sinusitis that lasts for more than 12 weeks is considered chronic.  °CAUSES  °Causes of sinusitis include: °· Allergies. °· Structural abnormalities, such as displacement of the cartilage that separates your nostrils (deviated septum), which can decrease the   air flow through your nose and sinuses and affect sinus drainage. °· Functional abnormalities, such as when the small hairs (cilia) that line your sinuses and help remove mucus do not work properly or are not present. °SIGNS AND SYMPTOMS    °Symptoms of acute and chronic sinusitis are the same. The primary symptoms are pain and pressure around the affected sinuses. Other symptoms include: °· Upper toothache. °· Earache. °· Headache. °· Bad breath. °· Decreased sense of smell and taste. °· A cough, which worsens when you are lying flat. °· Fatigue. °· Fever. °· Thick drainage from your nose, which often is green and may contain pus (purulent). °· Swelling and warmth over the affected sinuses. °DIAGNOSIS  °Your health care provider will perform a physical exam. During the exam, your health care provider may: °· Look in your nose for signs of abnormal growths in your nostrils (nasal polyps). °· Tap over the affected sinus to check for signs of infection. °· View the inside of your sinuses (endoscopy) using an imaging device that has a light attached (endoscope). °If your health care provider suspects that you have chronic sinusitis, one or more of the following tests may be recommended: °· Allergy tests. °· Nasal culture. A sample of mucus is taken from your nose, sent to a lab, and screened for bacteria. °· Nasal cytology. A sample of mucus is taken from your nose and examined by your health care provider to determine if your sinusitis is related to an allergy. °TREATMENT  °Most cases of acute sinusitis are related to a viral infection and will resolve on their own within 10 days. Sometimes medicines are prescribed to help relieve symptoms (pain medicine, decongestants, nasal steroid sprays, or saline sprays).  °However, for sinusitis related to a bacterial infection, your health care provider will prescribe antibiotic medicines. These are medicines that will help kill the bacteria causing the infection.  °Rarely, sinusitis is caused by a fungal infection. In theses cases, your health care provider will prescribe antifungal medicine. °For some cases of chronic sinusitis, surgery is needed. Generally, these are cases in which sinusitis recurs more than 3  times per year, despite other treatments. °HOME CARE INSTRUCTIONS  °· Drink plenty of water. Water helps thin the mucus so your sinuses can drain more easily. °· Use a humidifier. °· Inhale steam 3 to 4 times a day (for example, sit in the bathroom with the shower running). °· Apply a warm, moist washcloth to your face 3 to 4 times a day, or as directed by your health care provider. °· Use saline nasal sprays to help moisten and clean your sinuses. °· Take medicines only as directed by your health care provider. °· If you were prescribed either an antibiotic or antifungal medicine, finish it all even if you start to feel better. °SEEK IMMEDIATE MEDICAL CARE IF: °· You have increasing pain or severe headaches. °· You have nausea, vomiting, or drowsiness. °· You have swelling around your face. °· You have vision problems. °· You have a stiff neck. °· You have difficulty breathing. °MAKE SURE YOU:  °· Understand these instructions. °· Will watch your condition. °· Will get help right away if you are not doing well or get worse. °Document Released: 11/11/2005 Document Revised: 03/28/2014 Document Reviewed: 11/26/2011 °ExitCare® Patient Information ©2015 [employer]. This information is not intended to replace advice given to you by your health care provider. Make sure you discuss any questions you have with your health care provider. ° °

## 2015-12-10 ENCOUNTER — Emergency Department (HOSPITAL_COMMUNITY): Payer: Self-pay

## 2015-12-10 ENCOUNTER — Encounter (HOSPITAL_COMMUNITY): Payer: Self-pay

## 2015-12-10 DIAGNOSIS — Z792 Long term (current) use of antibiotics: Secondary | ICD-10-CM | POA: Insufficient documentation

## 2015-12-10 DIAGNOSIS — N760 Acute vaginitis: Secondary | ICD-10-CM | POA: Insufficient documentation

## 2015-12-10 DIAGNOSIS — J020 Streptococcal pharyngitis: Secondary | ICD-10-CM | POA: Insufficient documentation

## 2015-12-10 DIAGNOSIS — F17210 Nicotine dependence, cigarettes, uncomplicated: Secondary | ICD-10-CM | POA: Insufficient documentation

## 2015-12-10 DIAGNOSIS — H9203 Otalgia, bilateral: Secondary | ICD-10-CM | POA: Insufficient documentation

## 2015-12-10 DIAGNOSIS — Z3202 Encounter for pregnancy test, result negative: Secondary | ICD-10-CM | POA: Insufficient documentation

## 2015-12-10 NOTE — ED Notes (Signed)
Pt states she has been having white vaginal discharge for a week now that has an odor to it. The past 3 days has had a cough, general body aches, earaches.

## 2015-12-11 ENCOUNTER — Emergency Department (HOSPITAL_COMMUNITY)
Admission: EM | Admit: 2015-12-11 | Discharge: 2015-12-11 | Disposition: A | Discharge status: Home / Self Care | Payer: Self-pay | Attending: Emergency Medicine | Admitting: Emergency Medicine

## 2015-12-11 DIAGNOSIS — J020 Streptococcal pharyngitis: Secondary | ICD-10-CM

## 2015-12-11 DIAGNOSIS — N760 Acute vaginitis: Secondary | ICD-10-CM

## 2015-12-11 DIAGNOSIS — B9689 Other specified bacterial agents as the cause of diseases classified elsewhere: Secondary | ICD-10-CM

## 2015-12-11 LAB — WET PREP, GENITAL
SPERM: NONE SEEN
TRICH WET PREP: NONE SEEN
Yeast Wet Prep HPF POC: NONE SEEN

## 2015-12-11 LAB — RAPID STREP SCREEN (MED CTR MEBANE ONLY): Streptococcus, Group A Screen (Direct): POSITIVE — AB

## 2015-12-11 LAB — I-STAT BETA HCG BLOOD, ED (MC, WL, AP ONLY): I-stat hCG, quantitative: <5 m[IU]/mL (ref <5)

## 2015-12-11 LAB — GC/CHLAMYDIA PROBE AMP (~~LOC~~) NOT AT ARMC
CHLAMYDIA, DNA PROBE: NEGATIVE
Neisseria Gonorrhea: NEGATIVE

## 2015-12-11 MED ORDER — ONDANSETRON 4 MG PO TBDP
4.0000 mg | ORAL_TABLET | Freq: Once | ORAL | Status: AC
  Administered 2015-12-11: 4 mg via ORAL
  Filled 2015-12-11: qty 1

## 2015-12-11 MED ORDER — TRAMADOL HCL 50 MG PO TABS: 50.0000 mg | ORAL_TABLET | Freq: Four times a day (QID) | ORAL | Status: DC | PRN

## 2015-12-11 MED ORDER — KETOROLAC TROMETHAMINE 30 MG/ML IJ SOLN: 30.0000 mg | Freq: Once | INTRAMUSCULAR | Status: DC

## 2015-12-11 MED ORDER — TRAMADOL HCL 50 MG PO TABS
50.0000 mg | ORAL_TABLET | Freq: Once | ORAL | Status: AC
  Administered 2015-12-11: 50 mg via ORAL
  Filled 2015-12-11: qty 1

## 2015-12-11 MED ORDER — PENICILLIN G BENZATHINE 1200000 UNIT/2ML IM SUSP
1.2000 10*6.[IU] | Freq: Once | INTRAMUSCULAR | Status: AC
  Administered 2015-12-11: 1.2 10*6.[IU] via INTRAMUSCULAR
  Filled 2015-12-11: qty 2

## 2015-12-11 MED ORDER — CEFTRIAXONE SODIUM 250 MG IJ SOLR: 250.0000 mg | Freq: Once | INTRAMUSCULAR | Status: DC

## 2015-12-11 MED ORDER — DEXAMETHASONE SODIUM PHOSPHATE 10 MG/ML IJ SOLN
10.0000 mg | Freq: Once | INTRAMUSCULAR | Status: AC
  Administered 2015-12-11: 10 mg via INTRAMUSCULAR
  Filled 2015-12-11: qty 1

## 2015-12-11 MED ORDER — METRONIDAZOLE 500 MG PO TABS: 500.0000 mg | ORAL_TABLET | Freq: Two times a day (BID) | ORAL | Status: DC

## 2015-12-11 MED ORDER — AZITHROMYCIN 250 MG PO TABS
1000.0000 mg | ORAL_TABLET | Freq: Once | ORAL | Status: AC
  Administered 2015-12-11: 1000 mg via ORAL
  Filled 2015-12-11: qty 4

## 2015-12-11 NOTE — ED Notes (Signed)
Rechecked BP  

## 2015-12-11 NOTE — ED Provider Notes (Signed)
CSN: [id number]     Arrival date & time 12/10/15  2147 History   First MD Initiated Contact with Patient 12/11/15 0110     Chief Complaint  Patient presents with  . Vaginal Discharge  . Cough  . Generalized Body Aches     (Consider location/radiation/quality/duration/timing/severity/associated sxs/prior Treatment) HPI   She presents to the emergency department with complaints of sore throat, bilateral ear pain, low-grade fevers, generalized body aches, cough, white and sometimes clear vaginal discharge. She says that the symptoms started a week ago and have been progressively getting worse. She states that the body aches are very uncomfortable. She denies having any nausea, vomiting, diarrhea, headaches, neck pain, weakness or fatigue.  History reviewed. No pertinent past medical history. History reviewed. No pertinent past surgical history. Family History  Problem Relation Age of Onset  . Hypertension Mother   . Heart disease Mother   . Stroke Mother   . Diabetes Maternal Grandmother    Social History  Substance Use Topics  . Smoking status: Current Every Day Smoker -- 0.10 packs/day    Types: Cigarettes  . Smokeless tobacco: Never Used  . Alcohol Use: Yes     Comment: occasion   OB History    No data available     Review of Systems   Review of Systems All other systems negative except as documented in the HPI. All pertinent positives and negatives as reviewed in the HPI.  Allergies  Apple; Banana; Orange fruit; Lactase-lactobacillus; Spinach; and Vicodin  Home Medications   Prior to Admission medications   Medication Sig Start Date End Date Taking? Authorizing Provider  cephALEXin (KEFLEX) 500 MG capsule Take 500 mg by mouth 4 (four) times daily.   Yes Historical Provider, MD  ibuprofen (ADVIL,MOTRIN) 600 MG tablet Take 1 tablet (600 mg total) by mouth every 6 (six) hours as needed. Patient not taking: Reported on 12/11/2015 08/13/15   Loren Racer, MD   loratadine (CLARITIN) 10 MG tablet Take 1 tablet (10 mg total) by mouth daily. Patient not taking: Reported on 12/11/2015 08/13/15   Loren Racer, MD  meclizine (ANTIVERT) 25 MG tablet Take 1 tablet (25 mg total) by mouth 3 (three) times daily as needed for dizziness. Patient not taking: Reported on 12/11/2015 08/13/15   Loren Racer, MD  metroNIDAZOLE (FLAGYL) 500 MG tablet Take 1 tablet (500 mg total) by mouth 2 (two) times daily. 12/11/15   Jace Fermin Neva Seat, PA-C  mometasone (NASONEX) 50 MCG/ACT nasal spray Place 2 sprays into the nose daily. Patient not taking: Reported on 12/11/2015 08/13/15   Loren Racer, MD  traMADol (ULTRAM) 50 MG tablet Take 1 tablet (50 mg total) by mouth every 6 (six) hours as needed. 12/11/15   Royden Bulman Neva Seat, PA-C   BP 112/64 mmHg  Pulse 84  Temp(Src) 99.6 F (37.6 C) (Oral)  Resp 18  SpO2 91% Physical Exam  Constitutional: She is oriented to person, place, and time. She appears well-developed and well-nourished. No distress.  HENT:  Head: Normocephalic and atraumatic.  Right Ear: Tympanic membrane, external ear and ear canal normal.  Left Ear: Tympanic membrane, external ear and ear canal normal.  Nose: Nose normal. No rhinorrhea. Right sinus exhibits no maxillary sinus tenderness and no frontal sinus tenderness. Left sinus exhibits no maxillary sinus tenderness and no frontal sinus tenderness.  Mouth/Throat: Uvula is midline and mucous membranes are normal. No trismus in the jaw. Normal dentition. No dental abscesses or uvula swelling. Oropharyngeal exudate and posterior oropharyngeal edema present. No  posterior oropharyngeal erythema or tonsillar abscesses.  No submental edema, tongue not elevated, no trismus. No impending airway obstruction; Pt able to speak full sentences, swallow intact, no drooling, stridor, or tonsillar/uvula displacement. No palatal petechia  Eyes: Conjunctivae are normal.  Neck: Trachea normal, normal range of motion and full passive  range of motion without pain. Neck supple. No rigidity. Normal range of motion present. No Brudzinski's sign noted.  Flexion and extension of neck without pain or difficulty. Able to breath without difficulty in extension.  Cardiovascular: Normal rate and regular rhythm.   Pulmonary/Chest: Effort normal and breath sounds normal. No stridor. No respiratory distress. She has no wheezes.  Abdominal: Soft. There is no tenderness.  No obvious evidence of splenomegaly. Non ttp.   Genitourinary: Uterus normal. Cervix exhibits no motion tenderness, no discharge and no friability. Right adnexum displays no mass and no tenderness. Left adnexum displays no mass and no tenderness. No tenderness in the vagina. No signs of injury around the vagina. No vaginal discharge found.  Small amount of white discharge within vaginal vault.  Musculoskeletal: Normal range of motion.  Lymphadenopathy:       Head (right side): No preauricular and no posterior auricular adenopathy present.       Head (left side): No preauricular and no posterior auricular adenopathy present.    She has cervical adenopathy.  Neurological: She is alert and oriented to person, place, and time.  Skin: Skin is warm and dry. No rash noted. She is not diaphoretic.  Psychiatric: She has a normal mood and affect.  Nursing note and vitals reviewed.   ED Course  Procedures (including critical care time) Labs Review Labs Reviewed  WET PREP, GENITAL - Abnormal; Notable for the following:    Clue Cells Wet Prep HPF POC PRESENT (*)    WBC, Wet Prep HPF POC MODERATE (*)    All other components within normal limits  RAPID STREP SCREEN (NOT AT [facility]) - Abnormal; Notable for the following:    Streptococcus, Group A Screen (Direct) POSITIVE (*)    All other components within normal limits  I-STAT BETA HCG BLOOD, ED ([facility], [facility], [facility] ONLY)  GC/CHLAMYDIA PROBE AMP ([place]) NOT AT [facility]    Imaging Review Dg Chest 2 View  12/10/2015  CLINICAL DATA:   Acute onset of vaginal discharge, cough, generalized body aches and earaches. Initial encounter. EXAM: CHEST  2 VIEW COMPARISON:  Chest radiograph performed 05/07/2014 FINDINGS: The lungs are well-aerated and clear. There is no evidence of focal opacification, pleural effusion or pneumothorax. The heart is normal in size; the mediastinal contour is within normal limits. No acute osseous abnormalities are seen. IMPRESSION: No acute cardiopulmonary process seen. Electronically Signed   By: Roanna RaiderJeffery  Chang M.D.   On: 12/10/2015 22:59   I have personally reviewed and evaluated these images and lab results as part of my medical decision-making.   EKG Interpretation None      MDM   Final diagnoses:  Strep throat  Bacterial vaginosis    Medications  azithromycin (ZITHROMAX) tablet 1,000 mg (not administered)  traMADol (ULTRAM) tablet 50 mg (not administered)  ondansetron (ZOFRAN-ODT) disintegrating tablet 4 mg (4 mg Oral Given 12/11/15 0301)  dexamethasone (DECADRON) injection 10 mg (10 mg Intramuscular Given 12/11/15 0302)  penicillin g benzathine (BICILLIN LA) 1200000 UNIT/2ML injection 1.2 Million Units (1.2 Million Units Intramuscular Given 12/11/15 0303)    Pt has strep pharyngitis as well as bacterial vaginosis and positive WBC. Treated with Pen G shot  and Azithromycin. GC cultures pending, pt did not want another injection as she has had two already and has low suspicion for STD. Negative Chest xray  Pt rapid strep test positive. Pt is tolerating secretions. Presentation not concerning for peritonsillar abscess or spread of infection to deep spaces of the throat; patent airway. Marland Kitchen  Specific return precautions discussed. Recommended PCP follow up.  Specific return precautions discussed.  Recommended PCP follow up. Pt appears safe for discharge.        Marlon Pel, PA-C 12/11/15 1610  April Palumbo, MD 12/11/15 [phone]

## 2015-12-11 NOTE — Discharge Instructions (Signed)
Strep Throat °Strep throat is a bacterial infection of the throat. Your health care provider may call the infection tonsillitis or pharyngitis, depending on whether there is swelling in the tonsils or at the back of the throat. Strep throat is most common during the cold months of the year in children who are 5-27 years of age, but it can happen during any season in people of any age. This infection is spread from person to person (contagious) through coughing, sneezing, or close contact. °CAUSES °Strep throat is caused by the bacteria called Streptococcus pyogenes. °RISK FACTORS °This condition is more likely to develop in: °· People who spend time in crowded places where the infection can spread easily. °· People who have close contact with someone who has strep throat. °SYMPTOMS °Symptoms of this condition include: °· Fever or chills.   °· Redness, swelling, or pain in the tonsils or throat. °· Pain or difficulty when swallowing. °· White or yellow spots on the tonsils or throat. °· Swollen, tender glands in the neck or under the jaw. °· Red rash all over the body (rare). °DIAGNOSIS °This condition is diagnosed by performing a rapid strep test or by taking a swab of your throat (throat culture test). Results from a rapid strep test are usually ready in a few minutes, but throat culture test results are available after one or two days. °TREATMENT °This condition is treated with antibiotic medicine. °HOME CARE INSTRUCTIONS °Medicines °· Take over-the-counter and prescription medicines only as told by your health care provider. °· Take your antibiotic as told by your health care provider. Do not stop taking the antibiotic even if you start to feel better. °· Have family members who also have a sore throat or fever tested for strep throat. They may need antibiotics if they have the strep infection. °Eating and Drinking °· Do not share food, drinking cups, or personal items that could cause the infection to spread to  other people. °· If swallowing is difficult, try eating soft foods until your sore throat feels better. °· Drink enough fluid to keep your urine clear or pale yellow. °General Instructions °· Gargle with a salt-water mixture 3-4 times per day or as needed. To make a salt-water mixture, completely dissolve ½-1 tsp of salt in 1 cup of warm water. °· Make sure that all household members wash their hands well. °· Get plenty of rest. °· Stay home from school or work until you have been taking antibiotics for 24 hours. °· Keep all follow-up visits as told by your health care provider. This is important. °SEEK MEDICAL CARE IF: °· The glands in your neck continue to get bigger. °· You develop a rash, cough, or earache. °· You cough up a thick liquid that is green, yellow-brown, or bloody. °· You have pain or discomfort that does not get better with medicine. °· Your problems seem to be getting worse rather than better. °· You have a fever. °SEEK IMMEDIATE MEDICAL CARE IF: °· You have new symptoms, such as vomiting, severe headache, stiff or painful neck, chest pain, or shortness of breath. °· You have severe throat pain, drooling, or changes in your voice. °· You have swelling of the neck, or the skin on the neck becomes red and tender. °· You have signs of dehydration, such as fatigue, dry mouth, and decreased urination. °· You become increasingly sleepy, or you cannot wake up completely. °· Your joints become red or painful. °  °This information is not intended to replace   advice given to you by your health care provider. Make sure you discuss any questions you have with your health care provider.   Document Released: 11/08/2000 Document Revised: 08/02/2015 Document Reviewed: 03/06/2015 Elsevier Interactive Patient Education 2016 [employer]  Bacterial Vaginosis Bacterial vaginosis is a vaginal infection that occurs when the normal balance of bacteria in the vagina is disrupted. It results from an overgrowth of  certain bacteria. This is the most common vaginal infection in women of childbearing age. Treatment is important to prevent complications, especially in pregnant women, as it can cause a premature delivery. CAUSES  Bacterial vaginosis is caused by an increase in harmful bacteria that are normally present in smaller amounts in the vagina. Several different kinds of bacteria can cause bacterial vaginosis. However, the reason that the condition develops is not fully understood. RISK FACTORS Certain activities or behaviors can put you at an increased risk of developing bacterial vaginosis, including:  Having a new sex partner or multiple sex partners.  Douching.  Using an intrauterine device (IUD) for contraception. Women do not get bacterial vaginosis from toilet seats, bedding, swimming pools, or contact with objects around them. SIGNS AND SYMPTOMS  Some women with bacterial vaginosis have no signs or symptoms. Common symptoms include:  Grey vaginal discharge.  A fishlike odor with discharge, especially after sexual intercourse.  Itching or burning of the vagina and vulva.  Burning or pain with urination. DIAGNOSIS  Your health care provider will take a medical history and examine the vagina for signs of bacterial vaginosis. A sample of vaginal fluid may be taken. Your health care provider will look at this sample under a microscope to check for bacteria and abnormal cells. A vaginal pH test may also be done.  TREATMENT  Bacterial vaginosis may be treated with antibiotic medicines. These may be given in the form of a pill or a vaginal cream. A second round of antibiotics may be prescribed if the condition comes back after treatment. Because bacterial vaginosis increases your risk for sexually transmitted diseases, getting treated can help reduce your risk for chlamydia, gonorrhea, HIV, and herpes. HOME CARE INSTRUCTIONS   Only take over-the-counter or prescription medicines as directed by  your health care provider.  If antibiotic medicine was prescribed, take it as directed. Make sure you finish it even if you start to feel better.  Tell all sexual partners that you have a vaginal infection. They should see their health care provider and be treated if they have problems, such as a mild rash or itching.  During treatment, it is important that you follow these instructions:  Avoid sexual activity or use condoms correctly.  Do not douche.  Avoid alcohol as directed by your health care provider.  Avoid breastfeeding as directed by your health care provider. SEEK MEDICAL CARE IF:   Your symptoms are not improving after 3 days of treatment.  You have increased discharge or pain.  You have a fever. MAKE SURE YOU:   Understand these instructions.  Will watch your condition.  Will get help right away if you are not doing well or get worse. FOR MORE INFORMATION  Centers for Disease Control and Prevention, Division of STD Prevention: SolutionApps.co.zawww.cdc.gov/std American Sexual Health Association (ASHA): www.ashastd.org    This information is not intended to replace advice given to you by your health care provider. Make sure you discuss any questions you have with your health care provider.   Document Released: 11/11/2005 Document Revised: 12/02/2014 Document Reviewed: 06/23/2013 Elsevier Interactive  Patient Education 2016 [employer].

## 2015-12-15 ENCOUNTER — Emergency Department (HOSPITAL_COMMUNITY)
Admission: EM | Admit: 2015-12-15 | Discharge: 2015-12-15 | Disposition: A | Discharge status: Home / Self Care | Payer: Self-pay | Attending: Emergency Medicine | Admitting: Emergency Medicine

## 2015-12-15 ENCOUNTER — Encounter (HOSPITAL_COMMUNITY): Payer: Self-pay | Admitting: *Deleted

## 2015-12-15 DIAGNOSIS — L02412 Cutaneous abscess of left axilla: Secondary | ICD-10-CM | POA: Insufficient documentation

## 2015-12-15 DIAGNOSIS — F17210 Nicotine dependence, cigarettes, uncomplicated: Secondary | ICD-10-CM | POA: Insufficient documentation

## 2015-12-15 DIAGNOSIS — L0291 Cutaneous abscess, unspecified: Secondary | ICD-10-CM

## 2015-12-15 DIAGNOSIS — L02411 Cutaneous abscess of right axilla: Secondary | ICD-10-CM | POA: Insufficient documentation

## 2015-12-15 DIAGNOSIS — L732 Hidradenitis suppurativa: Secondary | ICD-10-CM | POA: Insufficient documentation

## 2015-12-15 DIAGNOSIS — Z792 Long term (current) use of antibiotics: Secondary | ICD-10-CM | POA: Insufficient documentation

## 2015-12-15 MED ORDER — OXYCODONE-ACETAMINOPHEN 5-325 MG PO TABS: 2.0000 | ORAL_TABLET | ORAL | Status: DC | PRN

## 2015-12-15 MED ORDER — OXYCODONE-ACETAMINOPHEN 5-325 MG PO TABS
2.0000 | ORAL_TABLET | Freq: Once | ORAL | Status: AC
  Administered 2015-12-15: 2 via ORAL
  Filled 2015-12-15: qty 2

## 2015-12-15 MED ORDER — LIDOCAINE HCL (PF) 1 % IJ SOLN: 30.0000 mL | Freq: Once | INTRAMUSCULAR | Status: DC

## 2015-12-15 MED ORDER — MORPHINE SULFATE (PF) 4 MG/ML IV SOLN
4.0000 mg | Freq: Once | INTRAVENOUS | Status: AC
  Administered 2015-12-15: 4 mg via INTRAMUSCULAR
  Filled 2015-12-15: qty 1

## 2015-12-15 MED ORDER — SULFAMETHOXAZOLE-TRIMETHOPRIM 800-160 MG PO TABS
1.0000 | ORAL_TABLET | Freq: Two times a day (BID) | ORAL | Status: AC
Start: 2015-12-15 — End: 2015-12-22

## 2015-12-15 NOTE — ED Notes (Signed)
PA-C at bedside 

## 2015-12-15 NOTE — ED Notes (Signed)
Patient verbalized understanding of discharge instructions and denies any further needs or questions at this time. VS stable. Patient reports pain is still bad, but "better than it was before." PA made aware. Patient ambulatory with steady gait.

## 2015-12-15 NOTE — ED Notes (Signed)
See PA assessment for secondary 

## 2015-12-15 NOTE — ED Provider Notes (Signed)
CSN: [id number]     Arrival date & time 12/15/15  2133 History  By signing my name below, I, Lyndel Safe, attest that this documentation has been prepared under the direction and in the presence of Glean Hess, [address] Electronically Signed: Lyndel Safe, ED Scribe. 12/15/2015. 10:18 PM.   Chief Complaint  Patient presents with  . Abscess    The history is provided by the patient. No language interpreter was used.    HPI Comments: Stephanie Reid is a 25 y.o. 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 Pt reports multiple abscesses that are draining to her right axilla and 1 non-draining abscess to her left axilla. She reports recurrent abscesses to her bilateral axillas every month that she ultimately has drained by a provider in the ED. Pt notes on her last ED visit for evaluation of abscesses she was given an antibiotic course that she was compliant with. Denies fevers or chills, nausea, vomiting, numbness, tingling, or weakness.    History reviewed. No pertinent past medical history. History reviewed. No pertinent past surgical history. Family History  Problem Relation Age of Onset  . Hypertension Mother   . Heart disease Mother   . Stroke Mother   . Diabetes Maternal Grandmother    Social History  Substance Use Topics  . Smoking status: Current Every Day Smoker -- 0.10 packs/day    Types: Cigarettes  . Smokeless tobacco: Never Used  . Alcohol Use: Yes     Comment: occasion   OB History    No data available       Review of Systems  Constitutional: Negative for fever and chills.  Gastrointestinal: Negative for nausea and vomiting.  Skin: Positive for wound (abscesses to B/L axillas).  Neurological: Negative for weakness and numbness.    Allergies  Apple; Banana; Orange fruit;  Lactase-lactobacillus; Spinach; and Vicodin  Home Medications   Prior to Admission medications   Medication Sig Start Date End Date Taking? Authorizing Provider  cephALEXin (KEFLEX) 500 MG capsule Take 500 mg by mouth 4 (four) times daily.    Historical Provider, MD  ibuprofen (ADVIL,MOTRIN) 600 MG tablet Take 1 tablet (600 mg total) by mouth every 6 (six) hours as needed. Patient not taking: Reported on 12/11/2015 08/13/15   Loren Racer, MD  loratadine (CLARITIN) 10 MG tablet Take 1 tablet (10 mg total) by mouth daily. Patient not taking: Reported on 12/11/2015 08/13/15   Loren Racer, MD  meclizine (ANTIVERT) 25 MG tablet Take 1 tablet (25 mg total) by mouth 3 (three) times daily as needed for dizziness. Patient not taking: Reported on 12/11/2015 08/13/15   Loren Racer, MD  metroNIDAZOLE (FLAGYL) 500 MG tablet Take 1 tablet (500 mg total) by mouth 2 (two) times daily. 12/11/15   Tiffany Neva Seat, PA-C  mometasone (NASONEX) 50 MCG/ACT nasal spray Place 2 sprays into the nose daily. Patient not taking: Reported on 12/11/2015 08/13/15   Loren Racer, MD  oxyCODONE-acetaminophen (PERCOCET/ROXICET) 5-325 MG tablet Take 2 tablets by mouth every 4 (four) hours as needed for severe pain. 12/15/15   Mady Gemma, PA-C  sulfamethoxazole-trimethoprim (BACTRIM DS,SEPTRA DS) 800-160 MG tablet Take 1 tablet by mouth 2 (two) times daily. 12/15/15 12/22/15  Mady Gemma, PA-C  traMADol (ULTRAM) 50 MG tablet Take 1 tablet (50 mg total) by mouth every 6 (six) hours as needed. 12/11/15  Tiffany Neva Seat, PA-C    BP 107/65 mmHg  Pulse 86  Temp(Src) 98.8 F (37.1 C) (Oral)  Resp 18  Wt 211 lb 5 oz (95.851 kg)  SpO2 100% Physical Exam  Constitutional: She is oriented to person, place, and time. She appears well-developed and well-nourished. No distress.  HENT:  Head: Normocephalic and atraumatic.  Right Ear: External ear normal.  Left Ear: External ear normal.  Nose: Nose normal.  Eyes:  Conjunctivae and EOM are normal. Right eye exhibits no discharge. Left eye exhibits no discharge. No scleral icterus.  Neck: Normal range of motion. Neck supple.  Cardiovascular: Normal rate and regular rhythm.   Pulmonary/Chest: Effort normal and breath sounds normal. No respiratory distress.  Musculoskeletal: Normal range of motion. She exhibits no edema or tenderness.  Neurological: She is alert and oriented to person, place, and time.  Skin: Skin is warm and dry. She is not diaphoretic.  Multiple areas of induration to right and left axilla. Area of drainage to right axilla and left axilla. 2 cm area of fluctuance to left axilla with associated induration.  Psychiatric: She has a normal mood and affect. Her behavior is normal.  Nursing note and vitals reviewed.   ED Course  Procedures   DIAGNOSTIC STUDIES: Oxygen Saturation is 100% on RA, normal by my interpretation.    COORDINATION OF CARE: 10:06 PM Discussed treatment plan which includes to perform I&D procedure and prescribe antibiotic course with pt. Pt acknowledges and agrees to plan.   INCISION AND DRAINAGE PROCEDURE NOTE: Patient identification was confirmed and verbal consent was obtained. This procedure was performed by Glean Hess, PA-C at 10:30 PM. Site: left axilla  Sterile procedures observed Anesthetic used (type and amt): lidocaine 1% w/o epinephrine  Blade size: 11 Drainage: scant purulent drainage  Site anesthetized, incision made over site, wound drained and explored loculations, rinsed with copious amounts of normal saline, wound, covered with dry, sterile dressing.  Pt tolerated procedure well without complications.  Instructions for care discussed verbally and pt provided with additional written instructions for homecare and f/u.  MDM   Final diagnoses:  Abscess  Hidradenitis suppurativa    25 year old female presents with abscess to bilateral axilla. She states this is a recurrent problem. She  reports associated drainage. She denies fever, chills, nausea, vomiting.  Patient is afebrile. Vital signs stable. Multiple areas of induration to right and left axilla. Area of drainage to right axilla and left axilla. 2 cm area of fluctuance to left axilla with associated induration.   Incision and drainage performed in the ED (left abscess to left axilla with fluctuance). Case management consulted regarding follow-up. Patient has follow-up arranged with sickle cell clinic. Will discharge with bactrim and short course of pain medication. Return precautions discussed. Patient verbalizes her understanding and is in agreement with plan.  BP 105/71 mmHg  Pulse 70  Temp(Src) 98.8 F (37.1 C) (Oral)  Resp 16  Wt 95.851 kg  SpO2 94%  I personally performed the services described in this documentation, which was scribed in my presence. The recorded information has been reviewed and is accurate.    Mady Gemma, PA-C 12/16/15 [id number]  Lavera Guise, MD 12/16/15 1315

## 2015-12-15 NOTE — Discharge Instructions (Signed)
1. Medications: bactrim, usual home medications 2. Treatment: rest, drink plenty of fluids 3. Follow Up: please followup with your primary doctor for discussion of your diagnoses and further evaluation after today's visit; if you do not have a primary care doctor use the resource guide provided to find one; please return to the ER for high fever, increased pain, new or worsening symptoms   Abscess An abscess is an infected area that contains a collection of pus and debris.It can occur in almost any part of the body. An abscess is also known as a furuncle or boil. CAUSES  An abscess occurs when tissue gets infected. This can occur from blockage of oil or sweat glands, infection of hair follicles, or a minor injury to the skin. As the body tries to fight the infection, pus collects in the area and creates pressure under the skin. This pressure causes pain. People with weakened immune systems have difficulty fighting infections and get certain abscesses more often.  SYMPTOMS Usually an abscess develops on the skin and becomes a painful mass that is red, warm, and tender. If the abscess forms under the skin, you may feel a moveable soft area under the skin. Some abscesses break open (rupture) on their own, but most will continue to get worse without care. The infection can spread deeper into the body and eventually into the bloodstream, causing you to feel ill.  DIAGNOSIS  Your caregiver will take your medical history and perform a physical exam. A sample of fluid may also be taken from the abscess to determine what is causing your infection. TREATMENT  Your caregiver may prescribe antibiotic medicines to fight the infection. However, taking antibiotics alone usually does not cure an abscess. Your caregiver may need to make a small cut (incision) in the abscess to drain the pus. In some cases, gauze is packed into the abscess to reduce pain and to continue draining the area. HOME CARE INSTRUCTIONS    Only take over-the-counter or prescription medicines for pain, discomfort, or fever as directed by your caregiver.  If you were prescribed antibiotics, take them as directed. Finish them even if you start to feel better.  If gauze is used, follow your caregiver's directions for changing the gauze.  To avoid spreading the infection:  Keep your draining abscess covered with a bandage.  Wash your hands well.  Do not share personal care items, towels, or whirlpools with others.  Avoid skin contact with others.  Keep your skin and clothes clean around the abscess.  Keep all follow-up appointments as directed by your caregiver. SEEK MEDICAL CARE IF:   You have increased pain, swelling, redness, fluid drainage, or bleeding.  You have muscle aches, chills, or a general ill feeling.  You have a fever. MAKE SURE YOU:   Understand these instructions.  Will watch your condition.  Will get help right away if you are not doing well or get worse.   This information is not intended to replace advice given to you by your health care provider. Make sure you discuss any questions you have with your health care provider.   Document Released: 08/21/2005 Document Revised: 05/12/2012 Document Reviewed: 01/24/2012 Elsevier Interactive Patient Education 2016 Elsevier Inc.  Hidradenitis Suppurativa Hidradenitis suppurativa is a long-term (chronic) skin disease that starts with blocked sweat glands or hair follicles. Bacteria may grow in these blocked openings of your skin. Hidradenitis suppurativa is like a severe form of acne that develops in areas of your body where acne would  be unusual. It is most likely to affect the areas of your body where skin rubs against skin and becomes moist. This includes your:  Underarms.  Groin.  Genital areas.  Buttocks.  Upper thighs.  Breasts. Hidradenitis suppurativa may start out with small pimples. The pimples can develop into deep sores that  break open (rupture) and drain pus. Over time your skin may thicken and become scarred. Hidradenitis suppurativa cannot be passed from person to person.  CAUSES  The exact cause of hidradenitis suppurativa is not known. This condition may be due to:  Female and female hormones. The condition is rare before and after puberty.  An overactive body defense system (immune system). Your immune system may overreact to the blocked hair follicles or sweat glands and cause swelling and pus-filled sores. RISK FACTORS You may have a higher risk of hidradenitis suppurativa if you:  Are a woman.  Are between ages 8 and 103.  Have a family history of hidradenitis suppurativa.  Have a personal history of acne.  Are overweight.  Smoke.  Take the drug lithium. SIGNS AND SYMPTOMS  The first signs of an outbreak are usually painful skin bumps that look like pimples. As the condition progresses:  Skin bumps may get bigger and grow deeper into the skin.  Bumps under the skin may rupture and drain smelly pus.  Skin may become itchy and infected.  Skin may thicken and scar.  Drainage may continue through tunnels under the skin (fistulas).  Walking and moving your arms can become painful. DIAGNOSIS  Your health care provider may diagnose hidradenitis suppurativa based on your medical history and your signs and symptoms. A physical exam will also be done. You may need to see a health care provider who specializes in skin diseases (dermatologist). You may also have tests done to confirm the diagnosis. These can include:  Swabbing a sample of pus or drainage from your skin so it can be sent to the lab and tested for infection.  Blood tests to check for infection. TREATMENT  The same treatment will not work for everybody with hidradenitis suppurativa. Your treatment will depend on how severe your symptoms are. You may need to try several treatments to find what works best for you. Part of your treatment  may include cleaning and bandaging (dressing) your wounds. You may also have to take medicines, such as the following:  Antibiotics.  Acne medicines.  Medicines to block or suppress the immune system.  A diabetes medicine (metformin) is sometimes used to treat this condition.  For women, birth control pills can sometimes help relieve symptoms. You may need surgery if you have a severe case of hidradenitis suppurativa that does not respond to medicine. Surgery may involve:   Using a laser to clear the skin and remove hair follicles.  Opening and draining deep sores.  Removing the areas of skin that are diseased and scarred. HOME CARE INSTRUCTIONS  Learn as much as you can about your disease, and work closely with your health care providers.  Take medicines only as directed by your health care provider.  If you were prescribed an antibiotic medicine, finish it all even if you start to feel better.  If you are overweight, losing weight may be very helpful. Try to reach and maintain a healthy weight.  Do not use any tobacco products, including cigarettes, chewing tobacco, or electronic cigarettes. If you need help quitting, ask your health care provider.  Do not shave the areas where you  get hidradenitis suppurativa.  Do not wear deodorant.  Wear loose-fitting clothes.  Try not to overheat and get sweaty.  Take a daily bleach bath as directed by your health care provider.  Fill your bathtub halfway with water.  Pour in  cup of unscented household bleach.  Soak for 5-10 minutes.  Cover sore areas with a warm, clean washcloth (compress) for 5-10 minutes. SEEK MEDICAL CARE IF:   You have a flare-up of hidradenitis suppurativa.  You have chills or a fever.  You are having trouble controlling your symptoms at home.   This information is not intended to replace advice given to you by your health care provider. Make sure you discuss any questions you have with your health  care provider.   Document Released: 06/25/2004 Document Revised: 12/02/2014 Document Reviewed: 02/11/2014 Elsevier Interactive Patient Education 2016 [employer].   Emergency Department Resource Guide 1) Find a Doctor and Pay Out of Pocket Although you won't have to find out who is covered by your insurance plan, it is a good idea to ask around and get recommendations. You will then need to call the office and see if the doctor you have chosen will accept you as a new patient and what types of options they offer for patients who are self-pay. Some doctors offer discounts or will set up payment plans for their patients who do not have insurance, but you will need to ask so you aren't surprised when you get to your appointment.  2) Contact Your Local Health Department Not all health departments have doctors that can see patients for sick visits, but many do, so it is worth a call to see if yours does. If you don't know where your local health department is, you can check in your phone book. The CDC also has a tool to help you locate your state's health department, and many state websites also have listings of all of their local health departments.  3) Find a Walk-in Clinic If your illness is not likely to be very severe or complicated, you may want to try a walk in clinic. These are popping up all over the country in pharmacies, drugstores, and shopping centers. They're usually staffed by nurse practitioners or physician assistants that have been trained to treat common illnesses and complaints. They're usually fairly quick and inexpensive. However, if you have serious medical issues or chronic medical problems, these are probably not your best option.  No Primary Care Doctor: - Call Health Connect at  [phone] - they can help you locate a primary care doctor that  accepts your insurance, provides certain services, etc. - Physician Referral Service- [phone]  Chronic Pain  Problems: Organization         Address  Phone   Notes  [facility]  [phone] Patients need to be referred by their primary care doctor.   Medication Assistance: Organization         Address  Phone   Notes  [facility] Medication [facility] [address] [place], [place] [address] [phone] --Must be a resident of [facility] -- Must have NO insurance coverage whatsoever (no Medicaid/ Medicare, etc.) -- The pt. MUST have a primary care doctor that directs their care regularly and follows them in the community   [facility]ist  [phone]   [employer]  [phone]    Agencies that provide inexpensive medical care: Organization         Address  Phone   Notes  [facility]  [phone]   [facility]    [phone]   [facility] [address] [place], [place] [address] [phone]   [facility] [address], [place] [phone]   Planned Parenthood    [phone]   [facility]    [phone]   [facility]  [address], [place] Phone:  [phone], Fax:  [phone] Hours of Operation:  9 am - 6 pm, M-F.  Also accepts Medicaid/Medicare and self-pay.  [facility]  [address], [place] Phone: [phone], Fax: [phone]. Hours of Operation:  8:30 am - 5:30 pm, M-F.  Also accepts Medicaid and self-pay.  [facility] [place] [address], [place] Phone: [phone]   [facility] [address] Natasha Bence [place], [place] [phone], Ext. [phone] Mondays & Thursdays: 7-9 AM.  First 15 patients are seen on a first come, first serve basis.    Medicaid-accepting [facility] Providers:  Organization         Address  Phone   Notes  [facility] [address], [place] [phone] Also  accepts self-pay patients.  [facility] [address] Laurell Josephs [place], [place]  [phone]   [facility] [address], [place] [phone]   [facility] [address] [place] [phone]   Renaye Rakers [address], [place]   [phone] Only accepts [place] Access [place] patients after they have their name applied to their card.   Self-Pay (no insurance) in [facility]:  Organization         Address  Phone   Notes  Sickle Cell Patients, [facility] Internal Medicine [address] [place], [place] [phone]   [facility] [address] [place], [place] [phone]   [facility] [place]  [address], [place] [phone]   [facility]/Dr. Osei-Bonsu  [address] [place] or [address], [place] [phone] Phone number for both [place] and [place] locations is the same.  [facility] [address] [place] [phone]   [facility] [address], [place] or [address] [phone]   [facility] [address] [place] [phone], phone; [phone], fax Sees patients 1st and 3rd Saturday of every month.  Must not qualify for public or private insurance (i.e. Medicaid, Medicare, [place] Health Choice, Veterans' Benefits)  Household income should be no more than 200% of the poverty level The clinic cannot treat you if you are pregnant or think you are pregnant  Sexually transmitted diseases are not treated at the clinic.    Dental Care: Organization         Address  Phone  Notes  [facility] [address] [place], [place] [phone] Accepts children up to age 30 who are enrolled in [place] or [place] Health Choice; pregnant  women with a Medicaid card; and children who have applied for Medicaid or [place] Health Choice, but were declined, whose parents can pay a reduced fee at time of service.  [facility]  [address], [place] [phone] Accepts children up to age 63 who are enrolled in [place] or [place] Health Choice; pregnant women with a Medicaid card; and children who have applied for Medicaid or [place] Health Choice, but were declined, whose parents can pay a reduced fee at time of service.  [facility]  [address] [place], [place] [phone] Patients are seen by appointment only. Walk-ins are not accepted. [facility] will see patients 42 years of age and older. Monday - Tuesday (8am-5pm) Most Wednesdays (8:30-5pm) $30 per visit, cash only  [facility]  [address], [facility] [phone] Patients are seen by appointment only. Walk-ins are not accepted. [facility] will see patients 69 years of age and older. One Wednesday Evening (Monthly: Volunteer Based).  $30 per visit, cash only  [employer]  [phone] for adults; Children under age 32, call Graduate Pediatric Dentistry at [phone]. Children aged 7-14, please call [phone] to request a pediatric application.  Dental services are provided in all areas of dental care including fillings, crowns and bridges, complete and partial dentures, implants, gum treatment, root canals, and extractions. Preventive care is also provided. Treatment is provided to both adults and children. Patients are selected via a lottery and there is often a waiting list.   [facility] [address], [place]  [phone] [email or web address]   [facility] [address] [place], [place] [phone], Ext. [phone] Second and Fourth Thursday of each month, opens at 6:30 AM; Clinic ends at 9 AM.  Patients are  seen on a first-come first-served basis, and a limited number are seen during each clinic.   [facility]  [address] Ether Griffins [place], [place] [phone]   Eligibility Requirements You must have lived in [place], [place], or [place] counties for at least the last three months.   You cannot be eligible for state or federal sponsored [place], including [employer], [place], or [employer].   You generally cannot be eligible for healthcare insurance through your employer.    How to apply: Eligibility screenings are held every Tuesday and Wednesday afternoon from 1:00 pm until 4:00 pm. You do not need an appointment for the interview!  [facility] [address], [place], [place] [phone]   [facility] Health Department  [phone]   [facility] Health Department  [phone]   [facility] Health Department  [phone]    Behavioral Health Resources in the Community: Intensive Outpatient Programs Organization         Address  Phone  Notes  [facility] Services [address] [place], [place] [phone]   [facility] Outpatient [address], [place], [place] [phone]   [facility] [address], [place], [place]  [phone]   [facility] Mental Health [address]  [place], [place] [phone] or [phone]   Substance Abuse Resources Organization         Address  Phone  Notes  [facility] Drug [facility]  [phone]   [facility]  [phone]   [facility] [place]  [phone]   Floydene Flock  [phone]   Residential & Outpatient Substance Abuse Program  [phone]   Psychological Services Organization         Address  Phone  Notes  [facility] Behavioral Health  [phone]   [facility]  [phone]   [facility] Mental Health [address], [place] [phone] or [phone]    Mobile Crisis  Teams Organization         Address  Phone  Notes  [facility], Mobile Crisis Care Unit  [phone]   [facility]  [address] [place], [place] [phone]   Doristine Locks [address] [place] [phone]    Self-Help/Support Groups Organization         Address  Phone             Notes  [facility] [place] - variety of support groups  336- [phone] Call for more information  Narcotics Anonymous (NA), [facility] [address], [employer] [place]  2 meetings at this location   Statistician         Address  Phone  Notes  [facility] [address],    [place]  [phone]   [facility]  [address] [place] [address], [place], [place] [phone]   [facility] [address] [place], [place] [phone] Admissions: 8am-3pm M-F  [facility] [address],    [place], [place] [phone]   [facility] [address] [place], [place], [place] [phone]   [facility] [address]  [place], [place] [phone]   [facility] - Intensive Outpatient [address] Laurell Josephs [address], [place], [place] [phone]   [facility] ([facility] [address] [place]  [place], [place] [phone] or [phone]   [facility] ([facility]) [address], [place], [place] [phone] Accepts Medicaid  [facility] [facility] [address]  [place] [phone] Substance Abuse/Addiction Treatment   [facility] Organization         Address  Phone  Notes  [facility]  [phone]   Angie Fava, PhD [address] Ervin Knack [place], [place]   [phone] or [phone]   [facility]   [address] [place], [place] [phone]   [facility] [address] [place], [place] [phone]  Insurance/Medicaid/sponsorship through [facility] and Families [address]                                    [place], [place] [phone] Therapy/tele-psych/case  [facility] [address], [place] [phone]    Dr. Lolly Mustache  [phone]   [facility]  [employer] [facility] Dept. 1) [address], [place] 2) [address] [place] 3)  [address], [place] [phone]  [phone]   [facility] Child Abuse Hotline [phone] or [phone] (After Hours)

## 2015-12-15 NOTE — ED Notes (Signed)
Pt in c/o multiple abscesses under bilateral axilla, states she gets them every month. No distress noted.

## 2016-04-24 ENCOUNTER — Emergency Department (HOSPITAL_COMMUNITY)
Admission: EM | Admit: 2016-04-24 | Discharge: 2016-04-25 | Disposition: A | Discharge status: Home / Self Care | Payer: Self-pay | Attending: Emergency Medicine | Admitting: Emergency Medicine

## 2016-04-24 ENCOUNTER — Encounter (HOSPITAL_COMMUNITY): Payer: Self-pay | Admitting: Emergency Medicine

## 2016-04-24 DIAGNOSIS — N760 Acute vaginitis: Secondary | ICD-10-CM | POA: Insufficient documentation

## 2016-04-24 DIAGNOSIS — R319 Hematuria, unspecified: Secondary | ICD-10-CM | POA: Insufficient documentation

## 2016-04-24 DIAGNOSIS — B9689 Other specified bacterial agents as the cause of diseases classified elsewhere: Secondary | ICD-10-CM | POA: Insufficient documentation

## 2016-04-24 DIAGNOSIS — F17210 Nicotine dependence, cigarettes, uncomplicated: Secondary | ICD-10-CM | POA: Insufficient documentation

## 2016-04-24 DIAGNOSIS — B3731 Acute candidiasis of vulva and vagina: Secondary | ICD-10-CM

## 2016-04-24 DIAGNOSIS — B373 Candidiasis of vulva and vagina: Secondary | ICD-10-CM

## 2016-04-24 LAB — POC URINE PREG, ED: PREG TEST UR: NEGATIVE

## 2016-04-24 NOTE — ED Notes (Signed)
C/o hematuria and vaginal pain/irritation x 1 week.  Reports LMP 7 yrs ago.

## 2016-04-25 LAB — URINALYSIS, ROUTINE W REFLEX MICROSCOPIC
Bilirubin Urine: NEGATIVE
Glucose, UA: NEGATIVE mg/dL
Hgb urine dipstick: NEGATIVE
KETONES UR: 15 mg/dL — AB
Nitrite: NEGATIVE
PROTEIN: NEGATIVE mg/dL
Specific Gravity, Urine: 1.025 (ref 1.005–1.030)
pH: 6 (ref 5.0–8.0)

## 2016-04-25 LAB — URINE MICROSCOPIC-ADD ON

## 2016-04-25 LAB — WET PREP, GENITAL
SPERM: NONE SEEN
Trich, Wet Prep: NONE SEEN

## 2016-04-25 LAB — GC/CHLAMYDIA PROBE AMP (~~LOC~~) NOT AT ARMC
CHLAMYDIA, DNA PROBE: NEGATIVE
Neisseria Gonorrhea: NEGATIVE

## 2016-04-25 MED ORDER — FLUCONAZOLE 100 MG PO TABS
150.0000 mg | ORAL_TABLET | Freq: Once | ORAL | Status: AC
  Administered 2016-04-25: 150 mg via ORAL
  Filled 2016-04-25: qty 2

## 2016-04-25 MED ORDER — METRONIDAZOLE 500 MG PO TABS: 500.0000 mg | ORAL_TABLET | Freq: Two times a day (BID) | ORAL | Status: DC

## 2016-04-25 MED ORDER — FLUCONAZOLE 200 MG PO TABS: 200.0000 mg | ORAL_TABLET | Freq: Every day | ORAL | Status: AC

## 2016-04-25 MED ORDER — METRONIDAZOLE 500 MG PO TABS
500.0000 mg | ORAL_TABLET | Freq: Once | ORAL | Status: AC
  Administered 2016-04-25: 500 mg via ORAL
  Filled 2016-04-25: qty 1

## 2016-04-25 NOTE — Discharge Instructions (Signed)
Bacterial Vaginosis Bacterial vaginosis is a vaginal infection that occurs when the normal balance of bacteria in the vagina is disrupted. It results from an overgrowth of certain bacteria. This is the most common vaginal infection in women of childbearing age. Treatment is important to prevent complications, especially in pregnant women, as it can cause a premature delivery. CAUSES  Bacterial vaginosis is caused by an increase in harmful bacteria that are normally present in smaller amounts in the vagina. Several different kinds of bacteria can cause bacterial vaginosis. However, the reason that the condition develops is not fully understood. RISK FACTORS Certain activities or behaviors can put you at an increased risk of developing bacterial vaginosis, including:  Having a new sex partner or multiple sex partners.  Douching.  Using an intrauterine device (IUD) for contraception. Women do not get bacterial vaginosis from toilet seats, bedding, swimming pools, or contact with objects around them. SIGNS AND SYMPTOMS  Some women with bacterial vaginosis have no signs or symptoms. Common symptoms include:  Grey vaginal discharge.  A fishlike odor with discharge, especially after sexual intercourse.  Itching or burning of the vagina and vulva.  Burning or pain with urination. DIAGNOSIS  Your health care provider will take a medical history and examine the vagina for signs of bacterial vaginosis. A sample of vaginal fluid may be taken. Your health care provider will look at this sample under a microscope to check for bacteria and abnormal cells. A vaginal pH test may also be done.  TREATMENT  Bacterial vaginosis may be treated with antibiotic medicines. These may be given in the form of a pill or a vaginal cream. A second round of antibiotics may be prescribed if the condition comes back after treatment. Because bacterial vaginosis increases your risk for sexually transmitted diseases, getting  treated can help reduce your risk for chlamydia, gonorrhea, HIV, and herpes. HOME CARE INSTRUCTIONS   Only take over-the-counter or prescription medicines as directed by your health care provider.  If antibiotic medicine was prescribed, take it as directed. Make sure you finish it even if you start to feel better.  Tell all sexual partners that you have a vaginal infection. They should see their health care provider and be treated if they have problems, such as a mild rash or itching.  During treatment, it is important that you follow these instructions:  Avoid sexual activity or use condoms correctly.  Do not douche.  Avoid alcohol as directed by your health care provider.  Avoid breastfeeding as directed by your health care provider. SEEK MEDICAL CARE IF:   Your symptoms are not improving after 3 days of treatment.  You have increased discharge or pain.  You have a fever. MAKE SURE YOU:   Understand these instructions.  Will watch your condition.  Will get help right away if you are not doing well or get worse. FOR MORE INFORMATION  Centers for Disease Control and Prevention, Division of STD Prevention: www.cdc.gov/std American Sexual Health Association (ASHA): www.ashastd.org    This information is not intended to replace advice given to you by your health care provider. Make sure you discuss any questions you have with your health care provider.   Document Released: 11/11/2005 Document Revised: 12/02/2014 Document Reviewed: 06/23/2013 Elsevier Interactive Patient Education 2016 Elsevier Inc. Monilial Vaginitis Vaginitis in a soreness, swelling and redness (inflammation) of the vagina and vulva. Monilial vaginitis is not a sexually transmitted infection. CAUSES  Yeast vaginitis is caused by yeast (candida) that is normally found in   your vagina. With a yeast infection, the candida has overgrown in number to a point that upsets the chemical balance. SYMPTOMS   White,  thick vaginal discharge.  Swelling, itching, redness and irritation of the vagina and possibly the lips of the vagina (vulva).  Burning or painful urination.  Painful intercourse. DIAGNOSIS  Things that may contribute to monilial vaginitis are:  Postmenopausal and virginal states.  Pregnancy.  Infections.  Being tired, sick or stressed, especially if you had monilial vaginitis in the past.  Diabetes. Good control will help lower the chance.  Birth control pills.  Tight fitting garments.  Using bubble bath, feminine sprays, douches or deodorant tampons.  Taking certain medications that kill germs (antibiotics).  Sporadic recurrence can occur if you become ill. TREATMENT  Your caregiver will give you medication.  There are several kinds of anti monilial vaginal creams and suppositories specific for monilial vaginitis. For recurrent yeast infections, use a suppository or cream in the vagina 2 times a week, or as directed.  Anti-monilial or steroid cream for the itching or irritation of the vulva may also be used. Get your caregiver's permission.  Painting the vagina with methylene blue solution may help if the monilial cream does not work.  Eating yogurt may help prevent monilial vaginitis. HOME CARE INSTRUCTIONS   Finish all medication as prescribed.  Do not have sex until treatment is completed or after your caregiver tells you it is okay.  Take warm sitz baths.  Do not douche.  Do not use tampons, especially scented ones.  Wear cotton underwear.  Avoid tight pants and panty hose.  Tell your sexual partner that you have a yeast infection. They should go to their caregiver if they have symptoms such as mild rash or itching.  Your sexual partner should be treated as well if your infection is difficult to eliminate.  Practice safer sex. Use condoms.  Some vaginal medications cause latex condoms to fail. Vaginal medications that harm condoms are:  Cleocin  cream.  Butoconazole (Femstat).  Terconazole (Terazol) vaginal suppository.  Miconazole (Monistat) (may be purchased over the counter). SEEK MEDICAL CARE IF:   You have a temperature by mouth above 102 F (38.9 C).  The infection is getting worse after 2 days of treatment.  The infection is not getting better after 3 days of treatment.  You develop blisters in or around your vagina.  You develop vaginal bleeding, and it is not your menstrual period.  You have pain when you urinate.  You develop intestinal problems.  You have pain with sexual intercourse.   This information is not intended to replace advice given to you by your health care provider. Make sure you discuss any questions you have with your health care provider.   Document Released: 08/21/2005 Document Revised: 02/03/2012 Document Reviewed: 05/15/2015 Elsevier Interactive Patient Education 2016 Elsevier Inc.  

## 2016-04-25 NOTE — ED Provider Notes (Signed)
CSN: [id number]     Arrival date & time 04/24/16  2305 History  By signing my name below, I, Bethel Born, attest that this documentation has been prepared under the direction and in the presence of Azalia Bilis, MD. Electronically Signed: Bethel Born, ED Scribe. 04/25/2016. 12:47 AM   Chief Complaint  Patient presents with  . Vaginal Pain  . Hematuria   The history is provided by the patient. No language interpreter was used.   Stephanie Reid is a 25 y.o. female who presents to the Emergency Department complaining of new constant, 6/10 in severity, vaginal pain and itching with onset 5 days ago. Associated symptoms include yellow vaginal discharge and dysuria. Pt denies fever, chills, nausea, vomiting. She states that she has had gonorrhea, chlamydia, and trichomonas in the past but they were treated. She has 1 sexual partner. No recent abx use.   History reviewed. No pertinent past medical history. History reviewed. No pertinent past surgical history. Family History  Problem Relation Age of Onset  . Hypertension Mother   . Heart disease Mother   . Stroke Mother   . Diabetes Maternal Grandmother    Social History  Substance Use Topics  . Smoking status: Current Every Day Smoker -- 0.10 packs/day    Types: Cigarettes  . Smokeless tobacco: Never Used  . Alcohol Use: Yes     Comment: occasion   OB History    No data available     Review of Systems  10 Systems reviewed and all are negative for acute change except as noted in the HPI.  Allergies  Apple; Banana; Orange fruit; Lactase-lactobacillus; Spinach; and Vicodin  Home Medications   Prior to Admission medications   Not on File   BP 116/87 mmHg  Pulse 92  Temp(Src) 98.1 F (36.7 C) (Oral)  Resp 18  Ht  (1.676 m)  Wt 215 lb (97.523 kg)  BMI 34.72 kg/m2  SpO2 98% Physical Exam  Constitutional: She is oriented to person, place, and time. She appears well-developed and well-nourished. No distress.   HENT:  Head: Normocephalic and atraumatic.  Eyes: EOM are normal.  Neck: Normal range of motion.  Cardiovascular: Normal rate, regular rhythm and normal heart sounds.   Pulmonary/Chest: Effort normal and breath sounds normal.  Abdominal: Soft. She exhibits no distension. There is no tenderness.  Genitourinary:  Normal external genitalia Thin yellow vaginal discharge No adnexal tenderness or fullness No CMT Cervical os is closed Female chaperone present   Musculoskeletal: Normal range of motion.  Neurological: She is alert and oriented to person, place, and time.  Skin: Skin is warm and dry.  Psychiatric: She has a normal mood and affect. Judgment normal.  Nursing note and vitals reviewed.   ED Course  Procedures (including critical care time) DIAGNOSTIC STUDIES: Oxygen Saturation is 98% on RA,  normal by my interpretation.    COORDINATION OF CARE: 12:46 AM Discussed treatment plan which includes pelvic exam and lab work with pt at bedside and pt agreed to plan.  Labs Review Labs Reviewed  WET PREP, GENITAL - Abnormal; Notable for the following:    Yeast Wet Prep HPF POC PRESENT (*)    Clue Cells Wet Prep HPF POC PRESENT (*)    WBC, Wet Prep HPF POC MANY (*)    All other components within normal limits  URINALYSIS, ROUTINE W REFLEX MICROSCOPIC (NOT AT [facility]) - Abnormal; Notable for the following:    Ketones, ur 15 (*)    Leukocytes,  UA SMALL (*)    All other components within normal limits  URINE MICROSCOPIC-ADD ON - Abnormal; Notable for the following:    Squamous Epithelial / LPF 0-5 (*)    Bacteria, UA RARE (*)    All other components within normal limits  POC URINE PREG, ED  GC/CHLAMYDIA PROBE AMP ([place]) NOT AT [facility]    Imaging Review No results found. I have personally reviewed and evaluated these lab results as part of my medical decision-making.   EKG Interpretation None      MDM   Final diagnoses:  None    Patient treated with both Flagyl  and Diflucan.  Discharged home in good condition.  No signs of cervicitis.  Primary care follow-up.  She understands to return to the ER for new or worsening symptoms  I personally performed the services described in this documentation, which was scribed in my presence. The recorded information has been reviewed and is accurate.       Azalia BilisKevin Vivi Piccirilli, MD 04/25/16 [phone]

## 2016-04-25 NOTE — ED Notes (Addendum)
2nd wet prep specimen by dr Patria Manecampos sent  Due to time delay

## 2016-04-25 NOTE — ED Notes (Signed)
Waiting for the results

## 2016-04-25 NOTE — ED Notes (Signed)
The pt is  C/o vaginal pain for 5 days with a yellow discharge  She also has painful ujrination  lmp none

## 2016-12-19 ENCOUNTER — Emergency Department (HOSPITAL_COMMUNITY)
Admission: EM | Admit: 2016-12-19 | Discharge: 2016-12-19 | Disposition: A | Discharge status: Home / Self Care | Payer: Self-pay | Attending: Emergency Medicine | Admitting: Emergency Medicine

## 2016-12-19 ENCOUNTER — Encounter (HOSPITAL_COMMUNITY): Payer: Self-pay | Admitting: Family Medicine

## 2016-12-19 DIAGNOSIS — Z720 Tobacco use: Secondary | ICD-10-CM

## 2016-12-19 DIAGNOSIS — K047 Periapical abscess without sinus: Secondary | ICD-10-CM | POA: Insufficient documentation

## 2016-12-19 DIAGNOSIS — F17210 Nicotine dependence, cigarettes, uncomplicated: Secondary | ICD-10-CM | POA: Insufficient documentation

## 2016-12-19 DIAGNOSIS — K029 Dental caries, unspecified: Secondary | ICD-10-CM | POA: Insufficient documentation

## 2016-12-19 MED ORDER — PENICILLIN V POTASSIUM 500 MG PO TABS: 1000.0000 mg | ORAL_TABLET | Freq: Two times a day (BID) | ORAL | 0 refills | Status: DC

## 2016-12-19 MED ORDER — OXYCODONE-ACETAMINOPHEN 5-325 MG PO TABS
1.0000 | ORAL_TABLET | Freq: Once | ORAL | Status: AC
  Administered 2016-12-19: 1 via ORAL
  Filled 2016-12-19: qty 1

## 2016-12-19 NOTE — ED Provider Notes (Signed)
[facility]-EMERGENCY DEPT Provider Note   CSN: [id number] Arrival date & time: 12/19/16  0012  By signing my name below, I, Alyssa Grove, attest that this documentation has been prepared under the direction and in the presence of [address], [place]. Electronically Signed: Alyssa Grove, ED Scribe. 12/19/16. 1:13 AM.  History   Chief Complaint Chief Complaint  Patient presents with  . Dental Pain   The history is provided by the patient and medical records. No language interpreter was used.  Dental Pain   This is a chronic problem. The current episode started more than 1 week ago. The problem occurs constantly. The problem has been gradually worsening. The pain is at a severity of 10/10. The pain is severe. She has tried acetaminophen (and oragel and motrin) for the symptoms. The treatment provided no relief.   HPI Comments: Stephanie Reid is a 26 y.o. female who presents to the Emergency Department complaining of R lower dental pain for "a few months" that worsened today. Pt describes dental pain as a 10/10, constant, throbbing, right lower dental pain that radiates to her right ear and her head that is exacerbated with chewing and cold or hot foods. She has tried Advil, Tylenol, and Orajel with no relief to pain. Pt has placed a temporary filling in the tooth which she states made the pain worse. She reports associated ear pain and scant watery R ear drainage. Also reports the L lower molars have also been hurting and are decayed. Pt does not have a dentist. +Smoker. Denies drooling, trismus, gum swelling/drainage, sore throat, difficulty swallowing, fevers, chills, CP, SOB, abd pain, N/V/D/C, hematuria, dysuria, myalgias, arthralgias, numbness, tingling, weakness, or any other complaints at this time.    History reviewed. No pertinent past medical history.  Patient Active Problem List   Diagnosis Date Noted  . Amenorrhea, secondary 10/06/2013    History reviewed. No pertinent surgical  history.  OB History    No data available       Home Medications    Prior to Admission medications   Medication Sig Start Date End Date Taking? Authorizing Provider  metroNIDAZOLE (FLAGYL) 500 MG tablet Take 1 tablet (500 mg total) by mouth 2 (two) times daily. 04/25/16   Azalia Bilis, MD    Family History Family History  Problem Relation Age of Onset  . Hypertension Mother   . Heart disease Mother   . Stroke Mother   . Diabetes Maternal Grandmother     Social History Social History  Substance Use Topics  . Smoking status: Current Every Day Smoker    Packs/day: 0.10    Types: Cigarettes  . Smokeless tobacco: Never Used  . Alcohol use Yes     Comment: Once a month    Allergies   Apple; Banana; Orange fruit [citrus]; Lactase-lactobacillus; Spinach; and Vicodin [hydrocodone-acetaminophen]  Review of Systems Review of Systems  Constitutional: Negative for chills and fever.  HENT: Positive for dental problem, ear discharge and ear pain. Negative for drooling, sore throat and trouble swallowing.        No gum swelling  Respiratory: Negative for shortness of breath.   Cardiovascular: Negative for chest pain.  Gastrointestinal: Negative for abdominal pain, constipation, diarrhea, nausea and vomiting.  Genitourinary: Negative for dysuria and hematuria.  Musculoskeletal: Negative for arthralgias and myalgias.  Skin: Negative for color change.  Allergic/Immunologic: Negative for immunocompromised state.  Neurological: Negative for weakness and numbness.  Psychiatric/Behavioral: Negative for confusion.  10 Systems reviewed and are negative for  acute change except as noted in the HPI.   Physical Exam Updated Vital Signs BP 126/83 (BP Location: Right Arm)   Pulse 64   Temp 98 F (36.7 C) (Oral)   Resp 18   Ht 5\' 6"  (1.676 m)   Wt 210 lb (95.3 kg)   SpO2 96%   BMI 33.89 kg/m   Physical Exam  Constitutional: She is oriented to person, place, and time. Vital signs are  normal. She appears well-developed and well-nourished.  Non-toxic appearance. No distress.  Afebrile, nontoxic, NAD  HENT:  Head: Normocephalic and atraumatic.  Right Ear: Hearing, tympanic membrane, external ear and ear canal normal.  Left Ear: Hearing, tympanic membrane, external ear and ear canal normal.  Nose: Nose normal.  Mouth/Throat: Uvula is midline, oropharynx is clear and moist and mucous membranes are normal. No trismus in the jaw. Dental caries present. No dental abscesses or uvula swelling. Tonsils are 0 on the right. Tonsils are 0 on the left. No tonsillar exudate.  Right lower molar #31 with white dental filling in place, mildly TTP, with no surrounding gingival erythema or swelling, no abscess, no evidence of Ludwig's. Left lower molars #17 and #18 decayed without surrounding abscess. Ears are clear bilaterally. Nose clear. Oropharynx clear and moist, without uvular swelling or deviation, no trismus or drooling, no tonsillar swelling or erythema, no exudates.    Eyes: Conjunctivae and EOM are normal. Right eye exhibits no discharge. Left eye exhibits no discharge.  Neck: Normal range of motion. Neck supple.  Cardiovascular: Normal rate and intact distal pulses.   Pulmonary/Chest: Effort normal. No respiratory distress.  Abdominal: Normal appearance. She exhibits no distension.  Musculoskeletal: Normal range of motion.  Neurological: She is alert and oriented to person, place, and time. She has normal strength. No sensory deficit.  Skin: Skin is warm, dry and intact. No rash noted.  Psychiatric: She has a normal mood and affect. Her behavior is normal.  Nursing note and vitals reviewed.  ED Treatments / Results  DIAGNOSTIC STUDIES: Oxygen Saturation is 96% on RA, normal by my interpretation.    COORDINATION OF CARE: 1:12 AM Discussed treatment plan with pt at bedside which includes antibiotics and pt agreed to plan.  Labs (all labs ordered are listed, but only abnormal  results are displayed) Labs Reviewed - No data to display  EKG  EKG Interpretation None       Radiology No results found.  Procedures Procedures (including critical care time)  Medications Ordered in ED Medications  oxyCODONE-acetaminophen (PERCOCET/ROXICET) 5-325 MG per tablet 1 tablet (not administered)     Initial Impression / Assessment and Plan / ED Course  I have reviewed the triage vital signs and the nursing notes.  Pertinent labs & imaging results that were available during my care of the patient were reviewed by me and considered in my medical decision making (see chart for details).     26 y.o. female here with Dental pain associated with dental caries and possible developing dental infection with patient afebrile, non toxic appearing and swallowing secretions well, no evidence of ludwig's. I gave patient referral to dentist and stressed the importance of dental follow up for ultimate management of dental pain.  I have also discussed reasons to return immediately to the ER.  Patient expresses understanding and agrees with plan.  I will also give PCN VK. Advised tylenol/motrin for pain. Smoking cessation advised.   I personally performed the services described in this documentation, which was scribed in  my presence. The recorded information has been reviewed and is accurate.   Final Clinical Impressions(s) / ED Diagnoses   Final diagnoses:  Pain due to dental caries  Infected dental caries  Tobacco use    New Prescriptions New Prescriptions   PENICILLIN V POTASSIUM (VEETID) 500 MG TABLET    Take 2 tablets (1,000 mg total) by mouth 2 (two) times daily. X 7 days     [address] PA-C 12/19/16 0128    April Palumbo, MD 12/19/16 [phone]

## 2016-12-19 NOTE — Discharge Instructions (Signed)
Apply warm compresses to jaw throughout the day. Take antibiotic until finished. Use tylenol or motrin as needed for pain. Perform salt water swishes to help with pain/swelling. Use over the counter oragel as needed for additional relief. STOP SMOKING! Followup with a dentist is very important for ongoing evaluation and management of recurrent dental pain, call the dentist listed above in the next 24-48 hours to schedule ongoing dental care, or use the list below to find a dentist. Return to emergency department for emergent changing or worsening symptoms.

## 2016-12-19 NOTE — ED Triage Notes (Signed)
Patient is experiencing dental pain from the right lower jaw. She reports this has been going for a while but she had a temporary filling in the tooth. Pt has took TYLENOL and IBUPROFEN for the pain.

## 2017-01-08 ENCOUNTER — Encounter (HOSPITAL_COMMUNITY): Payer: Self-pay | Admitting: *Deleted

## 2017-01-08 DIAGNOSIS — R1032 Left lower quadrant pain: Secondary | ICD-10-CM | POA: Insufficient documentation

## 2017-01-08 DIAGNOSIS — F17210 Nicotine dependence, cigarettes, uncomplicated: Secondary | ICD-10-CM | POA: Insufficient documentation

## 2017-01-08 LAB — CBC
HCT: 38.8 % (ref 36.0–46.0)
Hemoglobin: 13 g/dL (ref 12.0–15.0)
MCH: 29.6 pg (ref 26.0–34.0)
MCHC: 33.5 g/dL (ref 30.0–36.0)
MCV: 88.4 fL (ref 78.0–100.0)
PLATELETS: 263 10*3/uL (ref 150–400)
RBC: 4.39 MIL/uL (ref 3.87–5.11)
RDW: 12.3 % (ref 11.5–15.5)
WBC: 7.4 10*3/uL (ref 4.0–10.5)

## 2017-01-08 LAB — COMPREHENSIVE METABOLIC PANEL
ALT: 23 U/L (ref 14–54)
AST: 25 U/L (ref 15–41)
Albumin: 3.9 g/dL (ref 3.5–5.0)
Alkaline Phosphatase: 75 U/L (ref 38–126)
Anion gap: 8 (ref 5–15)
BUN: 5 mg/dL — AB (ref 6–20)
CHLORIDE: 100 mmol/L — AB (ref 101–111)
CO2: 28 mmol/L (ref 22–32)
CREATININE: 0.91 mg/dL (ref 0.44–1.00)
Calcium: 9.7 mg/dL (ref 8.9–10.3)
GFR calc Af Amer: >60 mL/min (ref >60)
Glucose, Bld: 119 mg/dL — ABNORMAL HIGH (ref 65–99)
Potassium: 4 mmol/L (ref 3.5–5.1)
Sodium: 136 mmol/L (ref 135–145)
Total Bilirubin: 1 mg/dL (ref 0.3–1.2)
Total Protein: 8.4 g/dL — ABNORMAL HIGH (ref 6.5–8.1)

## 2017-01-08 LAB — LIPASE, BLOOD: LIPASE: 24 U/L (ref 11–51)

## 2017-01-08 NOTE — ED Notes (Signed)
Pt sitting in waiting room eating [employer] fried chicken and drinking tea

## 2017-01-08 NOTE — ED Triage Notes (Signed)
The pt is c/o abd pain with n v and diarrhea since this am  lmp none

## 2017-01-09 ENCOUNTER — Emergency Department (HOSPITAL_COMMUNITY): Payer: Self-pay

## 2017-01-09 ENCOUNTER — Emergency Department (HOSPITAL_COMMUNITY)
Admission: EM | Admit: 2017-01-09 | Discharge: 2017-01-09 | Disposition: A | Discharge status: Home / Self Care | Payer: Self-pay | Attending: Emergency Medicine | Admitting: Emergency Medicine

## 2017-01-09 DIAGNOSIS — R1032 Left lower quadrant pain: Secondary | ICD-10-CM

## 2017-01-09 DIAGNOSIS — R197 Diarrhea, unspecified: Secondary | ICD-10-CM

## 2017-01-09 LAB — URINALYSIS, ROUTINE W REFLEX MICROSCOPIC
Bilirubin Urine: NEGATIVE
Glucose, UA: NEGATIVE mg/dL
Hgb urine dipstick: NEGATIVE
KETONES UR: NEGATIVE mg/dL
LEUKOCYTES UA: NEGATIVE
NITRITE: NEGATIVE
PROTEIN: NEGATIVE mg/dL
Specific Gravity, Urine: 1.013 (ref 1.005–1.030)
pH: 6 (ref 5.0–8.0)

## 2017-01-09 LAB — GASTROINTESTINAL PANEL BY PCR, STOOL (REPLACES STOOL CULTURE)

## 2017-01-09 LAB — PREGNANCY, URINE: PREG TEST UR: NEGATIVE

## 2017-01-09 MED ORDER — IOPAMIDOL (ISOVUE-300) INJECTION 61%
INTRAVENOUS | Status: AC
  Administered 2017-01-09: 100 mL
  Filled 2017-01-09: qty 100

## 2017-01-09 NOTE — ED Provider Notes (Signed)
[facility]EMERGENCY DEPT Provider Note   CSN: [id number] Arrival date & time: 01/08/17  2151     History   Chief Complaint Chief Complaint  Patient presents with  . Abdominal Pain    HPI Stephanie Reid is a 26 y.o. female with a hx of amenorrhea presents to the Emergency Department complaining of gradual, persistent, progressively worsening nausea and diarrhea onset this morning.  Pt reports 8 episodes of watery diarrhea.  No melena or hematochezia.  Associated symptoms include LLQ abd cramping which radiates to the low back.  Nothing makes it better and eating makes it worse.  Pt was able to eat [employer] without difficulty in the waiting room. Pt denies fever, chills, CP, SOB, vomiting, weakness, dizziness, syncope.  No urinary or vaginal symptoms.  Pt is sexually active With 1 female partner in the last 6 months. She reports last sexual encounter was 3 months ago and she has been tested for STDs since that time and was found to be negative.     The history is provided by the patient and medical records. No language interpreter was used.    History reviewed. No pertinent past medical history.  Patient Active Problem List   Diagnosis Date Noted  . Amenorrhea, secondary 10/06/2013    History reviewed. No pertinent surgical history.  OB History    No data available       Home Medications    Prior to Admission medications   Not on File    Family History Family History  Problem Relation Age of Onset  . Hypertension Mother   . Heart disease Mother   . Stroke Mother   . Diabetes Maternal Grandmother     Social History Social History  Substance Use Topics  . Smoking status: Current Every Day Smoker    Packs/day: 0.10    Types: Cigarettes  . Smokeless tobacco: Never Used  . Alcohol use Yes     Comment: Once a month     Allergies   Apple; Banana; Orange fruit [citrus]; Lactase-lactobacillus; Spinach; and Vicodin [hydrocodone-acetaminophen]   Review of  Systems Review of Systems  Constitutional: Negative for chills and fever.  HENT: Negative for congestion.   Respiratory: Negative for chest tightness.   Cardiovascular: Negative for chest pain.  Gastrointestinal: Positive for abdominal pain, diarrhea and nausea. Negative for vomiting.  Musculoskeletal: Positive for back pain.  All other systems reviewed and are negative.    Physical Exam Updated Vital Signs BP 116/83 (BP Location: Left Arm)   Pulse 100   Temp 99.8 F (37.7 C) (Oral)   Resp 12   SpO2 100%   Physical Exam  Constitutional: She appears well-developed and well-nourished. No distress.  Awake, alert, nontoxic appearance  HENT:  Head: Normocephalic and atraumatic.  Mouth/Throat: Oropharynx is clear and moist. No oropharyngeal exudate.  Eyes: Conjunctivae are normal. No scleral icterus.  Neck: Normal range of motion. Neck supple.  Full ROM without pain  Cardiovascular: Normal rate, regular rhythm and intact distal pulses.   Pulmonary/Chest: Effort normal and breath sounds normal. No respiratory distress. She has no wheezes.  Equal chest expansion  Abdominal: Soft. Bowel sounds are normal. She exhibits no distension and no mass. There is tenderness in the suprapubic area and left lower quadrant. There is no rebound, no guarding and no CVA tenderness.  Musculoskeletal: Normal range of motion. She exhibits no edema.  Full range of motion of the T-spine and L-spine No midline tenderness to the  T-spine or L-spine No  tenderness to palpation of the paraspinous muscles of the L-spine  Lymphadenopathy:    She has no cervical adenopathy.  Neurological: She is alert. She has normal reflexes.  Reflex Scores:      Bicep reflexes are 2+ on the right side and 2+ on the left side.      Brachioradialis reflexes are 2+ on the right side and 2+ on the left side.      Patellar reflexes are 2+ on the right side and 2+ on the left side.      Achilles reflexes are 2+ on the right side  and 2+ on the left side. Speech is clear and goal oriented, follows commands Normal 5/5 strength in upper and lower extremities bilaterally including dorsiflexion and plantar flexion, strong and equal grip strength Sensation normal to light and sharp touch Moves extremities without ataxia, coordination intact Normal gait Normal balance No Clonus  Skin: Skin is warm and dry. No rash noted. She is not diaphoretic. No erythema.  Psychiatric: She has a normal mood and affect. Her behavior is normal.  Nursing note and vitals reviewed.    ED Treatments / Results  Labs (all labs ordered are listed, but only abnormal results are displayed) Labs Reviewed  COMPREHENSIVE METABOLIC PANEL - Abnormal; Notable for the following:       Result Value   Chloride 100 (*)    Glucose, Bld 119 (*)    BUN 5 (*)    Total Protein 8.4 (*)    All other components within normal limits  GASTROINTESTINAL PANEL BY PCR, STOOL (REPLACES STOOL CULTURE)  LIPASE, BLOOD  CBC  URINALYSIS, ROUTINE W REFLEX MICROSCOPIC  PREGNANCY, URINE     Radiology Ct Abdomen Pelvis W Contrast  Result Date: 01/09/2017 CLINICAL DATA:  Left lower quadrant abdominal pain and diarrhea. EXAM: CT ABDOMEN AND PELVIS WITH CONTRAST TECHNIQUE: Multidetector CT imaging of the abdomen and pelvis was performed using the standard protocol following bolus administration of intravenous contrast. CONTRAST:  100mL ISOVUE-300 IOPAMIDOL (ISOVUE-300) INJECTION 61% COMPARISON:  06/06/2009 FINDINGS: Lower chest: The lung bases are clear. Hepatobiliary: No focal liver abnormality is seen. No gallstones, gallbladder wall thickening, or biliary dilatation. Pancreas: Unremarkable. No pancreatic ductal dilatation or surrounding inflammatory changes. Spleen: Normal in size without focal abnormality. Adrenals/Urinary Tract: Adrenal glands are unremarkable. Kidneys are normal, without renal calculi, focal lesion, or hydronephrosis. Bladder is unremarkable.  Stomach/Bowel: Stomach is within normal limits. Appendix appears normal. No evidence of bowel wall thickening, distention, or inflammatory changes. Vascular/Lymphatic: No significant vascular findings are present. No enlarged abdominal or pelvic lymph nodes. Reproductive: Uterus and bilateral adnexa are unremarkable. Other: Postoperative changes in the anterior abdominal wall consistent with hernia repair. No residual or recurrent herniation. No free air or free fluid in the abdomen. Musculoskeletal: No acute or significant osseous findings. IMPRESSION: No acute process demonstrated in the abdomen or pelvis. No evidence of bowel obstruction or inflammation. Electronically Signed   By: Burman NievesWilliam  Stevens M.D.   On: 01/09/2017 03:52    Procedures Procedures (including critical care time)  Medications Ordered in ED Medications  iopamidol (ISOVUE-300) 61 % injection (100 mLs  Contrast Given 01/09/17 0333)     Initial Impression / Assessment and Plan / ED Course  I have reviewed the triage vital signs and the nursing notes.  Pertinent labs & imaging results that were available during my care of the patient were reviewed by me and considered in my medical decision making (see chart for details).  Patient presents with abdominal cramping and diarrhea. Pain radiates into her low back. No CVA tenderness. No evidence of UTI. Negative pregnancy test. No leukocytosis, elevation in AST ALT or elevation in lipase. Stool sample collected and gastrointestinal panel by PCR is pending. CT scan is without acute abnormality. Specifically no evidence of a bowel obstruction or diverticulitis. On repeat exam patient remains without a surgical abdomen. She's had no emesis here in the emergency department. Patient will be discharged home with symptomatic therapy. Discussed findings, conservative therapies. Patient states understanding and is in agreement with the plan.   Final Clinical Impressions(s) / ED Diagnoses    Final diagnoses:  Left lower quadrant pain  Diarrhea of presumed infectious origin    New Prescriptions There are no discharge medications for this patient.    Dahlia Client Chalese Peach, PA-C 01/09/17 1610    Devoria Albe, MD 01/09/17 [phone]

## 2017-01-09 NOTE — Discharge Instructions (Signed)
1. Medications: imodium for diarrhea if you are unable to tolerate it any more; usual home medications 2. Treatment: rest, drink plenty of fluids, advance diet slowly 3. Follow Up: Please followup with your primary doctor in 2 days for discussion of your diagnoses and further evaluation after today's visit; if you do not have a primary care doctor use the resource guide provided to find one; Please return to the ER for persistent vomiting, but he diarrhea, high fevers or worsening symptoms.  These follow-up with gastroenterology if your symptoms persist for greater than one week.

## 2017-01-09 NOTE — ED Notes (Signed)
Patient transported to CT 

## 2017-05-12 ENCOUNTER — Emergency Department (HOSPITAL_COMMUNITY)
Admission: EM | Admit: 2017-05-12 | Discharge: 2017-05-13 | Disposition: A | Discharge status: Home / Self Care | Payer: Self-pay | Attending: Emergency Medicine | Admitting: Emergency Medicine

## 2017-05-12 DIAGNOSIS — R1084 Generalized abdominal pain: Secondary | ICD-10-CM | POA: Insufficient documentation

## 2017-05-12 DIAGNOSIS — Z5321 Procedure and treatment not carried out due to patient leaving prior to being seen by health care provider: Secondary | ICD-10-CM | POA: Insufficient documentation

## 2017-05-13 ENCOUNTER — Encounter (HOSPITAL_COMMUNITY): Payer: Self-pay | Admitting: Emergency Medicine

## 2017-05-13 LAB — COMPREHENSIVE METABOLIC PANEL
ALT: 34 U/L (ref 14–54)
AST: 27 U/L (ref 15–41)
Albumin: 4.3 g/dL (ref 3.5–5.0)
Alkaline Phosphatase: 79 U/L (ref 38–126)
Anion gap: 7 (ref 5–15)
BUN: 6 mg/dL (ref 6–20)
CHLORIDE: 103 mmol/L (ref 101–111)
CO2: 29 mmol/L (ref 22–32)
CREATININE: 0.72 mg/dL (ref 0.44–1.00)
Calcium: 9.6 mg/dL (ref 8.9–10.3)
GFR calc Af Amer: >60 mL/min (ref >60)
GFR calc non Af Amer: >60 mL/min (ref >60)
Glucose, Bld: 113 mg/dL — ABNORMAL HIGH (ref 65–99)
POTASSIUM: 3.9 mmol/L (ref 3.5–5.1)
Sodium: 139 mmol/L (ref 135–145)
Total Bilirubin: 0.7 mg/dL (ref 0.3–1.2)
Total Protein: 8.4 g/dL — ABNORMAL HIGH (ref 6.5–8.1)

## 2017-05-13 LAB — LIPASE, BLOOD: LIPASE: 24 U/L (ref 11–51)

## 2017-05-13 LAB — CBC
HEMATOCRIT: 37.6 % (ref 36.0–46.0)
HEMOGLOBIN: 12.9 g/dL (ref 12.0–15.0)
MCH: 29.3 pg (ref 26.0–34.0)
MCHC: 34.3 g/dL (ref 30.0–36.0)
MCV: 85.5 fL (ref 78.0–100.0)
Platelets: 268 10*3/uL (ref 150–400)
RBC: 4.4 MIL/uL (ref 3.87–5.11)
RDW: 12.4 % (ref 11.5–15.5)
WBC: 12.5 10*3/uL — AB (ref 4.0–10.5)

## 2017-05-13 NOTE — ED Triage Notes (Signed)
Pt from home with c/o generalized abdominal pain that she rates 10/10. Pt denies diarrhea or urinary symptoms but states she has had 2 episodes of emesis today. Pt states she initially checked in with a CC of SOB, but that was only because her abdomen was causing her so much pain, she states that is no longer a complaint.

## 2017-07-04 ENCOUNTER — Emergency Department (HOSPITAL_COMMUNITY)
Admission: EM | Admit: 2017-07-04 | Discharge: 2017-07-05 | Disposition: A | Discharge status: Home / Self Care | Payer: Self-pay | Attending: Emergency Medicine | Admitting: Emergency Medicine

## 2017-07-04 ENCOUNTER — Encounter (HOSPITAL_COMMUNITY): Payer: Self-pay | Admitting: Emergency Medicine

## 2017-07-04 DIAGNOSIS — Z885 Allergy status to narcotic agent status: Secondary | ICD-10-CM | POA: Insufficient documentation

## 2017-07-04 DIAGNOSIS — M7918 Myalgia, other site: Secondary | ICD-10-CM

## 2017-07-04 DIAGNOSIS — M79605 Pain in left leg: Secondary | ICD-10-CM | POA: Insufficient documentation

## 2017-07-04 DIAGNOSIS — M791 Myalgia: Secondary | ICD-10-CM | POA: Insufficient documentation

## 2017-07-04 DIAGNOSIS — F17210 Nicotine dependence, cigarettes, uncomplicated: Secondary | ICD-10-CM | POA: Insufficient documentation

## 2017-07-04 DIAGNOSIS — M79604 Pain in right leg: Secondary | ICD-10-CM | POA: Insufficient documentation

## 2017-07-04 DIAGNOSIS — R2243 Localized swelling, mass and lump, lower limb, bilateral: Secondary | ICD-10-CM | POA: Insufficient documentation

## 2017-07-04 LAB — CBC WITH DIFFERENTIAL/PLATELET
BASOS ABS: 0 10*3/uL (ref 0.0–0.1)
BASOS PCT: 0 %
EOS PCT: 6 %
Eosinophils Absolute: 0.4 10*3/uL (ref 0.0–0.7)
HCT: 36.3 % (ref 36.0–46.0)
Hemoglobin: 12 g/dL (ref 12.0–15.0)
LYMPHS PCT: 34 %
Lymphs Abs: 1.9 10*3/uL (ref 0.7–4.0)
MCH: 28.9 pg (ref 26.0–34.0)
MCHC: 33.1 g/dL (ref 30.0–36.0)
MCV: 87.5 fL (ref 78.0–100.0)
MONO ABS: 0.4 10*3/uL (ref 0.1–1.0)
Monocytes Relative: 6 %
Neutro Abs: 3 10*3/uL (ref 1.7–7.7)
Neutrophils Relative %: 54 %
PLATELETS: 228 10*3/uL (ref 150–400)
RBC: 4.15 MIL/uL (ref 3.87–5.11)
RDW: 13 % (ref 11.5–15.5)
WBC: 5.6 10*3/uL (ref 4.0–10.5)

## 2017-07-04 LAB — BASIC METABOLIC PANEL
ANION GAP: 8 (ref 5–15)
BUN: 5 mg/dL — ABNORMAL LOW (ref 6–20)
CALCIUM: 9.1 mg/dL (ref 8.9–10.3)
CO2: 25 mmol/L (ref 22–32)
Chloride: 105 mmol/L (ref 101–111)
Creatinine, Ser: 0.69 mg/dL (ref 0.44–1.00)
Glucose, Bld: 112 mg/dL — ABNORMAL HIGH (ref 65–99)
POTASSIUM: 3.6 mmol/L (ref 3.5–5.1)
SODIUM: 138 mmol/L (ref 135–145)

## 2017-07-04 MED ORDER — OXYCODONE-ACETAMINOPHEN 5-325 MG PO TABS
ORAL_TABLET | ORAL | Status: AC
  Filled 2017-07-04: qty 1

## 2017-07-04 MED ORDER — OXYCODONE-ACETAMINOPHEN 5-325 MG PO TABS
1.0000 | ORAL_TABLET | Freq: Once | ORAL | Status: AC
  Administered 2017-07-04: 1 via ORAL

## 2017-07-04 NOTE — ED Notes (Signed)
Pt reports pain and swelling to her feet bilaterally. Pt also reports pain to her knees.

## 2017-07-04 NOTE — ED Triage Notes (Signed)
Patient reports bilateral knee pain and feet swelling onset 3 days ago , denies injury or fever / ambulatory .

## 2017-07-04 NOTE — ED Provider Notes (Signed)
[facility] Provider Note   CSN: [id number] Arrival date & time: 07/04/17  2016     History   Chief Complaint Chief Complaint  Patient presents with  . Knee Pain    Feet Swelling    HPI Stephanie Reid is a 26 y.o. female.  HPI  26 y.o. female presents to the Emergency Department today due to bilateral knee and feet swelling x 3 days. Pt states that she is on her feet all day at work around 10-12 hours. Pt also works another job that requires the same. No chance to rest and elevated legs. States there is swelling usually at the end of the day, but resolves when she goes to bed and is able to elevate legs. Denies pain currently. No N/V. No CP/SOB. No trauma to area. No falls. No meds PTA or attempted. No fevers. No redness. No other symptoms noted.     History reviewed. No pertinent past medical history.  Patient Active Problem List   Diagnosis Date Noted  . Amenorrhea, secondary 10/06/2013    History reviewed. No pertinent surgical history.  OB History    No data available       Home Medications    Prior to Admission medications   Not on File    Family History Family History  Problem Relation Age of Onset  . Hypertension Mother   . Heart disease Mother   . Stroke Mother   . Diabetes Maternal Grandmother     Social History Social History  Substance Use Topics  . Smoking status: Current Every Day Smoker    Packs/day: 0.10    Types: Cigarettes  . Smokeless tobacco: Never Used  . Alcohol use Yes     Comment: Once a month     Allergies   Apple; Banana; Orange fruit [citrus]; Lactase-lactobacillus; Spinach; and Vicodin [hydrocodone-acetaminophen]   Review of Systems Review of Systems ROS reviewed and all are negative for acute change except as noted in the HPI.  Physical Exam Updated Vital Signs BP 125/80 (BP Location: Left Arm)   Pulse 83   Temp 97.6 F (36.4 C) (Oral)   Resp 18   Ht 5\' 6"  (1.676 m)   Wt 93 kg (205 lb)   SpO2 96%    BMI 33.09 kg/m   Physical Exam  Constitutional: She is oriented to person, place, and time. Vital signs are normal. She appears well-developed and well-nourished.  HENT:  Head: Normocephalic and atraumatic.  Right Ear: Hearing normal.  Left Ear: Hearing normal.  Eyes: Pupils are equal, round, and reactive to light. Conjunctivae and EOM are normal.  Neck: Normal range of motion. Neck supple.  Cardiovascular: Normal rate, regular rhythm, normal heart sounds and intact distal pulses.   Pulmonary/Chest: Effort normal and breath sounds normal.  Musculoskeletal:  BLE Knee  Negative anterior/poster drawer bilaterally. Negative ballottement test. No varus or valgus laxity. No crepitus. No pain with flexion or extension. No TTP of knees or ankles. No appreciable swelling. No erythema. NVI. Distal pulses appreciated.    Neurological: She is alert and oriented to person, place, and time.  Skin: Skin is warm and dry.  Psychiatric: She has a normal mood and affect. Her speech is normal and behavior is normal. Thought content normal.  Nursing note and vitals reviewed.  ED Treatments / Results  Labs (all labs ordered are listed, but only abnormal results are displayed) Labs Reviewed  BASIC METABOLIC PANEL - Abnormal; Notable for the following:  Result Value   Glucose, Bld 112 (*)    BUN 5 (*)    All other components within normal limits  CBC WITH DIFFERENTIAL/PLATELET    EKG  EKG Interpretation None       Radiology No results found.  Procedures Procedures (including critical care time)  Medications Ordered in ED Medications  ibuprofen (ADVIL,MOTRIN) tablet 600 mg (not administered)  methocarbamol (ROBAXIN) tablet 500 mg (not administered)  oxyCODONE-acetaminophen (PERCOCET/ROXICET) 5-325 MG per tablet 1 tablet (1 tablet Oral Given 07/04/17 2055)     Initial Impression / Assessment and Plan / ED Course  I have reviewed the triage vital signs and the nursing  notes.  Pertinent labs & imaging results that were available during my care of the patient were reviewed by me and considered in my medical decision making (see chart for details).  Final Clinical Impressions(s) / ED Diagnoses  {I have reviewed and evaluated the relevant laboratory values.   {I have reviewed the relevant previous healthcare records.  {I obtained HPI from historian.   ED Course:  Assessment: Pt is a 26 y.o. female presents to the Emergency Department today due to bilateral knee and feet swelling x 3 days. Pt states that she is on her feet all day at work around 10-12 hours. Pt also works another job that requires the same. No chance to rest and elevated legs. States there is swelling usually at the end of the day, but resolves when she goes to bed and is able to elevate legs. Denies pain currently. No N/V. No CP/SOB. No trauma to area. No falls. No meds PTA or attempted. No fevers. No redness. On exam, pt in NAD. Nontoxic/nonseptic appearing. VSS. Afebrile. Lungs CTA. Heart RRR. Negative anterior/poster drawer bilaterally. Negative ballottement test. No varus or valgus laxity. No crepitus. No pain with flexion or extension. No TTP of knees or ankles. No appreciable swelling. No erythema. NVI. Distal pulses appreciated. Labs unremarkable. Suspect this is related to prolonged standing at work. No swelling or pitting edema. No signs of infection. Likely musculoskeletal. Plan is to DC Home with follow up to PCP. At time of discharge, Patient is in no acute distress. Vital Signs are stable. Patient is able to ambulate. Patient able to tolerate PO.   Disposition/Plan:  DC Home Additional Verbal discharge instructions given and discussed with patient.  Pt Instructed to f/u with PCP in the next week for evaluation and treatment of symptoms. Return precautions given Pt acknowledges and agrees with plan  Supervising Physician Devoria Albe, MD   Final diagnoses:  Bilateral leg pain   Musculoskeletal pain    New Prescriptions New Prescriptions   No medications on file     Audry Pili, Cordelia Poche 07/05/17 Manus Gunning, MD 07/05/17 [phone]

## 2017-07-05 MED ORDER — METHOCARBAMOL 500 MG PO TABS
500.0000 mg | ORAL_TABLET | Freq: Once | ORAL | Status: DC
  Filled 2017-07-05: qty 1

## 2017-07-05 MED ORDER — IBUPROFEN 400 MG PO TABS
600.0000 mg | ORAL_TABLET | Freq: Once | ORAL | Status: AC
  Administered 2017-07-05: 01:00:00 600 mg via ORAL
  Filled 2017-07-05: qty 1

## 2017-07-05 MED ORDER — IBUPROFEN 600 MG PO TABS: 600.0000 mg | ORAL_TABLET | Freq: Four times a day (QID) | ORAL | 0 refills | Status: DC | PRN

## 2017-07-05 MED ORDER — METHOCARBAMOL 500 MG PO TABS: 500.0000 mg | ORAL_TABLET | Freq: Two times a day (BID) | ORAL | 0 refills | Status: DC

## 2017-07-05 NOTE — Discharge Instructions (Signed)
Please read and follow all provided instructions.  Your diagnoses today include:  1. Bilateral leg pain   2. Musculoskeletal pain     Tests performed today include: Vital signs. See below for your results today.   Medications prescribed:  Take as prescribed   Home care instructions:  Follow any educational materials contained in this packet.  Follow-up instructions: Please follow-up with your primary care provider for further evaluation of symptoms and treatment   Return instructions:  Please return to the Emergency Department if you do not get better, if you get worse, or new symptoms OR  - Fever (temperature greater than 101.7F)  - Bleeding that does not stop with holding pressure to the area    -Severe pain (please note that you may be more sore the day after your accident)  - Chest Pain  - Difficulty breathing  - Severe nausea or vomiting  - Inability to tolerate food and liquids  - Passing out  - Skin becoming red around your wounds  - Change in mental status (confusion or lethargy)  - New numbness or weakness    Please return if you have any other emergent concerns.  Additional Information:  Your vital signs today were: BP 119/77    Pulse 79    Temp 97.6 F (36.4 C) (Oral)    Resp 18    Ht 5\' 6"  (1.676 m)    Wt 93 kg (205 lb)    SpO2 97%    BMI 33.09 kg/m  If your blood pressure (BP) was elevated above 135/85 this visit, please have this repeated by your doctor within one month. ---------------

## 2017-10-02 ENCOUNTER — Encounter (HOSPITAL_COMMUNITY): Payer: Self-pay

## 2017-10-02 ENCOUNTER — Emergency Department (HOSPITAL_COMMUNITY)
Admission: EM | Admit: 2017-10-02 | Discharge: 2017-10-03 | Disposition: A | Discharge status: Home / Self Care | Payer: Self-pay | Attending: Emergency Medicine | Admitting: Emergency Medicine

## 2017-10-02 DIAGNOSIS — H9202 Otalgia, left ear: Secondary | ICD-10-CM | POA: Insufficient documentation

## 2017-10-02 DIAGNOSIS — Z113 Encounter for screening for infections with a predominantly sexual mode of transmission: Secondary | ICD-10-CM | POA: Insufficient documentation

## 2017-10-02 DIAGNOSIS — F17210 Nicotine dependence, cigarettes, uncomplicated: Secondary | ICD-10-CM | POA: Insufficient documentation

## 2017-10-02 DIAGNOSIS — J029 Acute pharyngitis, unspecified: Secondary | ICD-10-CM | POA: Insufficient documentation

## 2017-10-02 DIAGNOSIS — Z79899 Other long term (current) drug therapy: Secondary | ICD-10-CM | POA: Insufficient documentation

## 2017-10-02 NOTE — ED Triage Notes (Signed)
Pt states that for the past four days she has lost her voice, and sore throat after kissing someone, no fevers. No redness or swelling noted. Pt states that the pain is making her left ear hurt

## 2017-10-03 LAB — GC/CHLAMYDIA PROBE AMP (~~LOC~~) NOT AT ARMC
CHLAMYDIA, DNA PROBE: NEGATIVE
NEISSERIA GONORRHEA: POSITIVE — AB

## 2017-10-03 LAB — RAPID STREP SCREEN (MED CTR MEBANE ONLY): STREPTOCOCCUS, GROUP A SCREEN (DIRECT): NEGATIVE

## 2017-10-03 MED ORDER — LIDOCAINE VISCOUS 2 % MT SOLN: 15.0000 mL | OROMUCOSAL | 0 refills | Status: DC | PRN

## 2017-10-03 MED ORDER — IBUPROFEN 400 MG PO TABS
600.0000 mg | ORAL_TABLET | Freq: Once | ORAL | Status: AC
  Administered 2017-10-03: 02:00:00 600 mg via ORAL
  Filled 2017-10-03: qty 1

## 2017-10-03 NOTE — ED Provider Notes (Signed)
[facility]   CSN: [id number] Arrival date & time: 10/02/17  2247     History   Chief Complaint Chief Complaint  Patient presents with  . Sore Throat  . Otalgia    HPI Stephanie Reid is a 26 y.o. female.  HPI   Stephanie Reid is a 26yo female with no significant past medical history who presents the emergency department for evaluation of sore throat and left ear pain.  She states that she engaged in unprotected oral sex 4 days ago and has had sore throat ever since.  She is specifically requesting to be tested for chlamydia and gonorrhea in her mouth.  States that she has 10/10 severity constant "sharp" throat pain which radiates to her left ear.  Pain is worsened with speaking and swallowing.  She has tried over-the-counter throat lozenges and warm liquids without significant relief.  She also endorses hoarseness with her symptoms.  Denies exposure to recent sick contacts.  Denies fever, trismus, mouth lesions, otorrhea, tinnitus, hearing loss, shortness of breath, chest pain.  She is able to eat and drink although painful.  History reviewed. No pertinent past medical history.  Patient Active Problem List   Diagnosis Date Noted  . Amenorrhea, secondary 10/06/2013    History reviewed. No pertinent surgical history.  OB History    No data available       Home Medications    Prior to Admission medications   Medication Sig Start Date End Date Taking? Authorizing Provider  ibuprofen (ADVIL,MOTRIN) 600 MG tablet Take 1 tablet (600 mg total) by mouth every 6 (six) hours as needed. 07/05/17   Audry PiliMohr, Tyler, PA-C  lidocaine (XYLOCAINE) 2 % solution Use as directed 15 mLs as needed in the mouth or throat for mouth pain. Please swish and spit this medicine to help with throat pain. 10/03/17   Kellie ShropshireShrosbree, Emily J, PA-C  methocarbamol (ROBAXIN) 500 MG tablet Take 1 tablet (500 mg total) by mouth 2 (two) times daily. 07/05/17   Audry PiliMohr,  Tyler, PA-C    Family History Family History  Problem Relation Age of Onset  . Hypertension Mother   . Heart disease Mother   . Stroke Mother   . Diabetes Maternal Grandmother     Social History Social History   Tobacco Use  . Smoking status: Current Every Day Smoker    Packs/day: 0.10    Types: Cigarettes  . Smokeless tobacco: Never Used  Substance Use Topics  . Alcohol use: Yes    Comment: Once a month  . Drug use: No     Allergies   Apple; Banana; Orange fruit [citrus]; Lactase-lactobacillus; Spinach; and Vicodin [hydrocodone-acetaminophen]   Review of Systems Review of Systems  Constitutional: Negative for chills, fatigue and fever.  HENT: Positive for ear pain and sore throat. Negative for congestion, ear discharge, hearing loss, mouth sores, tinnitus and trouble swallowing.   Respiratory: Negative for shortness of breath.   Cardiovascular: Negative for chest pain.     Physical Exam Updated Vital Signs BP 113/73   Pulse 73   Temp 98.7 F (37.1 C)   Resp 18   SpO2 100%   Physical Exam  Constitutional: She appears well-developed and well-nourished. No distress.  HENT:  Head: Normocephalic and atraumatic.  Right Ear: Hearing normal.  Left Ear: Hearing normal.  Mouth/Throat: No tonsillar abscesses.  No mastoid tenderness or erythema bilaterally.  Bilateral TMs with good cone of light.  Mucous membranes moist.  Posterior  oropharynx mildly erythematous, no tonsillar exudate or swelling.  Uvula midline.  No trismus.  Able to handle oral secretions.  Eyes: Pupils are equal, round, and reactive to light. Right eye exhibits no discharge. Left eye exhibits no discharge.  Neck: Normal range of motion. Neck supple.  Mildly painful anterior cervical adenopathy.  Cardiovascular: Normal rate and regular rhythm.  No murmur heard. Pulmonary/Chest: Effort normal. No respiratory distress. She has no wheezes. She has no rhonchi. She has no rales.  Neurological: She is  alert. Coordination normal.  Skin: She is not diaphoretic.  Psychiatric: She has a normal mood and affect. Her behavior is normal.  Nursing note and vitals reviewed.    ED Treatments / Results  Labs (all labs ordered are listed, but only abnormal results are displayed) Labs Reviewed  RAPID STREP SCREEN (NOT AT [facility])  CULTURE, GROUP A STREP ([facility])  GC/CHLAMYDIA PROBE AMP ([place]) NOT AT [facility]    EKG  EKG Interpretation None       Radiology No results found.  Procedures Procedures (including critical care time)  Medications Ordered in ED Medications  ibuprofen (ADVIL,MOTRIN) tablet 600 mg (600 mg Oral Given 10/03/17 0145)     Initial Impression / Assessment and Plan / ED Course  I have reviewed the triage vital signs and the nursing notes.  Pertinent labs & imaging results that were available during my care of the patient were reviewed by me and considered in my medical decision making (see chart for details).    Pt afebrile without tonsillar exudate, negative strep. Presents with mild cervical lymphadenopathy, & odynophagia; diagnosis of viral pharyngitis. Patient also requesting chlamydia and gonorrhea testing in the mouth due to recent unprotected oral sexual activity.  Have counseled her that she will get a call with these results if they are positive in the next several days.  Discussed that she will need to come back and be treated with antibiotics if her testing is positive.  No signs of ear infection on exam.  No abx indicated at this time. Discharged with symptomatic tx for pain.  Pt does not appear dehydrated, but did discuss importance of water rehydration. Presentation non concerning for PTA or RPA. No trismus or uvula deviation. Specific return precautions discussed. Pt able to drink water in ED without difficulty with intact air way. Recommended PCP follow up.  Final Clinical Impressions(s) / ED Diagnoses   Final diagnoses:  Viral pharyngitis    ED  Discharge Orders        Ordered    lidocaine (XYLOCAINE) 2 % solution  As needed     10/03/17 0148       Kellie ShropshireShrosbree, Emily J, PA-C 10/03/17 0148    Mancel BaleWentz, Elliott, MD 10/11/17 [phone]

## 2017-10-03 NOTE — Discharge Instructions (Signed)
Your strep throat test was negative.  Your exam is consistent with viral throat pain.    You have chlamydia and gonorrhea swabs pending.  It takes about 3 days for these results to return.  You will get a phone call if you tested positive for chlamydia or gonorrhea.  You will not get a phone call if these results are negative.  If you are positive for chlamydia or gonorrhea, you will need to return to the urgent care, ER or health department to get antibiotics.  Please use ibuprofen 600 mg every 6 hours as needed for throat pain.  Also use warm liquids, over-the-counter throat lozenges, salt water gargles which can help symptomatically relieve the pain.   I have written you a prescription for lidocaine swish and spit medicine.  This can also help numb the back of the mouth to help with symptoms.  Return to the emergency department if you have throat pain in which it is difficult to breathe, throat pain in which you can no longer swallow or have any new or worsening symptoms.

## 2017-10-05 LAB — CULTURE, GROUP A STREP (THRC)

## 2017-10-20 ENCOUNTER — Other Ambulatory Visit: Payer: Self-pay

## 2017-10-20 ENCOUNTER — Encounter (HOSPITAL_COMMUNITY): Payer: Self-pay | Admitting: Emergency Medicine

## 2017-10-20 DIAGNOSIS — Z79899 Other long term (current) drug therapy: Secondary | ICD-10-CM | POA: Insufficient documentation

## 2017-10-20 DIAGNOSIS — F17210 Nicotine dependence, cigarettes, uncomplicated: Secondary | ICD-10-CM | POA: Insufficient documentation

## 2017-10-20 DIAGNOSIS — J060 Acute laryngopharyngitis: Secondary | ICD-10-CM | POA: Insufficient documentation

## 2017-10-20 LAB — RAPID STREP SCREEN (MED CTR MEBANE ONLY): STREPTOCOCCUS, GROUP A SCREEN (DIRECT): NEGATIVE

## 2017-10-20 NOTE — ED Triage Notes (Signed)
Pt reports having sore throat and body aches for the last 2 weeks. Pt states she had been seen for similar symptoms and they went away then returned. Denies taking any OTC medications.

## 2017-10-21 ENCOUNTER — Emergency Department (HOSPITAL_COMMUNITY)
Admission: EM | Admit: 2017-10-21 | Discharge: 2017-10-21 | Disposition: A | Discharge status: Home / Self Care | Payer: Self-pay | Attending: Emergency Medicine | Admitting: Emergency Medicine

## 2017-10-21 DIAGNOSIS — J060 Acute laryngopharyngitis: Secondary | ICD-10-CM

## 2017-10-21 MED ORDER — AZITHROMYCIN 250 MG PO TABS: 250.0000 mg | ORAL_TABLET | Freq: Every day | ORAL | 0 refills | Status: DC

## 2017-10-21 NOTE — ED Provider Notes (Signed)
[facility]-EMERGENCY DEPT Provider Note   CSN: [id number] Arrival date & time: 10/20/17  2159     History   Chief Complaint Chief Complaint  Patient presents with  . Sore Throat  . Generalized Body Aches    HPI Stephanie Reid is a 26 y.o. female.  HPI Patient is a 26 year old female presents to the emergency department sore throat and body aches over the past 2 weeks but worse over the past 3-4 days.  She reports painful swallowing.  History of strep throat.  Denies neck pain.  She reports productive cough.  Reports nasal congestion.  Low-grade fevers.  No significant shortness of breath with exertion.  Symptoms are moderate in severity   History reviewed. No pertinent past medical history.  Patient Active Problem List   Diagnosis Date Noted  . Amenorrhea, secondary 10/06/2013    History reviewed. No pertinent surgical history.  OB History    No data available       Home Medications    Prior to Admission medications   Medication Sig Start Date End Date Taking? Authorizing Provider  azithromycin (ZITHROMAX Z-PAK) 250 MG tablet Take 1 tablet (250 mg total) by mouth daily. Take 2 tabs for first dose, then 1 tab for each additional dose 10/21/17   Azalia Bilisampos, Koua Deeg, MD  ibuprofen (ADVIL,MOTRIN) 600 MG tablet Take 1 tablet (600 mg total) by mouth every 6 (six) hours as needed. 07/05/17   Audry PiliMohr, Tyler, PA-C  lidocaine (XYLOCAINE) 2 % solution Use as directed 15 mLs as needed in the mouth or throat for mouth pain. Please swish and spit this medicine to help with throat pain. 10/03/17   Kellie ShropshireShrosbree, Emily J, PA-C  methocarbamol (ROBAXIN) 500 MG tablet Take 1 tablet (500 mg total) by mouth 2 (two) times daily. 07/05/17   Audry PiliMohr, Tyler, PA-C    Family History Family History  Problem Relation Age of Onset  . Hypertension Mother   . Heart disease Mother   . Stroke Mother   . Diabetes Maternal Grandmother     Social History Social History   Tobacco Use    . Smoking status: Current Every Day Smoker    Packs/day: 0.10    Types: Cigarettes  . Smokeless tobacco: Never Used  Substance Use Topics  . Alcohol use: Yes    Comment: Once a month  . Drug use: No     Allergies   Apple; Banana; Orange fruit [citrus]; Lactase-lactobacillus; Spinach; and Vicodin [hydrocodone-acetaminophen]   Review of Systems Review of Systems  All other systems reviewed and are negative.    Physical Exam Updated Vital Signs BP 127/83 (BP Location: Right Arm)   Pulse 100   Temp 99.1 F (37.3 C) (Oral)   Resp 17   Ht 5\' 6"  (1.676 m)   Wt 93 kg (205 lb)   SpO2 99%   BMI 33.09 kg/m   Physical Exam  Constitutional: She is oriented to person, place, and time. She appears well-developed and well-nourished. No distress.  HENT:  Head: Normocephalic and atraumatic.  Erythema of the posterior pharynx.  Uvula midline.  No tonsillar swelling or exudate.  Tolerating secretions.  Oral airway patent.  Eyes: EOM are normal.  Neck: Normal range of motion. Neck supple.  No meningeal signs  Cardiovascular: Normal rate and regular rhythm.  Pulmonary/Chest: Effort normal.  Rhonchi left base  Abdominal: Soft. She exhibits no distension. There is no tenderness.  Musculoskeletal: Normal range of motion.  Neurological: She is alert and oriented  to person, place, and time.  Skin: Skin is warm and dry.  Psychiatric: She has a normal mood and affect. Judgment normal.  Nursing note and vitals reviewed.    ED Treatments / Results  Labs (all labs ordered are listed, but only abnormal results are displayed) Labs Reviewed  RAPID STREP SCREEN (NOT AT [facility])  CULTURE, GROUP A STREP [facility])    EKG  EKG Interpretation None       Radiology No results found.  Procedures Procedures (including critical care time)  Medications Ordered in ED Medications - No data to display   Initial Impression / Assessment and Plan / ED Course  I have reviewed the triage vital  signs and the nursing notes.  Pertinent labs & imaging results that were available during my care of the patient were reviewed by me and considered in my medical decision making (see chart for details).     Rhonchi noted in left base.  No hypoxia.  Patient be covered with azithromycin for possible developing pneumonia.  Much of her symptoms however are more consistent with a viral upper respiratory tract infection but the rhonchi is concerning enough to initiate antibiotic therapy at this time.  Patient understands to return to the ER for new or worsening symptoms  Final Clinical Impressions(s) / ED Diagnoses   Final diagnoses:  Acute laryngopharyngitis    ED Discharge Orders        Ordered    azithromycin (ZITHROMAX Z-PAK) 250 MG tablet  Daily     10/21/17 0226       Azalia Bilisampos, Johnnisha Forton, MD 10/21/17 [phone]

## 2017-10-22 ENCOUNTER — Other Ambulatory Visit: Payer: Self-pay

## 2017-10-22 ENCOUNTER — Encounter (HOSPITAL_COMMUNITY): Payer: Self-pay | Admitting: *Deleted

## 2017-10-22 DIAGNOSIS — Z79899 Other long term (current) drug therapy: Secondary | ICD-10-CM | POA: Insufficient documentation

## 2017-10-22 DIAGNOSIS — F17210 Nicotine dependence, cigarettes, uncomplicated: Secondary | ICD-10-CM | POA: Insufficient documentation

## 2017-10-22 DIAGNOSIS — J189 Pneumonia, unspecified organism: Secondary | ICD-10-CM | POA: Insufficient documentation

## 2017-10-22 NOTE — ED Triage Notes (Signed)
The pt is c/o a cold coough for 3-4 days she was seen at [facility] 2 days ago she was given antibiotics but only took 2 pills because it made her nauseated.  Aching all over her body headache head and chest congestion with a temp intermittently  lmp  none

## 2017-10-23 ENCOUNTER — Emergency Department (HOSPITAL_COMMUNITY): Payer: Self-pay

## 2017-10-23 ENCOUNTER — Emergency Department (HOSPITAL_COMMUNITY)
Admission: EM | Admit: 2017-10-23 | Discharge: 2017-10-23 | Disposition: A | Discharge status: Home / Self Care | Payer: Self-pay | Attending: Emergency Medicine | Admitting: Emergency Medicine

## 2017-10-23 DIAGNOSIS — J189 Pneumonia, unspecified organism: Secondary | ICD-10-CM

## 2017-10-23 DIAGNOSIS — R000 Tachycardia, unspecified: Secondary | ICD-10-CM

## 2017-10-23 LAB — CULTURE, GROUP A STREP (THRC)

## 2017-10-23 MED ORDER — KETOROLAC TROMETHAMINE 30 MG/ML IJ SOLN
30.0000 mg | Freq: Once | INTRAMUSCULAR | Status: AC
  Administered 2017-10-23: 30 mg via INTRAVENOUS
  Filled 2017-10-23: qty 1

## 2017-10-23 MED ORDER — ONDANSETRON HCL 4 MG/2ML IJ SOLN
4.0000 mg | Freq: Once | INTRAMUSCULAR | Status: AC
  Administered 2017-10-23: 4 mg via INTRAVENOUS
  Filled 2017-10-23: qty 2

## 2017-10-23 MED ORDER — ALBUTEROL SULFATE (2.5 MG/3ML) 0.083% IN NEBU
5.0000 mg | INHALATION_SOLUTION | Freq: Once | RESPIRATORY_TRACT | Status: AC
  Administered 2017-10-23: 5 mg via RESPIRATORY_TRACT
  Filled 2017-10-23: qty 6

## 2017-10-23 MED ORDER — AEROCHAMBER PLUS FLO-VU LARGE MISC
Status: AC
  Filled 2017-10-23: qty 1

## 2017-10-23 MED ORDER — CETIRIZINE HCL 10 MG PO TABS: 10.0000 mg | ORAL_TABLET | Freq: Every day | ORAL | 1 refills | Status: DC

## 2017-10-23 MED ORDER — AEROCHAMBER PLUS FLO-VU LARGE MISC
1.0000 | Freq: Once | Status: AC
  Administered 2017-10-23: 1

## 2017-10-23 MED ORDER — SODIUM CHLORIDE 0.9 % IV BOLUS (SEPSIS)
1000.0000 mL | Freq: Once | INTRAVENOUS | Status: AC
  Administered 2017-10-23: 1000 mL via INTRAVENOUS

## 2017-10-23 MED ORDER — MECLIZINE HCL 25 MG PO TABS: 25.0000 mg | ORAL_TABLET | Freq: Three times a day (TID) | ORAL | 0 refills | Status: DC | PRN

## 2017-10-23 MED ORDER — ALBUTEROL SULFATE HFA 108 (90 BASE) MCG/ACT IN AERS
2.0000 | INHALATION_SPRAY | RESPIRATORY_TRACT | Status: DC | PRN
  Administered 2017-10-23: 2 via RESPIRATORY_TRACT
  Filled 2017-10-23: qty 6.7

## 2017-10-23 MED ORDER — BENZONATATE 100 MG PO CAPS: 100.0000 mg | ORAL_CAPSULE | Freq: Three times a day (TID) | ORAL | 0 refills | Status: DC | PRN

## 2017-10-23 MED ORDER — MECLIZINE HCL 25 MG PO TABS
25.0000 mg | ORAL_TABLET | Freq: Once | ORAL | Status: AC
  Administered 2017-10-23: 25 mg via ORAL
  Filled 2017-10-23: qty 1

## 2017-10-23 MED ORDER — ONDANSETRON 4 MG PO TBDP: ORAL_TABLET | ORAL | 0 refills | Status: DC

## 2017-10-23 MED ORDER — NAPROXEN 250 MG PO TABS: 250.0000 mg | ORAL_TABLET | Freq: Two times a day (BID) | ORAL | 0 refills | Status: DC

## 2017-10-23 MED ORDER — FLUTICASONE PROPIONATE 50 MCG/ACT NA SUSP: 2.0000 | Freq: Every day | NASAL | 2 refills | Status: DC

## 2017-10-23 NOTE — ED Provider Notes (Signed)
Patient care assumed from [facility] Mut[facility]baugh, PA-C at shift change.  Please see her note for further. Briefly, patient presented with cough.  Patient clinically has community-acquired pneumonia on lung exam.  Her chest x-ray was unremarkable.  Plan is for reevaluation, fever control and repeat vitals and plan for discharge.  Upon my initial evaluation patient reports she is still feeling nauseated.  Her heart rate is improved.  On lung exam she has no wheezing.  Lungs are clear to auscultation.  Will provide with meclizine and attempt p.o. trial.  At reevaluation patient is tolerating p.o. Tachycardia has resolved.  She reports that her nausea has completely resolved after meclizine.  We will have her continue using azithromycin at home.  Will discharge with prescription for meclizine, naproxen and Zyrtec.  I discussed strict and specific return precautions. I advised the patient to follow-up with their primary care provider this week. I advised the patient to return to the emergency department with new or worsening symptoms or new concerns. The patient verbalized understanding and agreement with plan.   Community acquired pneumonia, unspecified laterality  Tachycardia       Everlene FarrierDansie, Blondell Laperle, PA-C 10/23/17 [phone]    Zadie RhineWickline, Donald, MD 10/24/17 [phone]

## 2017-10-23 NOTE — ED Provider Notes (Signed)
[facility]   CSN: [id number] Arrival date & time: 10/22/17  2045     History   Chief Complaint Chief Complaint  Patient presents with  . Cough    HPI Stephanie Reid is a 26 y.o. female with a hx of amenorrhea presents to the Emergency Department complaining of gradual, persistent, progressively worsening URI symptoms onset 4 days ago.  Pt reports objective fevers, body aches, cough, congestion, fatigue.  Patient denies known sick contacts.  She [facility] and given [facility] antibiotic.  Record review shows patient was prescribed azithromycin.  She reports only taking 2 doses of this because it is making her nauseated.  No vomiting or diarrhea.  No known sick contacts.  Patient did not receive a flu shot this year.  Nothing seems to make her symptoms better or worse.  Patient has taken DayQuil x1 without relief.   The history is provided by the patient and medical records. No language interpreter was used.    History reviewed. No pertinent past medical history.  Patient Active Problem List   Diagnosis Date Noted  . Amenorrhea, secondary 10/06/2013    History reviewed. No pertinent surgical history.  OB History    No data available       Home Medications    Prior to Admission medications   Medication Sig Start Date End Date Taking? Authorizing Provider  azithromycin (ZITHROMAX Z-PAK) 250 MG tablet Take 1 tablet (250 mg total) by mouth daily. Take 2 tabs for first dose, then 1 tab for each additional dose 10/21/17  Yes Azalia Bilisampos, Kevin, MD  Phenylephrine-DM-GG-APAP (DAYQUIL SEVERE + VAPOCOOL) 5-10-200-325 MG/15ML LIQD Take 15 mLs by mouth as needed (for cough).   Yes [provider]  benzonatate (TESSALON PERLES) 100 MG capsule Take 1 capsule (100 mg total) by mouth 3 (three) times daily as needed for cough (cough). 10/23/17   Amir Glaus, Dahlia ClientHannah, PA-C  fluticasone (FLONASE)  50 MCG/ACT nasal spray Place 2 sprays into both nostrils daily. 10/23/17   [place] Foerster, Dahlia ClientHannah, PA-C  ibuprofen (ADVIL,MOTRIN) 600 MG tablet Take 1 tablet (600 mg total) by mouth every 6 (six) hours as needed. Patient not taking: Reported on 10/23/2017 07/05/17   Audry PiliMohr, Tyler, PA-C  lidocaine (XYLOCAINE) 2 % solution Use as directed 15 mLs as needed in the mouth or throat for mouth pain. Please swish and spit this medicine to help with throat pain. Patient not taking: Reported on 10/23/2017 10/03/17   Kellie ShropshireShrosbree, Emily J, PA-C  methocarbamol (ROBAXIN) 500 MG tablet Take 1 tablet (500 mg total) by mouth 2 (two) times daily. Patient not taking: Reported on 10/23/2017 07/05/17   Audry PiliMohr, Tyler, PA-C  ondansetron [facility](ZOFRAN ODT) 4 MG disintegrating tablet 4mg  ODT with your azithromycin for nausea 10/23/17   Patt Steinhardt, Dahlia ClientHannah, PA-C    Family History Family History  Problem Relation Age of Onset  . Hypertension Mother   . Heart disease Mother   . Stroke Mother   . Diabetes Maternal Grandmother     Social History Social History   Tobacco Use  . Smoking status: Current Every Day Smoker    Packs/day: 0.10    Types: Cigarettes  . Smokeless tobacco: Never Used  Substance Use Topics  . Alcohol use: Yes    Comment: Once a month  . Drug use: No     Allergies   Apple; Banana; Orange fruit [citrus]; Lactase-lactobacillus; Spinach; and Vicodin [hydrocodone-acetaminophen]   Review of  Systems Review of Systems  Constitutional: Positive for fatigue and fever. Negative for appetite change and chills.  HENT: Positive for congestion, postnasal drip, rhinorrhea, sinus pressure and sore throat. Negative for ear discharge, ear pain and mouth sores.   Eyes: Negative for visual disturbance.  Respiratory: Positive for cough, chest tightness, shortness of breath and wheezing. Negative for stridor.   Cardiovascular: Negative for chest pain, palpitations and leg swelling.  Gastrointestinal: Negative for  abdominal pain, diarrhea, nausea and vomiting.  Genitourinary: Negative for dysuria, frequency, hematuria and urgency.  Musculoskeletal: Negative for arthralgias, back pain, myalgias and neck stiffness.  Skin: Negative for rash.  Neurological: Negative for syncope, light-headedness, numbness and headaches.  Hematological: Negative for adenopathy.  Psychiatric/Behavioral: The patient is not nervous/anxious.   All other systems reviewed and are negative.    Physical Exam Updated Vital Signs BP 120/82 (BP Location: Right Arm)   Pulse (!) 112   Temp 100.2 F (37.9 C) (Oral)   Resp 18   Ht 5\' 6"  (1.676 m)   Wt 94.8 kg (209 lb)   SpO2 99%   BMI 33.73 kg/m   Physical Exam  Constitutional: She appears well-developed and well-nourished. No distress.  HENT:  Head: Normocephalic and atraumatic.  Right Ear: Tympanic membrane, external ear and ear canal normal.  Left Ear: Tympanic membrane, external ear and ear canal normal.  Nose: Mucosal edema and rhinorrhea present. No epistaxis. Right sinus exhibits no maxillary sinus tenderness and no frontal sinus tenderness. Left sinus exhibits no maxillary sinus tenderness and no frontal sinus tenderness.  Mouth/Throat: Uvula is midline and mucous membranes are normal. Mucous membranes are not pale and not cyanotic. No oropharyngeal exudate, posterior oropharyngeal edema, posterior oropharyngeal erythema or tonsillar abscesses.  Eyes: Conjunctivae are normal. Pupils are equal, round, and reactive to light.  Neck: Normal range of motion and full passive range of motion without pain.  Cardiovascular: Intact distal pulses. Tachycardia present.  Pulses:      Radial pulses are 2+ on the right side, and 2+ on the left side.  Pulmonary/Chest: Effort normal. No stridor. Tachypnea noted. She has wheezes ( Fine, expiratory, throughout). She has rhonchi in the right middle field, the right lower field, the left middle field and the left lower field.  Clear and  equal breath sounds without focal wheezes, rhonchi, rales  Abdominal: Soft. There is no tenderness.  Musculoskeletal: Normal range of motion.  Lymphadenopathy:    She has no cervical adenopathy.  Neurological: She is alert.  Skin: Skin is warm and dry. No rash noted. She is not diaphoretic.  Psychiatric: She has a normal mood and affect.  Nursing note and vitals reviewed.    ED Treatments / Results   Radiology Dg Chest 2 View  Result Date: 10/23/2017 CLINICAL DATA:  Cough, congestion, fever at home. EXAM: CHEST  2 VIEW COMPARISON:  12/10/2015 FINDINGS: The cardiomediastinal contours are normal. The lungs are clear. Pulmonary vasculature is normal. No consolidation, pleural effusion, or pneumothorax. No acute osseous abnormalities are seen. IMPRESSION: Unremarkable radiographs of the chest. Electronically Signed   By: Rubye OaksMelanie  Ehinger M.D.   On: 10/23/2017 05:22    Procedures Procedures (including critical care time)  Medications Ordered in ED Medications  albuterol (PROVENTIL HFA;VENTOLIN HFA) 108 (90 Base) MCG/ACT inhaler 2 puff (not administered)  AEROCHAMBER PLUS FLO-VU LARGE MISC 1 each (not administered)  sodium chloride 0.9 % bolus 1,000 mL (1,000 mLs Intravenous New Bag/Given 10/23/17 0555)  ketorolac (TORADOL) 30 MG/ML injection 30 mg (30  mg Intravenous Given 10/23/17 0555)  albuterol (PROVENTIL) (2.5 MG/3ML) 0.083% nebulizer solution 5 mg (5 mg Nebulization Given 10/23/17 0550)  ondansetron (ZOFRAN) injection 4 mg (4 mg Intravenous Given 10/23/17 0555)     Initial Impression / Assessment and Plan / ED Course  I have reviewed the triage vital signs and the nursing notes.  Pertinent labs & imaging results that were available during my care of the patient were reviewed by me and considered in my medical decision making (see chart for details).     Patient presents with URI symptoms.  Suspect community-acquired pneumonia.  She is tachycardic and febrile.  Will give  fluids, antipyretics and obtain chest x-ray.  X-ray without acute abnormality however clinical exam points to community-acquired pneumonia.  Will have patient continue azithromycin.  Will give Zofran for her to take with this to help alleviate nausea.  Patient given albuterol and fluids here.  At shift change care was transferred to [facility], PA-C who will follow reassess vitals and determine disposition.    Final Clinical Impressions(s) / ED Diagnoses   Final diagnoses:  Community acquired pneumonia, unspecified laterality  Tachycardia    ED Discharge Orders        Ordered    ondansetron (ZOFRAN ODT) 4 MG disintegrating tablet     10/23/17 0558    fluticasone (FLONASE) 50 MCG/ACT nasal spray  Daily     10/23/17 0558    benzonatate (TESSALON PERLES) 100 MG capsule  3 times daily PRN     10/23/17 0558       Maleny Candy, Dahlia Client, PA-C 10/23/17 0604    Zadie Rhine, MD 10/23/17 [phone]

## 2017-10-23 NOTE — ED Notes (Signed)
See providers notes

## 2017-10-23 NOTE — Discharge Instructions (Addendum)
1. Medications: flonase, mucinex, tessalon, Albuterol, meclizine for nausea,  continue your azithromycin 2. Treatment: rest, drink plenty of fluids, take tylenol or ibuprofen for fever control 3. Follow Up: Please followup with your primary doctor in 3 days for discussion of your diagnoses and further evaluation after today's visit; if you do not have a primary care doctor use the resource guide provided to find one; Return to the ER for high fevers, difficulty breathing or other concerning symptoms

## 2017-12-04 ENCOUNTER — Emergency Department (HOSPITAL_COMMUNITY)
Admission: EM | Admit: 2017-12-04 | Discharge: 2017-12-04 | Disposition: A | Discharge status: Home / Self Care | Payer: Self-pay | Attending: Emergency Medicine | Admitting: Emergency Medicine

## 2017-12-04 ENCOUNTER — Encounter (HOSPITAL_COMMUNITY): Payer: Self-pay | Admitting: Emergency Medicine

## 2017-12-04 ENCOUNTER — Other Ambulatory Visit: Payer: Self-pay

## 2017-12-04 DIAGNOSIS — R1084 Generalized abdominal pain: Secondary | ICD-10-CM | POA: Insufficient documentation

## 2017-12-04 DIAGNOSIS — Z5321 Procedure and treatment not carried out due to patient leaving prior to being seen by health care provider: Secondary | ICD-10-CM | POA: Insufficient documentation

## 2017-12-04 LAB — I-STAT BETA HCG BLOOD, ED (MC, WL, AP ONLY)

## 2017-12-04 LAB — CBC
HEMATOCRIT: 44.1 % (ref 36.0–46.0)
HEMOGLOBIN: 14.5 g/dL (ref 12.0–15.0)
MCH: 29.2 pg (ref 26.0–34.0)
MCHC: 32.9 g/dL (ref 30.0–36.0)
MCV: 88.9 fL (ref 78.0–100.0)
Platelets: 245 10*3/uL (ref 150–400)
RBC: 4.96 MIL/uL (ref 3.87–5.11)
RDW: 12.7 % (ref 11.5–15.5)
WBC: 12.4 10*3/uL — ABNORMAL HIGH (ref 4.0–10.5)

## 2017-12-04 LAB — URINALYSIS, ROUTINE W REFLEX MICROSCOPIC
Bilirubin Urine: NEGATIVE
Glucose, UA: NEGATIVE mg/dL
Hgb urine dipstick: NEGATIVE
KETONES UR: NEGATIVE mg/dL
Nitrite: NEGATIVE
PROTEIN: NEGATIVE mg/dL
Specific Gravity, Urine: 1.024 (ref 1.005–1.030)
pH: 5 (ref 5.0–8.0)

## 2017-12-04 LAB — COMPREHENSIVE METABOLIC PANEL
ALBUMIN: 3.2 g/dL — AB (ref 3.5–5.0)
ALT: 17 U/L (ref 14–54)
ANION GAP: 5 (ref 5–15)
AST: 20 U/L (ref 15–41)
Alkaline Phosphatase: 50 U/L (ref 38–126)
BUN: <5 mg/dL — ABNORMAL LOW (ref 6–20)
CHLORIDE: 105 mmol/L (ref 101–111)
CO2: 29 mmol/L (ref 22–32)
Calcium: 8.6 mg/dL — ABNORMAL LOW (ref 8.9–10.3)
Creatinine, Ser: 0.74 mg/dL (ref 0.44–1.00)
GFR calc Af Amer: >60 mL/min (ref >60)
GFR calc non Af Amer: >60 mL/min (ref >60)
Glucose, Bld: 116 mg/dL — ABNORMAL HIGH (ref 65–99)
POTASSIUM: 3.9 mmol/L (ref 3.5–5.1)
SODIUM: 139 mmol/L (ref 135–145)
Total Bilirubin: 0.7 mg/dL (ref 0.3–1.2)
Total Protein: 6 g/dL — ABNORMAL LOW (ref 6.5–8.1)

## 2017-12-04 LAB — LIPASE, BLOOD: Lipase: 25 U/L (ref 11–51)

## 2017-12-04 MED ORDER — ONDANSETRON 4 MG PO TBDP
4.0000 mg | ORAL_TABLET | Freq: Once | ORAL | Status: AC
  Administered 2017-12-04: 4 mg via ORAL
  Filled 2017-12-04: qty 1

## 2017-12-04 NOTE — ED Triage Notes (Signed)
Pt arrives via POv from home with generalized abdominal pain. N/V/diarrhea. Denies recent fever or sick contact. VSS.

## 2017-12-04 NOTE — ED Notes (Signed)
Unable to locate x 1  

## 2017-12-22 ENCOUNTER — Emergency Department (HOSPITAL_COMMUNITY): Payer: Self-pay

## 2017-12-22 ENCOUNTER — Encounter (HOSPITAL_COMMUNITY): Payer: Self-pay | Admitting: Nurse Practitioner

## 2017-12-22 ENCOUNTER — Emergency Department (HOSPITAL_COMMUNITY)
Admission: EM | Admit: 2017-12-22 | Discharge: 2017-12-22 | Disposition: A | Discharge status: Home / Self Care | Payer: Self-pay | Attending: Emergency Medicine | Admitting: Emergency Medicine

## 2017-12-22 DIAGNOSIS — F17210 Nicotine dependence, cigarettes, uncomplicated: Secondary | ICD-10-CM | POA: Insufficient documentation

## 2017-12-22 DIAGNOSIS — E876 Hypokalemia: Secondary | ICD-10-CM | POA: Insufficient documentation

## 2017-12-22 DIAGNOSIS — J111 Influenza due to unidentified influenza virus with other respiratory manifestations: Secondary | ICD-10-CM

## 2017-12-22 DIAGNOSIS — Z79899 Other long term (current) drug therapy: Secondary | ICD-10-CM | POA: Insufficient documentation

## 2017-12-22 DIAGNOSIS — J101 Influenza due to other identified influenza virus with other respiratory manifestations: Secondary | ICD-10-CM | POA: Insufficient documentation

## 2017-12-22 LAB — CBC WITH DIFFERENTIAL/PLATELET
BASOS ABS: 0 10*3/uL (ref 0.0–0.1)
Basophils Relative: 0 %
EOS PCT: 0 %
Eosinophils Absolute: 0 10*3/uL (ref 0.0–0.7)
HEMATOCRIT: 39.4 % (ref 36.0–46.0)
HEMOGLOBIN: 13.2 g/dL (ref 12.0–15.0)
LYMPHS ABS: 1.1 10*3/uL (ref 0.7–4.0)
LYMPHS PCT: 17 %
MCH: 30 pg (ref 26.0–34.0)
MCHC: 33.5 g/dL (ref 30.0–36.0)
MCV: 89.5 fL (ref 78.0–100.0)
Monocytes Absolute: 0.4 10*3/uL (ref 0.1–1.0)
Monocytes Relative: 7 %
NEUTROS ABS: 4.7 10*3/uL (ref 1.7–7.7)
Neutrophils Relative %: 76 %
PLATELETS: 241 10*3/uL (ref 150–400)
RBC: 4.4 MIL/uL (ref 3.87–5.11)
RDW: 12.8 % (ref 11.5–15.5)
WBC: 6.2 10*3/uL (ref 4.0–10.5)

## 2017-12-22 LAB — COMPREHENSIVE METABOLIC PANEL
ALT: 25 U/L (ref 14–54)
ANION GAP: 13 (ref 5–15)
AST: 29 U/L (ref 15–41)
Albumin: 3.5 g/dL (ref 3.5–5.0)
Alkaline Phosphatase: 61 U/L (ref 38–126)
CHLORIDE: 99 mmol/L — AB (ref 101–111)
CO2: 24 mmol/L (ref 22–32)
Calcium: 9 mg/dL (ref 8.9–10.3)
Creatinine, Ser: 1.05 mg/dL — ABNORMAL HIGH (ref 0.44–1.00)
GFR calc Af Amer: >60 mL/min (ref >60)
Glucose, Bld: 109 mg/dL — ABNORMAL HIGH (ref 65–99)
POTASSIUM: 3.1 mmol/L — AB (ref 3.5–5.1)
Sodium: 136 mmol/L (ref 135–145)
TOTAL PROTEIN: 7.1 g/dL (ref 6.5–8.1)
Total Bilirubin: 0.7 mg/dL (ref 0.3–1.2)

## 2017-12-22 LAB — URINALYSIS, ROUTINE W REFLEX MICROSCOPIC
Bilirubin Urine: NEGATIVE
GLUCOSE, UA: NEGATIVE mg/dL
Hgb urine dipstick: NEGATIVE
Ketones, ur: 20 mg/dL — AB
Leukocytes, UA: NEGATIVE
Nitrite: NEGATIVE
PH: 6 (ref 5.0–8.0)
PROTEIN: NEGATIVE mg/dL
SPECIFIC GRAVITY, URINE: 1.016 (ref 1.005–1.030)

## 2017-12-22 LAB — INFLUENZA PANEL BY PCR (TYPE A & B)
Influenza A By PCR: POSITIVE — AB
Influenza B By PCR: NEGATIVE

## 2017-12-22 LAB — I-STAT CG4 LACTIC ACID, ED: LACTIC ACID, VENOUS: 1.46 mmol/L (ref 0.5–1.9)

## 2017-12-22 LAB — I-STAT BETA HCG BLOOD, ED (MC, WL, AP ONLY): I-stat hCG, quantitative: <5 m[IU]/mL (ref <5)

## 2017-12-22 MED ORDER — FLUTICASONE PROPIONATE 50 MCG/ACT NA SUSP: 2.0000 | Freq: Every day | NASAL | 0 refills | Status: DC

## 2017-12-22 MED ORDER — ONDANSETRON 4 MG PO TBDP
4.0000 mg | ORAL_TABLET | Freq: Once | ORAL | Status: AC
  Administered 2017-12-22: 4 mg via ORAL
  Filled 2017-12-22: qty 1

## 2017-12-22 MED ORDER — ONDANSETRON HCL 4 MG PO TABS: 4.0000 mg | ORAL_TABLET | Freq: Three times a day (TID) | ORAL | 0 refills | Status: DC | PRN

## 2017-12-22 MED ORDER — IBUPROFEN 800 MG PO TABS
800.0000 mg | ORAL_TABLET | Freq: Once | ORAL | Status: AC
  Administered 2017-12-22: 800 mg via ORAL
  Filled 2017-12-22: qty 1

## 2017-12-22 MED ORDER — PROMETHAZINE-DM 6.25-15 MG/5ML PO SYRP: 5.0000 mL | ORAL_SOLUTION | Freq: Four times a day (QID) | ORAL | 0 refills | Status: DC | PRN

## 2017-12-22 MED ORDER — ACETAMINOPHEN 325 MG PO TABS
650.0000 mg | ORAL_TABLET | Freq: Once | ORAL | Status: AC
  Administered 2017-12-22: 650 mg via ORAL
  Filled 2017-12-22: qty 2

## 2017-12-22 MED ORDER — POTASSIUM CHLORIDE ER 10 MEQ PO TBCR: 10.0000 meq | EXTENDED_RELEASE_TABLET | Freq: Every day | ORAL | 0 refills | Status: DC

## 2017-12-22 NOTE — Discharge Instructions (Addendum)
You have the flu. This should be treated symptomatically.  Use Tylenol or ibuprofen as needed for fevers or body aches. Use Flonase daily for nasal congestion and cough. Use cough syrup as needed.  Take potassium pills once a day for the next 7 days. Make sure you stay well-hydrated with water. Wash your hands frequently to prevent spread of infection. Return to the emergency room if you develop chest pain, difficulty breathing, or any new or worsening symptoms.

## 2017-12-22 NOTE — ED Notes (Signed)
Pt informed that when she is able, a UA needs to be collected from her.

## 2017-12-22 NOTE — ED Triage Notes (Signed)
Pt endorses nausea, emesis and generalized body aches started acutely yesterday. Pt denies diarrhea, abd pain, coughing with white phlegm and nasal congestion. Pt took alkaseltzer without relief of symptoms.

## 2017-12-22 NOTE — ED Notes (Signed)
Pt. Passed PO challenge.

## 2017-12-22 NOTE — ED Notes (Signed)
Pt encouraged to drink water per PA's request. Three 8oz cups of ice water were brought to Pt's room.

## 2017-12-22 NOTE — ED Provider Notes (Signed)
Patient placed in Quick Look pathway, seen and evaluated   Chief Complaint: Flulike symptoms  HPI:   Acute onset of nausea vomiting generalized body ache and cough yesterday.  Patient denies any dysuria, abdominal pain.  ROS: Cough (one)  Physical Exam:   Gen: No distress  Neuro: Awake and Alert  Skin: Warm    Focused Exam  No adventitious lungs sounds, no respiratory distress   Initiation of care has begun. The patient has been counseled on the process, plan, and necessity for staying for the completion/evaluation, and the remainder of the medical screening examination    Rosalio LoudHedges, Dede Dobesh, PA-C 12/22/17 1543    Eudelia Bunchardama, Amadeo GarnetPedro Eduardo, MD 12/22/17 2126

## 2017-12-22 NOTE — ED Provider Notes (Signed)
[facility] EMERGENCY DEPARTMENT Provider Note   CSN: [id number] Arrival date & time: 12/22/17  1449     History   Chief Complaint Chief Complaint  Patient presents with  . Emesis    HPI Stephanie Reid is a 27 y.o. female presenting for evaluation of with fever, congestion, cough, and vomiting.  Patient states that yesterday, she had acute onset of symptoms.  Since then, she has had persistent nausea and vomiting, unable to tolerate p.o.  She reports generalized body aches, fevers, cough, and congestion.  She denies headache, vision changes, ear pain, sore throat, abdominal pain, urinary symptoms, abnormal bowel movements.  She denies immunocompromised status.  She denies pulmonary history including COPD or asthma.  She denies sick contacts.  She did not get her flu shot this year.  She has no other medical problems, does not take medications daily.  She has not taken anything for her symptoms.  HPI  History reviewed. No pertinent past medical history.  Patient Active Problem List   Diagnosis Date Noted  . Amenorrhea, secondary 10/06/2013    History reviewed. No pertinent surgical history.  OB History    No data available       Home Medications    Prior to Admission medications   Medication Sig Start Date End Date Taking? Authorizing Provider  azithromycin (ZITHROMAX Z-PAK) 250 MG tablet Take 1 tablet (250 mg total) by mouth daily. Take 2 tabs for first dose, then 1 tab for each additional dose 10/21/17   Azalia Bilis, MD  benzonatate (TESSALON PERLES) 100 MG capsule Take 1 capsule (100 mg total) by mouth 3 (three) times daily as needed for cough (cough). 10/23/17   Muthersbaugh, Dahlia Client, PA-C  cetirizine (ZYRTEC ALLERGY) 10 MG tablet Take 1 tablet (10 mg total) by mouth daily. 10/23/17   Everlene Farrier, PA-C  fluticasone (FLONASE) 50 MCG/ACT nasal spray Place 2 sprays into both nostrils daily. 10/23/17   Muthersbaugh, Dahlia Client, PA-C  fluticasone  (FLONASE) 50 MCG/ACT nasal spray Place 2 sprays into both nostrils daily. 12/22/17   Everet Flagg, PA-C  lidocaine (XYLOCAINE) 2 % solution Use as directed 15 mLs as needed in the mouth or throat for mouth pain. Please swish and spit this medicine to help with throat pain. Patient not taking: Reported on 10/23/2017 10/03/17   Kellie Shropshire, PA-C  meclizine (ANTIVERT) 25 MG tablet Take 1 tablet (25 mg total) by mouth 3 (three) times daily as needed for nausea. 10/23/17   Everlene Farrier, PA-C  methocarbamol (ROBAXIN) 500 MG tablet Take 1 tablet (500 mg total) by mouth 2 (two) times daily. Patient not taking: Reported on 10/23/2017 07/05/17   Audry Pili, PA-C  naproxen (NAPROSYN) 250 MG tablet Take 1 tablet (250 mg total) by mouth 2 (two) times daily with a meal. 10/23/17   Everlene Farrier, PA-C  ondansetron (ZOFRAN ODT) 4 MG disintegrating tablet 4mg  ODT with your azithromycin for nausea 10/23/17   Muthersbaugh, Dahlia Client, PA-C  ondansetron (ZOFRAN) 4 MG tablet Take 1 tablet (4 mg total) by mouth every 8 (eight) hours as needed for nausea or vomiting. 12/22/17   Danissa Rundle, PA-C  Phenylephrine-DM-GG-APAP (DAYQUIL SEVERE + VAPOCOOL) 5-10-200-325 MG/15ML LIQD Take 15 mLs by mouth as needed (for cough).    [provider]  potassium chloride (K-DUR) 10 MEQ tablet Take 1 tablet (10 mEq total) by mouth daily for 7 days. 12/22/17 12/29/17  Marques Ericson, PA-C  promethazine-dextromethorphan (PROMETHAZINE-DM) 6.25-15 MG/5ML syrup Take 5 mLs by mouth 4 (  four) times daily as needed for cough. 12/22/17   Nahomy Limburg, PA-C    Family History Family History  Problem Relation Age of Onset  . Hypertension Mother   . Heart disease Mother   . Stroke Mother   . Diabetes Maternal Grandmother     Social History Social History   Tobacco Use  . Smoking status: Current Every Day Smoker    Packs/day: 0.10    Types: Cigarettes  . Smokeless tobacco: Never Used  Substance Use Topics  .  Alcohol use: Yes    Comment: Once a month  . Drug use: No     Allergies   Apple; Banana; Orange fruit [citrus]; Lactase-lactobacillus; Spinach; and Vicodin [hydrocodone-acetaminophen]   Review of Systems Review of Systems  Constitutional: Positive for chills and fever.  HENT: Positive for congestion.   Respiratory: Positive for cough.   Gastrointestinal: Positive for nausea and vomiting.  Musculoskeletal: Positive for myalgias.  All other systems reviewed and are negative.    Physical Exam Updated Vital Signs BP 117/64   Pulse 91   Temp 99.6 F (37.6 C) (Oral)   Resp 18   Ht 5\' 6"  (1.676 m)   Wt 94.3 kg (208 lb)   LMP  (LMP Unknown)   SpO2 100%   BMI 33.57 kg/m   Physical Exam  Constitutional: She is oriented to person, place, and time. She appears well-developed and well-nourished. No distress.  Pt appears uncomfortable but nontoxic  HENT:  Head: Normocephalic and atraumatic.  Right Ear: Tympanic membrane, external ear and ear canal normal.  Left Ear: Tympanic membrane, external ear and ear canal normal.  Nose: Mucosal edema present. Right sinus exhibits no maxillary sinus tenderness and no frontal sinus tenderness. Left sinus exhibits no maxillary sinus tenderness and no frontal sinus tenderness.  Mouth/Throat: Uvula is midline, oropharynx is clear and moist and mucous membranes are normal. No tonsillar exudate.  Nasal mucosal edema.  OP clear without tonsillar swelling or exudate.  TMs nonerythematous and not bulging bilaterally.  Eyes: Conjunctivae and EOM are normal. Pupils are equal, round, and reactive to light.  Neck: Normal range of motion.  Cardiovascular: Normal rate, regular rhythm and intact distal pulses.  Pulmonary/Chest: Effort normal and breath sounds normal. She has no decreased breath sounds. She has no wheezes. She has no rhonchi. She has no rales.  Pt speaking in full sentences without difficulty. Clear lung sounds in all fields  Abdominal: Soft.  She exhibits no distension. There is no tenderness.  Musculoskeletal: Normal range of motion.  Lymphadenopathy:    She has cervical adenopathy.  Neurological: She is alert and oriented to person, place, and time.  Skin: Skin is warm.  Psychiatric: She has a normal mood and affect.  Nursing note and vitals reviewed.    ED Treatments / Results  Labs (all labs ordered are listed, but only abnormal results are displayed) Labs Reviewed  COMPREHENSIVE METABOLIC PANEL - Abnormal; Notable for the following components:      Result Value   Potassium 3.1 (*)    Chloride 99 (*)    Glucose, Bld 109 (*)    BUN <5 (*)    Creatinine, Ser 1.05 (*)    All other components within normal limits  URINALYSIS, ROUTINE W REFLEX MICROSCOPIC - Abnormal; Notable for the following components:   Ketones, ur 20 (*)    All other components within normal limits  INFLUENZA PANEL BY PCR (TYPE A & B) - Abnormal; Notable for the following components:  Influenza A By PCR POSITIVE (*)    All other components within normal limits  CBC WITH DIFFERENTIAL/PLATELET  I-STAT CG4 LACTIC ACID, ED  I-STAT BETA HCG BLOOD, ED ([facility], [facility], [facility] ONLY)  I-STAT CG4 LACTIC ACID, ED    EKG  EKG Interpretation None       Radiology Dg Chest 2 View  Result Date: 12/22/2017 CLINICAL DATA:  Nausea, vomiting EXAM: CHEST  2 VIEW COMPARISON:  10/23/2017 FINDINGS: Low lung volumes, which accentuates heart size, felt to be within normal range. Lungs are clear. No effusions or acute bony abnormality. IMPRESSION: Low lung volumes.  No active cardiopulmonary disease. Electronically Signed   By: Charlett NoseKevin  Dover M.D.   On: 12/22/2017 17:04    Procedures Procedures (including critical care time)  Medications Ordered in ED Medications  acetaminophen (TYLENOL) tablet 650 mg (650 mg Oral Given 12/22/17 1530)  ondansetron (ZOFRAN-ODT) disintegrating tablet 4 mg (4 mg Oral Given 12/22/17 1530)  ibuprofen (ADVIL,MOTRIN) tablet 800 mg (800 mg Oral  Given 12/22/17 2034)     Initial Impression / Assessment and Plan / ED Course  I have reviewed the triage vital signs and the nursing notes.  Pertinent labs & imaging results that were available during my care of the patient were reviewed by me and considered in my medical decision making (see chart for details).     Pt presenting with acute onset flulike symptoms.  She has associated nausea and vomiting, unable to tolerate p.o.  Workup shows patient initially febrile and tachycardic, which mildly improved with Tylenol.  Fever then returned, will give ibuprofen and reassess.  Flu A positive.  No leukocytosis.  CMP shows patient is slightly dry, slight AKI, likely due to dehydration. Mild hypokalemia.  Discussed IV fluids, patient declines and wants to have p.o. hydration instead.  Nausea improved with Zofran.  Will trial p.o./encourage fluids and recheck vitals.  Heart rate improved.  Temperature improved.  Discussed with patient, patient states she wants to go home.  Discussed importance of hydration.  Will treat symptomatically and give prescription for potassium.  Strict return precautions given.  At this time, patient appears safe for discharge.  Patient states she understands and agrees to plan.  Final Clinical Impressions(s) / ED Diagnoses   Final diagnoses:  Flu  Hypokalemia    ED Discharge Orders        Ordered    ondansetron (ZOFRAN) 4 MG tablet  Every 8 hours PRN     12/22/17 2212    fluticasone (FLONASE) 50 MCG/ACT nasal spray  Daily     12/22/17 2212    promethazine-dextromethorphan (PROMETHAZINE-DM) 6.25-15 MG/5ML syrup  4 times daily PRN     12/22/17 2212    potassium chloride (K-DUR) 10 MEQ tablet  Daily     12/22/17 2212       Alveria ApleyCaccavale, Mayelin Panos, PA-C 12/23/17 0105    Nira Connardama, Pedro Eduardo, MD 12/24/17 [phone]

## 2018-01-21 IMAGING — CR DG CHEST 2V
2 series · 2 of 2 positions shown · non-contrast
Comparison: 12/10/2015

CLINICAL DATA: Cough, congestion, fever at home.

EXAM:
CHEST  2 VIEW

[chest pa]
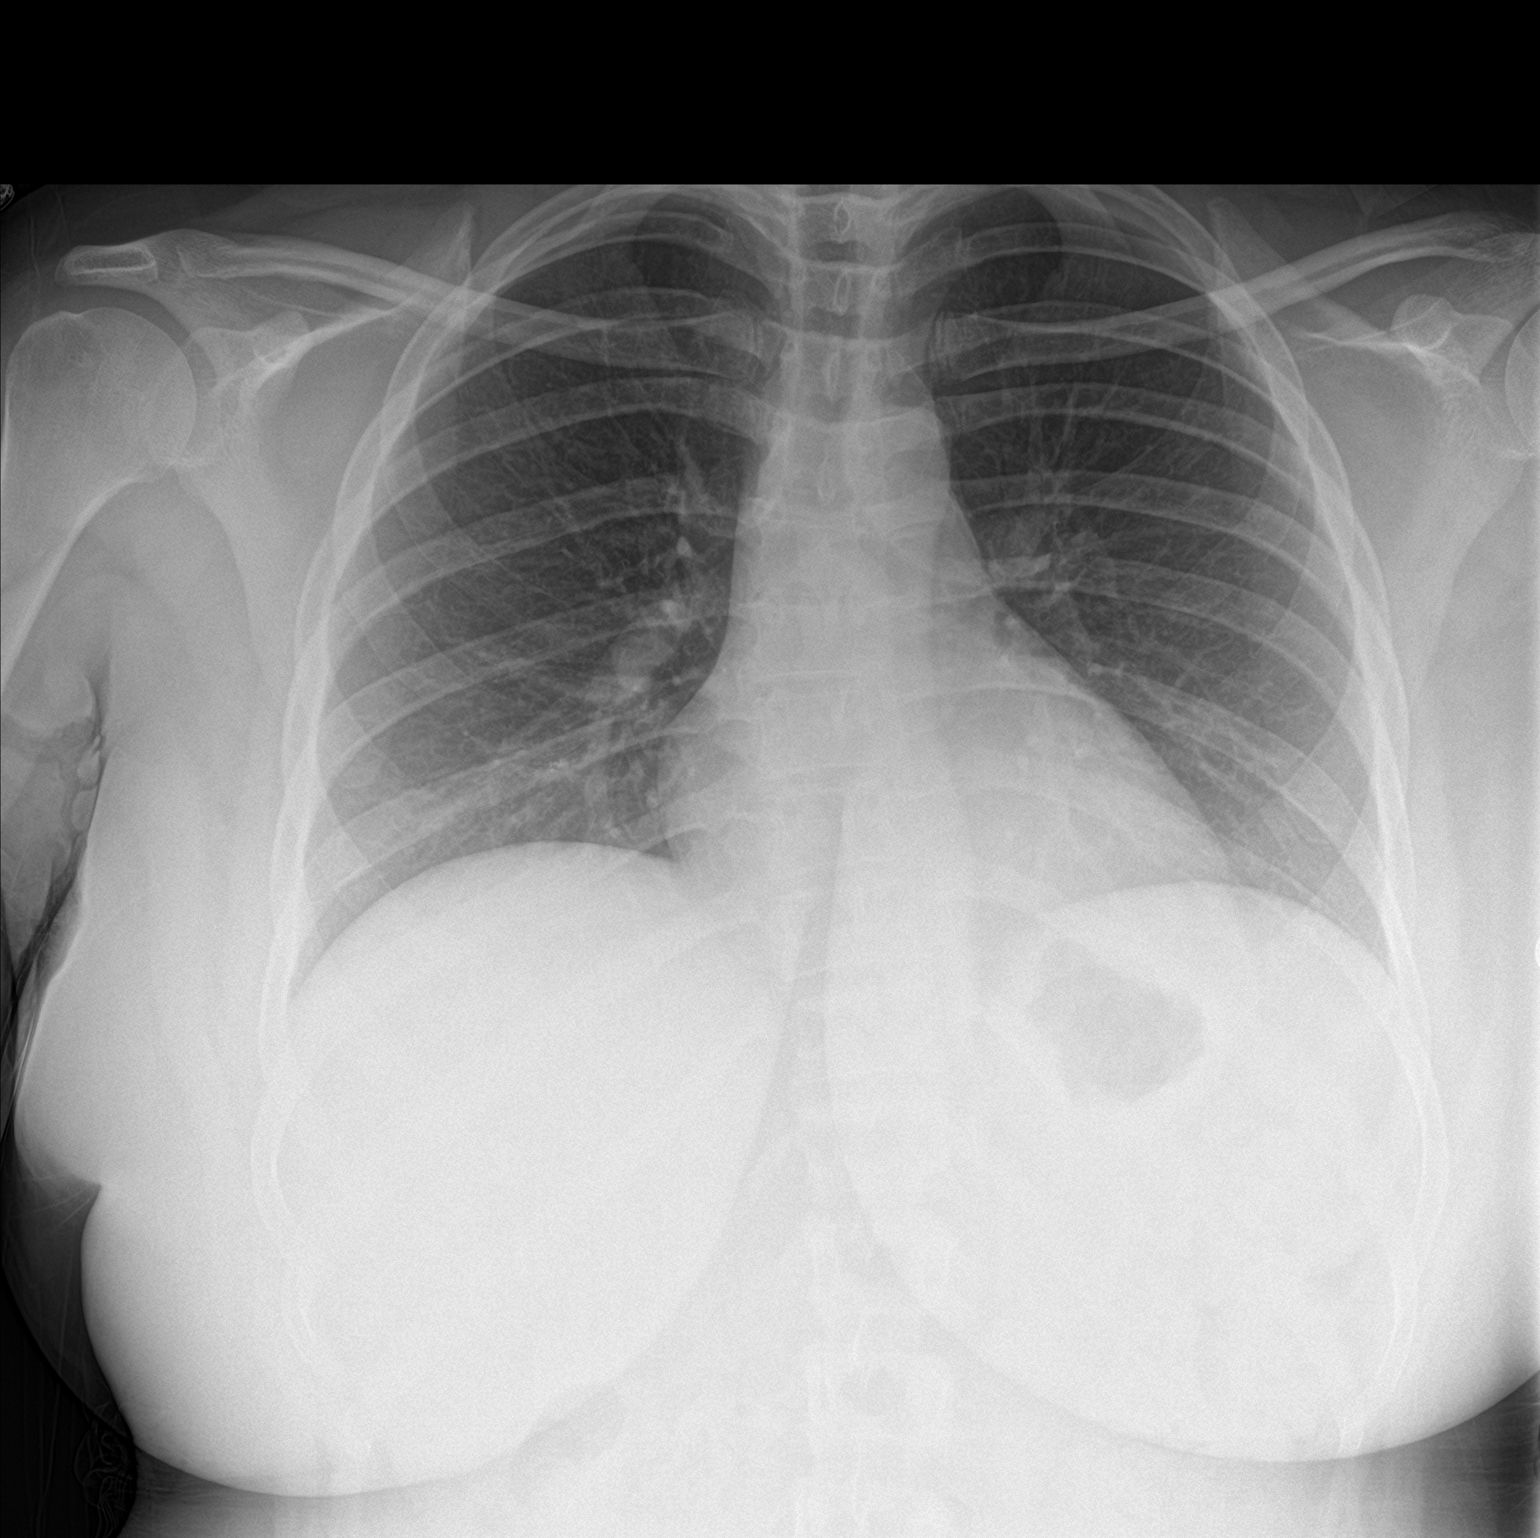

[chest lat]
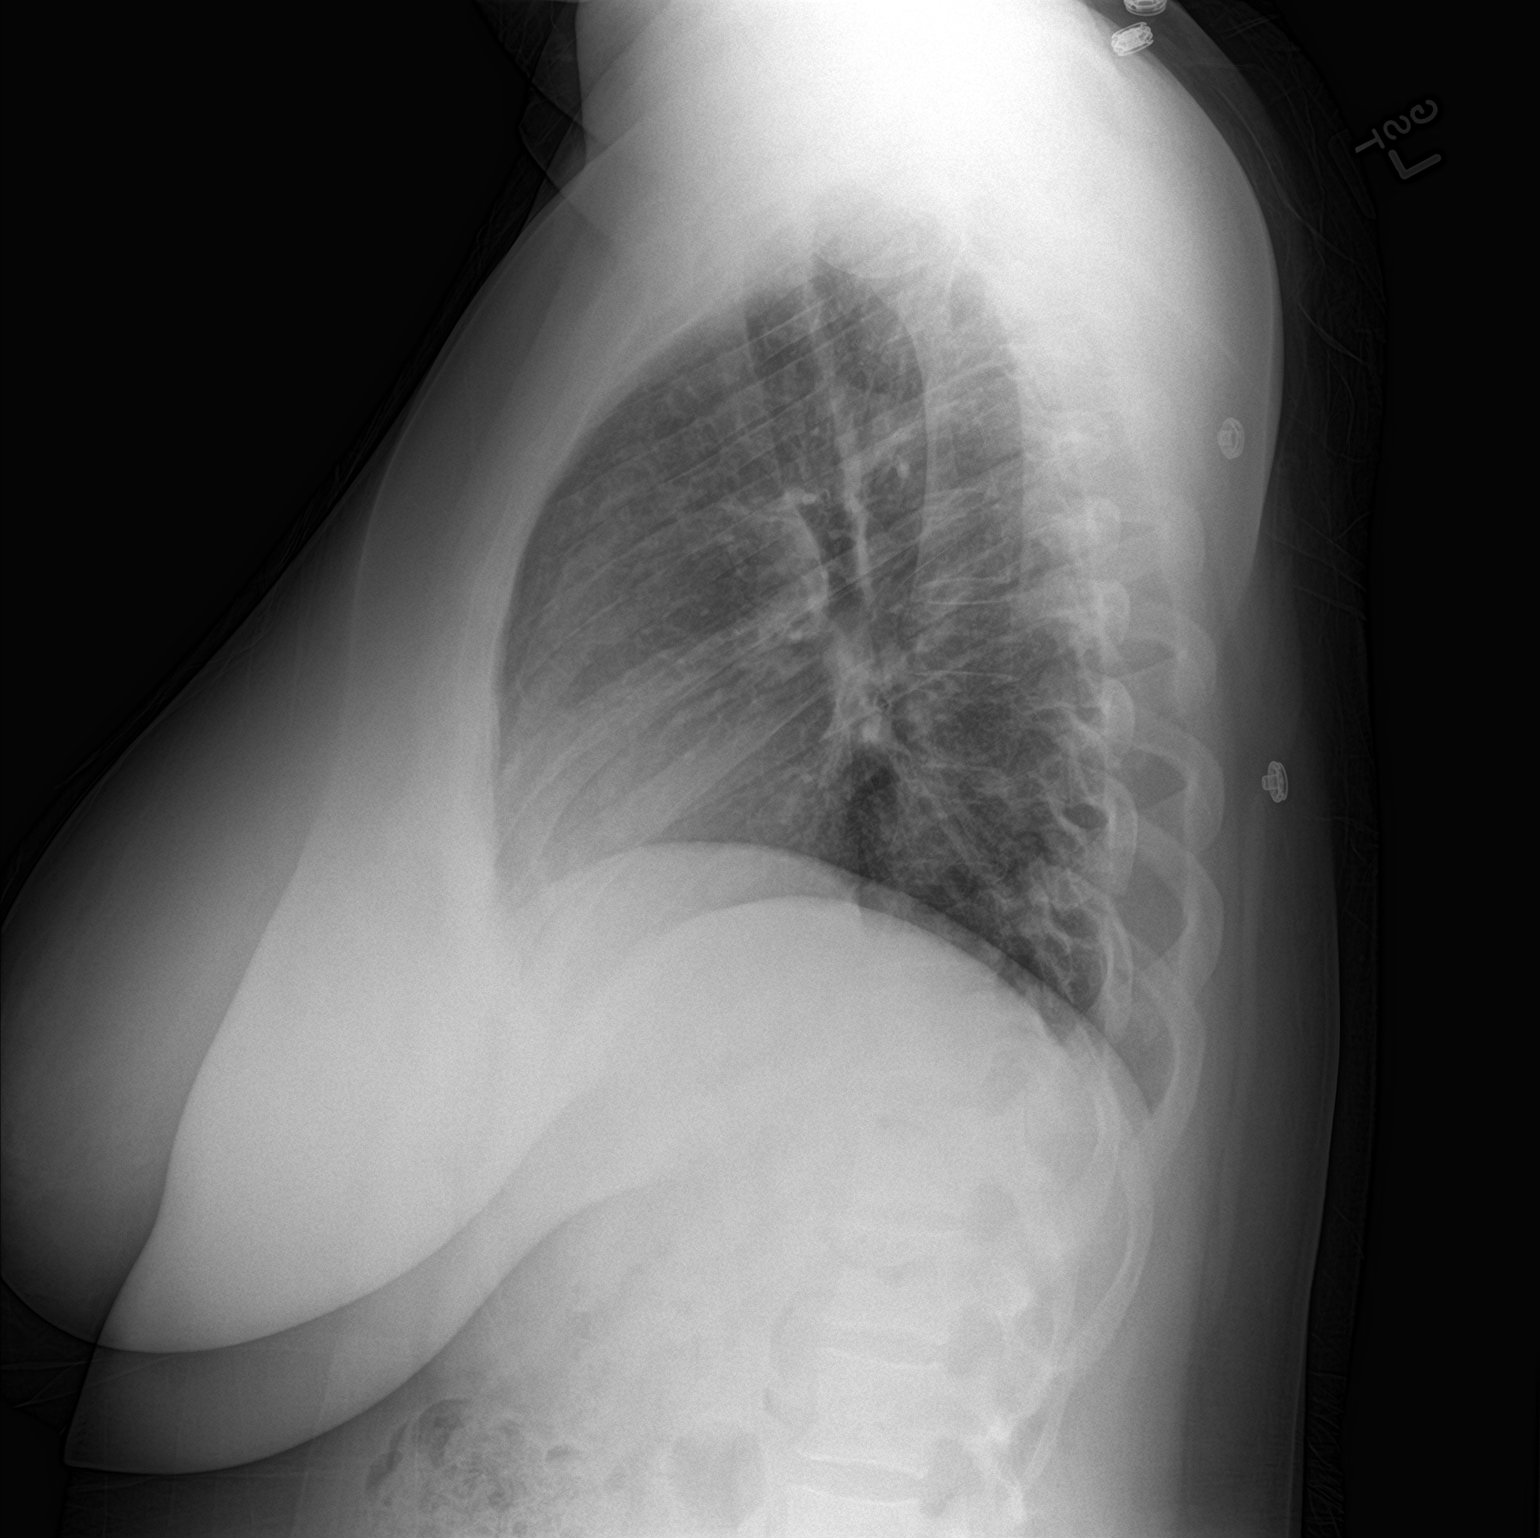

[2 of 2 positions shown; findings below may reference images not displayed]

FINDINGS: The cardiomediastinal contours are normal. The lungs are clear.
Pulmonary vasculature is normal. No consolidation, pleural effusion,
or pneumothorax. No acute osseous abnormalities are seen.
IMPRESSION: Unremarkable radiographs of the chest.

## 2018-02-04 ENCOUNTER — Inpatient Hospital Stay (HOSPITAL_COMMUNITY)
Admission: AD | Admit: 2018-02-04 | Discharge: 2018-02-05 | Disposition: A | Discharge status: Home / Self Care | Payer: Self-pay | Source: Ambulatory Visit | Attending: Obstetrics and Gynecology | Admitting: Obstetrics and Gynecology

## 2018-02-04 DIAGNOSIS — R1084 Generalized abdominal pain: Secondary | ICD-10-CM | POA: Insufficient documentation

## 2018-02-04 DIAGNOSIS — B9689 Other specified bacterial agents as the cause of diseases classified elsewhere: Secondary | ICD-10-CM | POA: Insufficient documentation

## 2018-02-04 DIAGNOSIS — N760 Acute vaginitis: Secondary | ICD-10-CM | POA: Insufficient documentation

## 2018-02-04 LAB — WET PREP, GENITAL
Sperm: NONE SEEN
TRICH WET PREP: NONE SEEN
YEAST WET PREP: NONE SEEN

## 2018-02-04 LAB — URINALYSIS, ROUTINE W REFLEX MICROSCOPIC
Bacteria, UA: NONE SEEN
Bilirubin Urine: NEGATIVE
GLUCOSE, UA: NEGATIVE mg/dL
Hgb urine dipstick: NEGATIVE
KETONES UR: NEGATIVE mg/dL
Nitrite: NEGATIVE
PROTEIN: NEGATIVE mg/dL
Specific Gravity, Urine: 1.015 (ref 1.005–1.030)
pH: 6 (ref 5.0–8.0)

## 2018-02-04 LAB — POCT PREGNANCY, URINE: Preg Test, Ur: NEGATIVE

## 2018-02-04 NOTE — MAU Note (Signed)
Pt reports lower back and abdominal pain that started 4 days ago. Pt denies taking anything for pain. Also reports a white, thick discharge at the same time. Pt denies odor, itching or irritation. Pt denies vaginal bleeding. Pt states she has not had a period in years-was on Depo, stopped in 2008 and never got a period

## 2018-02-05 LAB — GC/CHLAMYDIA PROBE AMP (~~LOC~~) NOT AT ARMC
CHLAMYDIA, DNA PROBE: NEGATIVE
Neisseria Gonorrhea: NEGATIVE

## 2018-02-05 MED ORDER — METRONIDAZOLE 500 MG PO TABS: 500.0000 mg | ORAL_TABLET | Freq: Two times a day (BID) | ORAL | 0 refills | Status: DC

## 2018-02-05 NOTE — Discharge Instructions (Signed)
Probiotics  What are probiotics?  Probiotics are the good bacteria and yeasts that live in your body and keep you and your digestive system healthy. Probiotics also help your body's defense (immune) system and protect your body against bad bacterial growth.  Certain foods contain probiotics, such as yogurt. Probiotics can also be purchased as a supplement. As with any supplement or drug, it is important to discuss its use with your health care provider.  What affects the balance of bacteria in my body?  The balance of bacteria in your body can be affected by:   Antibiotic medicines. Antibiotics are sometimes necessary to treat infection. Unfortunately, they may kill good or friendly bacteria in your body as well as the bad bacteria. This may lead to stomach problems like diarrhea, gas, and cramping.   Disease. Some conditions are the result of an overgrowth of bad bacteria, yeasts, parasites, or fungi. These conditions include:  ? Infectious diarrhea.  ? Stomach and respiratory infections.  ? Skin infections.  ? Irritable bowel syndrome (IBS).  ? Inflammatory bowel diseases.  ? Ulcer due to Helicobacter pylori (H. pylori) infection.  ? Tooth decay and periodontal disease.  ? Vaginal infections.    Stress and poor diet may also lower the good bacteria in your body.  What type of probiotic is right for me?  Probiotics are available over the counter at your local pharmacy, health food, or grocery store. They come in many different forms, combinations of strains, and dosing strengths. Some may need to be refrigerated. Always read the label for storage and usage instructions.  Specific strains have been shown to be more effective for certain conditions. Ask your health care provider what option is best for you.  Why would I need probiotics?  There are many reasons your health care provider might recommend a probiotic supplement, including:   Diarrhea.   Constipation.   IBS.   Respiratory infections.   Yeast  infections.   Acne, eczema, and other skin conditions.   Frequent urinary tract infections (UTIs).    Are there side effects of probiotics?  Some people experience mild side effects when taking probiotics. Side effects are usually temporary and may include:   Gas.   Bloating.   Cramping.    Rarely, serious side effects, such as infection or immune system changes, may occur.  What else do I need to know about probiotics?   There are many different strains of probiotics. Certain strains may be more effective depending on your condition. Probiotics are available in varying doses. Ask your health care provider which probiotic you should use and how often.   If you are taking probiotics along with antibiotics, it is generally recommended to wait at least 2 hours between taking the antibiotic and taking the probiotic.  For more information:  National Center for Complementary and Alternative Medicine http://nccam.nih.gov/  This information is not intended to replace advice given to you by your health care provider. Make sure you discuss any questions you have with your health care provider.  Document Released: 06/08/2014 Document Revised: 10/08/2016 Document Reviewed: 02/08/2014  Elsevier Interactive Patient Education  2017 [employer]    Bacterial Vaginosis  Bacterial vaginosis is a vaginal infection that occurs when the normal balance of bacteria in the vagina is disrupted. It results from an overgrowth of certain bacteria. This is the most common vaginal infection among women ages 15-44.  Because bacterial vaginosis increases your risk for STIs (sexually transmitted infections), getting treated can help   reduce your risk for chlamydia, gonorrhea, herpes, and HIV (human immunodeficiency virus). Treatment is also important for preventing complications in pregnant women, because this condition can cause an early (premature) delivery.  What are the causes?  This condition is caused by an increase in harmful  bacteria that are normally present in small amounts in the vagina. However, the reason that the condition develops is not fully understood.  What increases the risk?  The following factors may make you more likely to develop this condition:   Having a new sexual partner or multiple sexual partners.   Having unprotected sex.   Douching.   Having an intrauterine device (IUD).   Smoking.   Drug and alcohol abuse.   Taking certain antibiotic medicines.   Being pregnant.    You cannot get bacterial vaginosis from toilet seats, bedding, swimming pools, or contact with objects around you.  What are the signs or symptoms?  Symptoms of this condition include:   Grey or white vaginal discharge. The discharge can also be watery or foamy.   A fish-like odor with discharge, especially after sexual intercourse or during menstruation.   Itching in and around the vagina.   Burning or pain with urination.    Some women with bacterial vaginosis have no signs or symptoms.  How is this diagnosed?  This condition is diagnosed based on:   Your medical history.   A physical exam of the vagina.   Testing a sample of vaginal fluid under a microscope to look for a large amount of bad bacteria or abnormal cells. Your health care provider may use a cotton swab or a small wooden spatula to collect the sample.    How is this treated?  This condition is treated with antibiotics. These may be given as a pill, a vaginal cream, or a medicine that is put into the vagina (suppository). If the condition comes back after treatment, a second round of antibiotics may be needed.  Follow these instructions at home:  Medicines   Take over-the-counter and prescription medicines only as told by your health care provider.   Take or use your antibiotic as told by your health care provider. Do not stop taking or using the antibiotic even if you start to feel better.  General instructions   If you have a female sexual partner, tell her that you  have a vaginal infection. She should see her health care provider and be treated if she has symptoms. If you have a female sexual partner, he does not need treatment.   During treatment:  ? Avoid sexual activity until you finish treatment.  ? Do not douche.  ? Avoid alcohol as directed by your health care provider.  ? Avoid breastfeeding as directed by your health care provider.   Drink enough water and fluids to keep your urine clear or pale yellow.   Keep the area around your vagina and rectum clean.  ? Wash the area daily with warm water.  ? Wipe yourself from front to back after using the toilet.   Keep all follow-up visits as told by your health care provider. This is important.  How is this prevented?   Do not douche.   Wash the outside of your vagina with warm water only.   Use protection when having sex. This includes latex condoms and dental dams.   Limit how many sexual partners you have. To help prevent bacterial vaginosis, it is best to have sex with just one partner (monogamous).     Make sure you and your sexual partner are tested for STIs.   Wear cotton or cotton-lined underwear.   Avoid wearing tight pants and pantyhose, especially during summer.   Limit the amount of alcohol that you drink.   Do not use any products that contain nicotine or tobacco, such as cigarettes and e-cigarettes. If you need help quitting, ask your health care provider.   Do not use illegal drugs.  Where to find more information:   Centers for Disease Control and Prevention: www.cdc.gov/std   American Sexual Health Association (ASHA): www.ashastd.org   [place] Department of Health and Human Services, Office on Women's Health: www.womenshealth.gov/ or https://www.womenshealth.gov/a-z-topics/bacterial-vaginosis  Contact a health care provider if:   Your symptoms do not improve, even after treatment.   You have more discharge or pain when urinating.   You have a fever.   You have pain in your abdomen.   You have  pain during sex.   You have vaginal bleeding between periods.  Summary   Bacterial vaginosis is a vaginal infection that occurs when the normal balance of bacteria in the vagina is disrupted.   Because bacterial vaginosis increases your risk for STIs (sexually transmitted infections), getting treated can help reduce your risk for chlamydia, gonorrhea, herpes, and HIV (human immunodeficiency virus). Treatment is also important for preventing complications in pregnant women, because the condition can cause an early (premature) delivery.   This condition is treated with antibiotic medicines. These may be given as a pill, a vaginal cream, or a medicine that is put into the vagina (suppository).  This information is not intended to replace advice given to you by your health care provider. Make sure you discuss any questions you have with your health care provider.  Document Released: 11/11/2005 Document Revised: 03/17/2017 Document Reviewed: 07/27/2016  Elsevier Interactive Patient Education  2018 [employer]

## 2018-03-12 ENCOUNTER — Encounter (HOSPITAL_COMMUNITY): Payer: Self-pay | Admitting: Family Medicine

## 2018-03-12 ENCOUNTER — Ambulatory Visit (HOSPITAL_COMMUNITY)
Admission: EM | Admit: 2018-03-12 | Discharge: 2018-03-12 | Disposition: A | Discharge status: Home / Self Care | Payer: Self-pay | Attending: Family Medicine | Admitting: Family Medicine

## 2018-03-12 DIAGNOSIS — K029 Dental caries, unspecified: Secondary | ICD-10-CM

## 2018-03-12 MED ORDER — OXYCODONE-ACETAMINOPHEN 5-325 MG PO TABS: 1.0000 | ORAL_TABLET | Freq: Four times a day (QID) | ORAL | 0 refills | Status: AC | PRN

## 2018-03-12 MED ORDER — IBUPROFEN 800 MG PO TABS
800.0000 mg | ORAL_TABLET | Freq: Once | ORAL | Status: AC
  Administered 2018-03-12: 800 mg via ORAL

## 2018-03-12 MED ORDER — PENICILLIN V POTASSIUM 500 MG PO TABS: 500.0000 mg | ORAL_TABLET | Freq: Three times a day (TID) | ORAL | 0 refills | Status: DC

## 2018-03-12 MED ORDER — IBUPROFEN 800 MG PO TABS
ORAL_TABLET | ORAL | Status: AC
  Filled 2018-03-12: qty 1

## 2018-03-12 MED ORDER — IBUPROFEN 600 MG PO TABS: 600.0000 mg | ORAL_TABLET | Freq: Four times a day (QID) | ORAL | 0 refills | Status: DC | PRN

## 2018-03-12 NOTE — ED Provider Notes (Signed)
[facility]    CSN: [id number] Arrival date & time: 03/12/18  1524     History   Chief Complaint Chief Complaint  Patient presents with  . Dental Pain    HPI Stephanie Reid is a 27 y.o. female.   27 year old female, presenting today complaining of dental pain.  Patient states that she has had intermittent right lower dental pain over the past year.  States that the tooth chipped approximately 4 months ago.  This current episode of pain started about 4 to 5 days ago.  She does not currently have a dentist that she sees.  Has increased pain with eating and drinking on the affected side.  The history is provided by the patient.  Dental Pain  Location:  Lower Lower teeth location:  30/RL 1st molar Quality:  Aching Severity:  Moderate Onset quality:  Gradual Duration:  4 days Timing:  Constant Progression:  Unchanged Chronicity:  Recurrent Context: dental caries   Context: not abscess, cap still on and not crown fracture   Previous work-up:  Dental exam Relieved by:  Nothing Worsened by:  Hot food/drink, cold food/drink and touching Ineffective treatments:  Rest Associated symptoms: gum swelling   Associated symptoms: no congestion, no difficulty swallowing, no drooling, no facial pain, no facial swelling, no fever, no headaches, no neck pain, no neck swelling, no oral bleeding, no oral lesions and no trismus   Risk factors: lack of dental care   Risk factors: no alcohol problem, no cancer and no chewing tobacco use     History reviewed. No pertinent past medical history.  Patient Active Problem List   Diagnosis Date Noted  . Amenorrhea, secondary 10/06/2013    History reviewed. No pertinent surgical history.  OB History   None      Home Medications    Prior to Admission medications   Medication Sig Start Date End Date Taking? Authorizing Provider  cetirizine (ZYRTEC ALLERGY) 10 MG tablet Take 1 tablet (10 mg total) by mouth daily. 10/23/17    Everlene Farrier, PA-C  fluticasone (FLONASE) 50 MCG/ACT nasal spray Place 2 sprays into both nostrils daily. 10/23/17   Muthersbaugh, Dahlia Client, PA-C  ibuprofen (ADVIL,MOTRIN) 600 MG tablet Take 1 tablet (600 mg total) by mouth every 6 (six) hours as needed. 03/12/18   Blue, Olivia C, PA-C  oxyCODONE-acetaminophen (PERCOCET/ROXICET) 5-325 MG tablet Take 1 tablet by mouth every 6 (six) hours as needed for up to 3 days for severe pain. 03/12/18 03/15/18  Blue, Olivia C, PA-C  penicillin v potassium (VEETID) 500 MG tablet Take 1 tablet (500 mg total) by mouth 3 (three) times daily. 03/12/18   Blue, Marylene Land, PA-C    Family History Family History  Problem Relation Age of Onset  . Hypertension Mother   . Heart disease Mother   . Stroke Mother   . Diabetes Maternal Grandmother     Social History Social History   Tobacco Use  . Smoking status: Current Every Day Smoker    Packs/day: 0.10    Types: Cigarettes  . Smokeless tobacco: Never Used  Substance Use Topics  . Alcohol use: Yes    Comment: Once a month  . Drug use: No     Allergies   Apple; Banana; Orange fruit [citrus]; Lactase-lactobacillus; Spinach; and Vicodin [hydrocodone-acetaminophen]   Review of Systems Review of Systems  Constitutional: Negative for chills and fever.  HENT: Positive for dental problem (right lower ). Negative for congestion, drooling, ear pain, facial swelling, mouth  sores and sore throat.   Eyes: Negative for pain and visual disturbance.  Respiratory: Negative for cough and shortness of breath.   Cardiovascular: Negative for chest pain and palpitations.  Gastrointestinal: Negative for abdominal pain and vomiting.  Genitourinary: Negative for dysuria and hematuria.  Musculoskeletal: Negative for arthralgias, back pain and neck pain.  Skin: Negative for color change and rash.  Neurological: Negative for seizures, syncope and headaches.  All other systems reviewed and are negative.    Physical  Exam Triage Vital Signs ED Triage Vitals  Enc Vitals Group     BP 03/12/18 1552 135/78     Pulse Rate 03/12/18 1552 90     Resp 03/12/18 1552 18     Temp 03/12/18 1552 99 F (37.2 C)     Temp src --      SpO2 03/12/18 1552 100 %     Weight --      Height --      Head Circumference --      Peak Flow --      Pain Score 03/12/18 1548 10     Pain Loc --      Pain Edu? --      Excl. in GC? --    No data found.  Updated Vital Signs BP 135/78   Pulse 90   Temp 99 F (37.2 C)   Resp 18   SpO2 100%   Visual Acuity Right Eye Distance:   Left Eye Distance:   Bilateral Distance:    Right Eye Near:   Left Eye Near:    Bilateral Near:     Physical Exam  Constitutional: She appears well-developed and well-nourished. No distress.  HENT:  Head: Normocephalic and atraumatic.  Right Ear: Hearing, tympanic membrane, external ear and ear canal normal.  Left Ear: Hearing, tympanic membrane, external ear and ear canal normal.  Nose: Nose normal.  Mouth/Throat: Oropharynx is clear and moist. Dental caries present. No oropharyngeal exudate, posterior oropharyngeal edema, posterior oropharyngeal erythema or tonsillar abscesses.    Dental caries to the affected tooth.  No abscess amenable to drainage  Eyes: Conjunctivae are normal.  Neck: Neck supple.  Cardiovascular: Normal rate and regular rhythm.  No murmur heard. Pulmonary/Chest: Effort normal and breath sounds normal. No stridor. No respiratory distress. She has no wheezes. She has no rhonchi. She has no rales.  Abdominal: Soft. There is no tenderness.  Musculoskeletal: She exhibits no edema.  Neurological: She is alert.  Skin: Skin is warm and dry.  Psychiatric: She has a normal mood and affect.  Nursing note and vitals reviewed.    UC Treatments / Results  Labs (all labs ordered are listed, but only abnormal results are displayed) Labs Reviewed - No data to display  EKG None Radiology No results  found.  Procedures Procedures (including critical care time)  Medications Ordered in UC Medications - No data to display   Initial Impression / Assessment and Plan / UC Course  I have reviewed the triage vital signs and the nursing notes.  Pertinent labs & imaging results that were available during my care of the patient were reviewed by me and considered in my medical decision making (see chart for details).     Dental caries. No abscess amenable to drainage. Antibiotics, pain meds for short period of time, NSAIDs and given dental resource guide    Final Clinical Impressions(s) / UC Diagnoses   Final diagnoses:  Pain due to dental caries    ED  Discharge Orders        Ordered    penicillin v potassium (VEETID) 500 MG tablet  3 times daily     03/12/18 1553    oxyCODONE-acetaminophen (PERCOCET/ROXICET) 5-325 MG tablet  Every 6 hours PRN     03/12/18 1553    ibuprofen (ADVIL,MOTRIN) 600 MG tablet  Every 6 hours PRN     03/12/18 1553       Controlled Substance Prescriptions [place] Controlled Substance Registry consulted? Yes, I have consulted the [place] Controlled Substances Registry for this patient, and feel the risk/benefit ratio today is favorable for proceeding with this prescription for a controlled substance.   Alecia Lemming, [place] 03/12/18 1558

## 2018-03-12 NOTE — ED Triage Notes (Signed)
Pt here for dental pain x 4 days.

## 2018-05-20 ENCOUNTER — Inpatient Hospital Stay (HOSPITAL_COMMUNITY)
Admission: AD | Admit: 2018-05-20 | Discharge: 2018-05-20 | Disposition: A | Discharge status: Home / Self Care | Payer: Self-pay | Source: Ambulatory Visit | Attending: Obstetrics and Gynecology | Admitting: Obstetrics and Gynecology

## 2018-05-20 ENCOUNTER — Encounter (HOSPITAL_COMMUNITY): Payer: Self-pay | Admitting: *Deleted

## 2018-05-20 DIAGNOSIS — Z91018 Allergy to other foods: Secondary | ICD-10-CM | POA: Insufficient documentation

## 2018-05-20 DIAGNOSIS — Z8249 Family history of ischemic heart disease and other diseases of the circulatory system: Secondary | ICD-10-CM | POA: Insufficient documentation

## 2018-05-20 DIAGNOSIS — Z113 Encounter for screening for infections with a predominantly sexual mode of transmission: Secondary | ICD-10-CM | POA: Insufficient documentation

## 2018-05-20 DIAGNOSIS — F17210 Nicotine dependence, cigarettes, uncomplicated: Secondary | ICD-10-CM | POA: Insufficient documentation

## 2018-05-20 DIAGNOSIS — N760 Acute vaginitis: Secondary | ICD-10-CM | POA: Insufficient documentation

## 2018-05-20 DIAGNOSIS — Z823 Family history of stroke: Secondary | ICD-10-CM | POA: Insufficient documentation

## 2018-05-20 DIAGNOSIS — Z885 Allergy status to narcotic agent status: Secondary | ICD-10-CM | POA: Insufficient documentation

## 2018-05-20 DIAGNOSIS — Z8619 Personal history of other infectious and parasitic diseases: Secondary | ICD-10-CM | POA: Insufficient documentation

## 2018-05-20 DIAGNOSIS — B9689 Other specified bacterial agents as the cause of diseases classified elsewhere: Secondary | ICD-10-CM | POA: Insufficient documentation

## 2018-05-20 HISTORY — DX: Personal history of other infectious and parasitic diseases: Z86.19

## 2018-05-20 LAB — URINALYSIS, ROUTINE W REFLEX MICROSCOPIC
Bacteria, UA: NONE SEEN
Bilirubin Urine: NEGATIVE
GLUCOSE, UA: NEGATIVE mg/dL
Hgb urine dipstick: NEGATIVE
Ketones, ur: NEGATIVE mg/dL
Nitrite: NEGATIVE
PH: 5 (ref 5.0–8.0)
Protein, ur: NEGATIVE mg/dL
SPECIFIC GRAVITY, URINE: 1.018 (ref 1.005–1.030)

## 2018-05-20 LAB — WET PREP, GENITAL
SPERM: NONE SEEN
Trich, Wet Prep: NONE SEEN
WBC, Wet Prep HPF POC: NONE SEEN
YEAST WET PREP: NONE SEEN

## 2018-05-20 LAB — POCT PREGNANCY, URINE: Preg Test, Ur: NEGATIVE

## 2018-05-20 LAB — HEPATITIS B SURFACE ANTIGEN: HEP B S AG: NEGATIVE

## 2018-05-20 NOTE — Discharge Instructions (Signed)
Bacterial Vaginosis Bacterial vaginosis is a vaginal infection that occurs when the normal balance of bacteria in the vagina is disrupted. It results from an overgrowth of certain bacteria. This is the most common vaginal infection among women ages 15-44. Because bacterial vaginosis increases your risk for STIs (sexually transmitted infections), getting treated can help reduce your risk for chlamydia, gonorrhea, herpes, and HIV (human immunodeficiency virus). Treatment is also important for preventing complications in pregnant women, because this condition can cause an early (premature) delivery. What are the causes? This condition is caused by an increase in harmful bacteria that are normally present in small amounts in the vagina. However, the reason that the condition develops is not fully understood. What increases the risk? The following factors may make you more likely to develop this condition:  Having a new sexual partner or multiple sexual partners.  Having unprotected sex.  Douching.  Having an intrauterine device (IUD).  Smoking.  Drug and alcohol abuse.  Taking certain antibiotic medicines.  Being pregnant.  You cannot get bacterial vaginosis from toilet seats, bedding, swimming pools, or contact with objects around you. What are the signs or symptoms? Symptoms of this condition include:  Grey or white vaginal discharge. The discharge can also be watery or foamy.  A fish-like odor with discharge, especially after sexual intercourse or during menstruation.  Itching in and around the vagina.  Burning or pain with urination.  Some women with bacterial vaginosis have no signs or symptoms. How is this diagnosed? This condition is diagnosed based on:  Your medical history.  A physical exam of the vagina.  Testing a sample of vaginal fluid under a microscope to look for a large amount of bad bacteria or abnormal cells. Your health care provider may use a cotton swab  or a small wooden spatula to collect the sample.  How is this treated? This condition is treated with antibiotics. These may be given as a pill, a vaginal cream, or a medicine that is put into the vagina (suppository). If the condition comes back after treatment, a second round of antibiotics may be needed. Follow these instructions at home: Medicines  Take over-the-counter and prescription medicines only as told by your health care provider.  Take or use your antibiotic as told by your health care provider. Do not stop taking or using the antibiotic even if you start to feel better. General instructions  If you have a female sexual partner, tell her that you have a vaginal infection. She should see her health care provider and be treated if she has symptoms. If you have a female sexual partner, he does not need treatment.  During treatment: ? Avoid sexual activity until you finish treatment. ? Do not douche. ? Avoid alcohol as directed by your health care provider. ? Avoid breastfeeding as directed by your health care provider.  Drink enough water and fluids to keep your urine clear or pale yellow.  Keep the area around your vagina and rectum clean. ? Wash the area daily with warm water. ? Wipe yourself from front to back after using the toilet.  Keep all follow-up visits as told by your health care provider. This is important. How is this prevented?  Do not douche.  Wash the outside of your vagina with warm water only.  Use protection when having sex. This includes latex condoms and dental dams.  Limit how many sexual partners you have. To help prevent bacterial vaginosis, it is best to have sex with just   one partner (monogamous).  Make sure you and your sexual partner are tested for STIs.  Wear cotton or cotton-lined underwear.  Avoid wearing tight pants and pantyhose, especially during summer.  Limit the amount of alcohol that you drink.  Do not use any products that  contain nicotine or tobacco, such as cigarettes and e-cigarettes. If you need help quitting, ask your health care provider.  Do not use illegal drugs. Where to find more information:  Centers for Disease Control and Prevention: www.cdc.gov/std  American Sexual Health Association (ASHA): www.ashastd.org  U.S. Department of Health and Human Services, Office on Women's Health: www.womenshealth.gov/ or https://www.womenshealth.gov/a-z-topics/bacterial-vaginosis Contact a health care provider if:  Your symptoms do not improve, even after treatment.  You have more discharge or pain when urinating.  You have a fever.  You have pain in your abdomen.  You have pain during sex.  You have vaginal bleeding between periods. Summary  Bacterial vaginosis is a vaginal infection that occurs when the normal balance of bacteria in the vagina is disrupted.  Because bacterial vaginosis increases your risk for STIs (sexually transmitted infections), getting treated can help reduce your risk for chlamydia, gonorrhea, herpes, and HIV (human immunodeficiency virus). Treatment is also important for preventing complications in pregnant women, because the condition can cause an early (premature) delivery.  This condition is treated with antibiotic medicines. These may be given as a pill, a vaginal cream, or a medicine that is put into the vagina (suppository). This information is not intended to replace advice given to you by your health care provider. Make sure you discuss any questions you have with your health care provider. Document Released: 11/11/2005 Document Revised: 03/17/2017 Document Reviewed: 07/27/2016 Elsevier Interactive Patient Education  2018 Elsevier Inc.  

## 2018-05-20 NOTE — MAU Note (Signed)
Pt presents to MAU with complaints of white vaginal discharge with a foul odor for a couple of days. PT states she does have lower abdominal cramping at times but denies any pain at present

## 2018-05-20 NOTE — MAU Provider Note (Signed)
History     CSN: [id number]  Arrival date and time: 05/20/18 1613   First Provider Initiated Contact with Patient 05/20/18 1701      Chief Complaint  Patient presents with  . Vaginal Discharge  . Possible Pregnancy   HPI Stephanie Reid is a 27 y.o. female who presents with vaginal discharge. States she doesn't know how long she has had discharge but first noticed foul odor associated with it today. She has been with new partner x 3 months and does not use condoms. Wants STD testing today.  Reports abdominal pain & diarrhea to RN but denies to me.  Denies abdominal pain, dysuria, vaginal bleeding, fever/chills, dyspareunia, or postcoital bleeding.   Past Medical History:  Diagnosis Date  . Hx of gonorrhea   . Hx of trichomoniasis     Past Surgical History:  Procedure Laterality Date  . NO PAST SURGERIES      Family History  Problem Relation Age of Onset  . Hypertension Mother   . Heart disease Mother   . Stroke Mother   . Diabetes Maternal Grandmother     Social History   Tobacco Use  . Smoking status: Current Every Day Smoker    Packs/day: 0.10    Types: Cigarettes  . Smokeless tobacco: Never Used  Substance Use Topics  . Alcohol use: Yes    Comment: on weekends  . Drug use: No    Allergies:  Allergies  Allergen Reactions  . Apple Anaphylaxis  . Banana Anaphylaxis  . Orange Fruit [Citrus] Anaphylaxis  . Lactase-Lactobacillus Other (See Comments)    hemmorrhoids  . Spinach Diarrhea  . Vicodin [Hydrocodone-Acetaminophen] Nausea And Vomiting    Medications Prior to Admission  Medication Sig Dispense Refill Last Dose  . cetirizine (ZYRTEC ALLERGY) 10 MG tablet Take 1 tablet (10 mg total) by mouth daily. 30 tablet 1   . fluticasone (FLONASE) 50 MCG/ACT nasal spray Place 2 sprays into both nostrils daily. 9.9 g 2   . ibuprofen (ADVIL,MOTRIN) 600 MG tablet Take 1 tablet (600 mg total) by mouth every 6 (six) hours as needed. 30 tablet 0   . penicillin  v potassium (VEETID) 500 MG tablet Take 1 tablet (500 mg total) by mouth 3 (three) times daily. 30 tablet 0     Review of Systems  Constitutional: Negative.   Gastrointestinal: Negative.   Genitourinary: Positive for vaginal discharge. Negative for dyspareunia, dysuria and vaginal bleeding.   Physical Exam   Blood pressure 129/78, pulse 96, temperature 99 F (37.2 C), resp. rate 18, height 5\' 6"  (1.676 m), weight 206 lb (93.4 kg), last menstrual period 12/20/2006.  Physical Exam  Nursing note and vitals reviewed. Constitutional: She is oriented to person, place, and time. She appears well-developed and well-nourished. No distress.  HENT:  Head: Normocephalic and atraumatic.  Eyes: Conjunctivae are normal. Right eye exhibits no discharge. Left eye exhibits no discharge. No scleral icterus.  Neck: Normal range of motion.  Respiratory: Effort normal. No respiratory distress.  GI: Soft. Bowel sounds are normal. There is no tenderness.  Genitourinary: There is lesion (~1cm lesion with ulteration) on the right labia. There is no lesion on the left labia. Cervix exhibits no motion tenderness and no friability. No bleeding in the vagina. Vaginal discharge found.  Neurological: She is alert and oriented to person, place, and time.  Skin: Skin is warm and dry. She is not diaphoretic.  Psychiatric: She has a normal mood and affect. Her behavior is normal. Judgment  and thought content normal.    MAU Course  Procedures Results for orders placed or performed during the hospital encounter of 05/20/18 (from the past 24 hour(s))  Urinalysis, Routine w reflex microscopic     Status: Abnormal   Collection Time: 05/20/18  4:44 PM  Result Value Ref Range   Color, Urine YELLOW YELLOW   APPearance CLEAR CLEAR   Specific Gravity, Urine 1.018 1.005 - 1.030   pH 5.0 5.0 - 8.0   Glucose, UA NEGATIVE NEGATIVE mg/dL   Hgb urine dipstick NEGATIVE NEGATIVE   Bilirubin Urine NEGATIVE NEGATIVE   Ketones, ur  NEGATIVE NEGATIVE mg/dL   Protein, ur NEGATIVE NEGATIVE mg/dL   Nitrite NEGATIVE NEGATIVE   Leukocytes, UA TRACE (A) NEGATIVE   RBC / HPF 0-5 0 - 5 RBC/hpf   WBC, UA 0-5 0 - 5 WBC/hpf   Bacteria, UA NONE SEEN NONE SEEN   Squamous Epithelial / LPF 0-5 0 - 5   Mucus PRESENT   Pregnancy, urine POC     Status: None   Collection Time: 05/20/18  4:44 PM  Result Value Ref Range   Preg Test, Ur NEGATIVE NEGATIVE  Wet prep, genital     Status: Abnormal   Collection Time: 05/20/18  5:18 PM  Result Value Ref Range   Yeast Wet Prep HPF POC NONE SEEN NONE SEEN   Trich, Wet Prep NONE SEEN NONE SEEN   Clue Cells Wet Prep HPF POC PRESENT (A) NONE SEEN   WBC, Wet Prep HPF POC NONE SEEN NONE SEEN   Sperm NONE SEEN     MDM UPT negative Pt desires full STD testing HSV culture collected from lesion on right labia Assessment and Plan  A: 1. BV (bacterial vaginosis)   2. Screen for STD (sexually transmitted disease)    P: Discharge home Offered rx flagyl --- pt states she has some left over at home & would like to use that -- discussed that would not be the appropriate dosing but states she wants to take it anyway & declines rx GC/CT, HIV, RPR, HBV, HCV pending HSV pending  Stephanie Reid 05/20/2018, 5:01 PM

## 2018-05-20 NOTE — MAU Note (Addendum)
Pt states she has had nausea & diarrhea for the last 2 weeks.  Usually has 3 diarrhea stools a day. Also states she is having LLQ cramping that radiates into her mid lower abdomen.

## 2018-05-21 LAB — GC/CHLAMYDIA PROBE AMP (~~LOC~~) NOT AT ARMC
Chlamydia: NEGATIVE
Neisseria Gonorrhea: NEGATIVE

## 2018-05-21 LAB — RPR: RPR: NONREACTIVE

## 2018-05-21 LAB — HIV ANTIBODY (ROUTINE TESTING W REFLEX): HIV Screen 4th Generation wRfx: NONREACTIVE

## 2018-05-22 LAB — HSV CULTURE AND TYPING

## 2018-05-22 LAB — HEPATITIS C VRS RNA DETECT BY PCR-QUAL: Hepatitis C Vrs RNA by PCR-Qual: NEGATIVE

## 2018-08-19 ENCOUNTER — Inpatient Hospital Stay (HOSPITAL_COMMUNITY)
Admission: AD | Admit: 2018-08-19 | Discharge: 2018-08-19 | Disposition: A | Discharge status: Home / Self Care | Payer: Self-pay | Source: Ambulatory Visit | Attending: Obstetrics and Gynecology | Admitting: Obstetrics and Gynecology

## 2018-08-19 ENCOUNTER — Ambulatory Visit (INDEPENDENT_AMBULATORY_CARE_PROVIDER_SITE_OTHER): Payer: Self-pay | Admitting: Family Medicine

## 2018-08-19 ENCOUNTER — Encounter: Payer: Self-pay | Admitting: Family Medicine

## 2018-08-19 VITALS — BP 114/71 | HR 71 | Wt 202.0 lb

## 2018-08-19 DIAGNOSIS — B9689 Other specified bacterial agents as the cause of diseases classified elsewhere: Secondary | ICD-10-CM

## 2018-08-19 DIAGNOSIS — Z113 Encounter for screening for infections with a predominantly sexual mode of transmission: Secondary | ICD-10-CM

## 2018-08-19 DIAGNOSIS — N898 Other specified noninflammatory disorders of vagina: Secondary | ICD-10-CM

## 2018-08-19 DIAGNOSIS — N3000 Acute cystitis without hematuria: Secondary | ICD-10-CM

## 2018-08-19 DIAGNOSIS — N760 Acute vaginitis: Secondary | ICD-10-CM

## 2018-08-19 LAB — POCT URINALYSIS DIP (DEVICE)
Bilirubin Urine: NEGATIVE
GLUCOSE, UA: NEGATIVE mg/dL
Hgb urine dipstick: NEGATIVE
Ketones, ur: NEGATIVE mg/dL
NITRITE: NEGATIVE
PH: 5.5 (ref 5.0–8.0)
PROTEIN: NEGATIVE mg/dL
Specific Gravity, Urine: 1.01 (ref 1.005–1.030)
Urobilinogen, UA: 0.2 mg/dL (ref 0.0–1.0)

## 2018-08-19 MED ORDER — NITROFURANTOIN MONOHYD MACRO 100 MG PO CAPS: 100.0000 mg | ORAL_CAPSULE | Freq: Two times a day (BID) | ORAL | 0 refills | Status: AC

## 2018-08-19 NOTE — MAU Provider Note (Signed)
Medical screen done.  Patient here for vaginal irritation. Offered patient a visit in the walk in clinic tonight. Patient agreeable [facility]   Duane Lopeasch, Ronith Berti I, NP 08/19/2018 7:21 PM

## 2018-08-19 NOTE — Patient Instructions (Signed)

## 2018-08-19 NOTE — Progress Notes (Signed)
   GYNECOLOGY OFFICE VISIT NOTE History:  27 y.o. G0P0000 here today for vaginal discharge and dysuira.   - 2 weeks - irritation, bad smell, yellow/white discharge - pain with peeing  - last period 10 years ago, Depo - needs pills  - never pregnant before  - two sexual partners, no new  - history of gonorrhea, chlamydia, trich, BV, no syphilis/HIV   The following portions of the patient's history were reviewed and updated as appropriate: allergies, current medications, past family history, past medical history, past social history, past surgical history and problem list.   Review of Systems:  Pertinent items noted in HPI ROS  Objective:  Physical Exam BP 114/71   Pulse 71   Wt 202 lb (91.6 kg)   BMI 32.60 kg/m  Physical Exam  Constitutional: She is oriented to person, place, and time. She appears well-developed and well-nourished. No distress.  HENT:  Head: Normocephalic and atraumatic.  Mouth/Throat: No oropharyngeal exudate.  Eyes: Conjunctivae and EOM are normal. No scleral icterus.  Cardiovascular: Intact distal pulses.  Pulmonary/Chest: No respiratory distress.  Abdominal: Soft. Bowel sounds are normal. There is no tenderness.  Genitourinary: Uterus normal. Vaginal discharge (moderate amount of white/thin ) found.  Neurological: She is alert and oriented to person, place, and time.  Psychiatric: She has a normal mood and affect. Her behavior is normal.  Nursing note and vitals reviewed.   Labs and Imaging Results for orders placed or performed in visit on 08/19/18 (from the past 168 hour(s))  Cervicovaginal ancillary only   Collection Time: 08/19/18 12:00 AM  Result Value Ref Range   Bacterial vaginitis **POSITIVE for Gardnerella vaginalis** (A)    Candida vaginitis Negative for Candida species    Chlamydia Negative    Neisseria gonorrhea Negative    Trichomonas Negative   Urine Culture   Collection Time: 08/19/18  7:45 AM  Result Value Ref Range   Urine  Culture, Routine Final report    Organism ID, Bacteria No growth   POCT urinalysis dip (device)   Collection Time: 08/19/18  7:35 PM  Result Value Ref Range   Glucose, UA NEGATIVE NEGATIVE mg/dL   Bilirubin Urine NEGATIVE NEGATIVE   Ketones, ur NEGATIVE NEGATIVE mg/dL   Specific Gravity, Urine 1.010 1.005 - 1.030   Hgb urine dipstick NEGATIVE NEGATIVE   pH 5.5 5.0 - 8.0   Protein, ur NEGATIVE NEGATIVE mg/dL   Urobilinogen, UA 0.2 0.0 - 1.0 mg/dL   Nitrite NEGATIVE NEGATIVE   Leukocytes, UA SMALL (A) NEGATIVE   No results found.  Assessment & Plan:   27yo G0 who presents with dysuria and vaginal discharge. Wet prep notable for bacterial vaginosis and U/A with small leukocytes only. Decision made to empirically treat infection given symptoms with Macrobid. Urine culture returned with no growth but patient to complete course of treatment. Given also with bacterial vaginosis on wet prep, will treat with vaginal metronidazole. Encouraged consistent condom use and encouraged to return for annual visit.   Return in about 1 month (around 09/18/2018) for annual GYN visit, amenorrhea .  Cristal Deer. Earlene Plater, DO OB Family Medicine Fellow, [facility] for [employer], [facility]

## 2018-08-20 LAB — URINE CULTURE: Organism ID, Bacteria: NO GROWTH

## 2018-08-21 LAB — CERVICOVAGINAL ANCILLARY ONLY
Bacterial vaginitis: POSITIVE — AB
Candida vaginitis: NEGATIVE
Chlamydia: NEGATIVE
Neisseria Gonorrhea: NEGATIVE
Trichomonas: NEGATIVE

## 2018-08-24 MED ORDER — METRONIDAZOLE 0.75 % VA GEL: 1.0000 | Freq: Two times a day (BID) | VAGINAL | 0 refills | Status: AC

## 2019-01-19 ENCOUNTER — Emergency Department (HOSPITAL_COMMUNITY)
Admission: EM | Admit: 2019-01-19 | Discharge: 2019-01-19 | Disposition: A | Discharge status: Home / Self Care | Payer: Self-pay | Attending: Emergency Medicine | Admitting: Emergency Medicine

## 2019-01-19 ENCOUNTER — Encounter (HOSPITAL_COMMUNITY): Payer: Self-pay | Admitting: Emergency Medicine

## 2019-01-19 DIAGNOSIS — F17210 Nicotine dependence, cigarettes, uncomplicated: Secondary | ICD-10-CM | POA: Insufficient documentation

## 2019-01-19 DIAGNOSIS — Z79899 Other long term (current) drug therapy: Secondary | ICD-10-CM | POA: Insufficient documentation

## 2019-01-19 DIAGNOSIS — M791 Myalgia, unspecified site: Secondary | ICD-10-CM | POA: Insufficient documentation

## 2019-01-19 LAB — CBC WITH DIFFERENTIAL/PLATELET
ABS IMMATURE GRANULOCYTES: 0.02 10*3/uL (ref 0.00–0.07)
Basophils Absolute: 0 10*3/uL (ref 0.0–0.1)
Basophils Relative: 0 %
Eosinophils Absolute: 0.4 10*3/uL (ref 0.0–0.5)
Eosinophils Relative: 4 %
HCT: 36.9 % (ref 36.0–46.0)
Hemoglobin: 11.8 g/dL — ABNORMAL LOW (ref 12.0–15.0)
IMMATURE GRANULOCYTES: 0 %
Lymphocytes Relative: 22 %
Lymphs Abs: 2.3 10*3/uL (ref 0.7–4.0)
MCH: 29.1 pg (ref 26.0–34.0)
MCHC: 32 g/dL (ref 30.0–36.0)
MCV: 90.9 fL (ref 80.0–100.0)
MONOS PCT: 6 %
Monocytes Absolute: 0.6 10*3/uL (ref 0.1–1.0)
NEUTROS PCT: 68 %
Neutro Abs: 6.8 10*3/uL (ref 1.7–7.7)
Platelets: 250 10*3/uL (ref 150–400)
RBC: 4.06 MIL/uL (ref 3.87–5.11)
RDW: 12.2 % (ref 11.5–15.5)
WBC: 10.1 10*3/uL (ref 4.0–10.5)
nRBC: 0 % (ref 0.0–0.2)

## 2019-01-19 LAB — COMPREHENSIVE METABOLIC PANEL
ALK PHOS: 64 U/L (ref 38–126)
ALT: 35 U/L (ref 0–44)
AST: 28 U/L (ref 15–41)
Albumin: 3.6 g/dL (ref 3.5–5.0)
Anion gap: 11 (ref 5–15)
BILIRUBIN TOTAL: 0.2 mg/dL — AB (ref 0.3–1.2)
BUN: 8 mg/dL (ref 6–20)
CHLORIDE: 104 mmol/L (ref 98–111)
CO2: 23 mmol/L (ref 22–32)
CREATININE: 0.76 mg/dL (ref 0.44–1.00)
Calcium: 9.4 mg/dL (ref 8.9–10.3)
GFR calc Af Amer: >60 mL/min (ref >60)
Glucose, Bld: 102 mg/dL — ABNORMAL HIGH (ref 70–99)
Potassium: 3.8 mmol/L (ref 3.5–5.1)
Sodium: 138 mmol/L (ref 135–145)
Total Protein: 7.1 g/dL (ref 6.5–8.1)

## 2019-01-19 LAB — I-STAT BETA HCG BLOOD, ED (MC, WL, AP ONLY): I-stat hCG, quantitative: <5 m[IU]/mL (ref <5)

## 2019-01-19 LAB — LIPASE, BLOOD: LIPASE: 30 U/L (ref 11–51)

## 2019-01-19 MED ORDER — METHOCARBAMOL 500 MG PO TABS: 1000.0000 mg | ORAL_TABLET | Freq: Three times a day (TID) | ORAL | 0 refills | Status: DC | PRN

## 2019-01-19 MED ORDER — METHOCARBAMOL 500 MG PO TABS
1000.0000 mg | ORAL_TABLET | Freq: Once | ORAL | Status: AC
  Administered 2019-01-19: 1000 mg via ORAL
  Filled 2019-01-19 (×2): qty 2

## 2019-01-19 MED ORDER — IBUPROFEN 400 MG PO TABS
600.0000 mg | ORAL_TABLET | Freq: Once | ORAL | Status: AC
  Administered 2019-01-19: 18:00:00 600 mg via ORAL
  Filled 2019-01-19: qty 1

## 2019-01-19 MED ORDER — IBUPROFEN 600 MG PO TABS: 600.0000 mg | ORAL_TABLET | Freq: Four times a day (QID) | ORAL | 0 refills | Status: DC | PRN

## 2019-01-19 NOTE — ED Triage Notes (Signed)
Pt reports right sided back pain x 4 days, denies injury.

## 2019-01-19 NOTE — ED Notes (Signed)
Discharge instructions and prescriptions discussed with Pt. Pt verbalized understanding. Pt stable and ambulatory.   

## 2019-01-19 NOTE — ED Provider Notes (Signed)
[facility] EMERGENCY DEPARTMENT Provider Note   CSN: [id number] Arrival date & time: 01/19/19  1513    History   Chief Complaint Chief Complaint  Patient presents with  . Back Pain    HPI Stephanie Reid is a 28 y.o. female.     HPI Patient presents with 4 days of generalized back pain.  No known trauma.  No fever or chills.  No focal weakness or numbness.  Pain is worse with movement.  No radiation to the legs.  Denies abdominal pain.  No vomiting or diarrhea.  Denies urinary symptoms including urinary frequency, hematuria, dysuria.  Denies shortness of breath or chest pain.  Patient states she had similar symptoms when she had strep throat in the past.  She is denying any sore throat. Past Medical History:  Diagnosis Date  . Hx of gonorrhea   . Hx of trichomoniasis     Patient Active Problem List   Diagnosis Date Noted  . Amenorrhea, secondary 10/06/2013    Past Surgical History:  Procedure Laterality Date  . NO PAST SURGERIES       OB History    Gravida  0   Para  0   Term  0   Preterm  0   AB  0   Living  0     SAB  0   TAB  0   Ectopic  0   Multiple  0   Live Births  0            Home Medications    Prior to Admission medications   Medication Sig Start Date End Date Taking? Authorizing Provider  cetirizine (ZYRTEC ALLERGY) 10 MG tablet Take 1 tablet (10 mg total) by mouth daily. Patient not taking: Reported on 01/19/2019 10/23/17   Everlene Farrier, PA-C  fluticasone [facility]) 50 MCG/ACT nasal spray Place 2 sprays into both nostrils daily. Patient not taking: Reported on 01/19/2019 10/23/17   Muthersbaugh, Dahlia Client, PA-C  ibuprofen (ADVIL,MOTRIN) 600 MG tablet Take 1 tablet (600 mg total) by mouth every 6 (six) hours as needed. 01/19/19   Loren Racer, MD  methocarbamol (ROBAXIN) 500 MG tablet Take 2 tablets (1,000 mg total) by mouth every 8 (eight) hours as needed for muscle spasms. 01/19/19   Loren Racer, MD      Family History Family History  Problem Relation Age of Onset  . Hypertension Mother   . Heart disease Mother   . Stroke Mother   . Diabetes Maternal Grandmother     Social History Social History   Tobacco Use  . Smoking status: Current Every Day Smoker    Packs/day: 0.10    Types: Cigarettes  . Smokeless tobacco: Never Used  Substance Use Topics  . Alcohol use: Yes    Comment: on weekends  . Drug use: No     Allergies   Apple; Banana; Orange fruit [citrus]; Lactase-lactobacillus; Spinach; and Vicodin [hydrocodone-acetaminophen]   Review of Systems Review of Systems  Constitutional: Negative for chills and fever.  Respiratory: Negative for cough and shortness of breath.   Cardiovascular: Negative for chest pain.  Gastrointestinal: Negative for abdominal pain, constipation, diarrhea, nausea and vomiting.  Genitourinary: Negative for dysuria, flank pain, frequency, hematuria and pelvic pain.  Musculoskeletal: Positive for back pain and myalgias. Negative for arthralgias, joint swelling and neck pain.  Skin: Negative for rash and wound.  Neurological: Negative for dizziness, weakness, light-headedness, numbness and headaches.  All other systems reviewed and are negative.  Physical Exam Updated Vital Signs BP (!) 102/53 (BP Location: Right Arm)   Pulse 80   Temp 98.9 F (37.2 C) (Oral)   Resp 16   SpO2 100%   Physical Exam Vitals signs and nursing note reviewed.  Constitutional:      Appearance: Normal appearance. She is well-developed.  HENT:     Head: Normocephalic and atraumatic.     Nose: Nose normal.     Mouth/Throat:     Mouth: Mucous membranes are moist.     Pharynx: No oropharyngeal exudate or posterior oropharyngeal erythema.  Eyes:     Extraocular Movements: Extraocular movements intact.     Pupils: Pupils are equal, round, and reactive to light.  Neck:     Musculoskeletal: Normal range of motion and neck supple. No neck rigidity or  muscular tenderness.  Cardiovascular:     Rate and Rhythm: Normal rate and regular rhythm.     Heart sounds: No murmur. No friction rub. No gallop.   Pulmonary:     Effort: Pulmonary effort is normal. No respiratory distress.     Breath sounds: Normal breath sounds. No stridor. No wheezing, rhonchi or rales.  Chest:     Chest wall: No tenderness.  Abdominal:     General: Bowel sounds are normal. There is no distension.     Palpations: Abdomen is soft.     Tenderness: There is no abdominal tenderness. There is no right CVA tenderness, left CVA tenderness, guarding or rebound.  Musculoskeletal: Normal range of motion.        General: Tenderness present. No swelling, deformity or signs of injury.     Right lower leg: No edema.     Left lower leg: No edema.     Comments: Patient with diffuse muscular tenderness of the thoracic and lumbar back.  No definite CVA tenderness.  No point midline thoracic or lumbar tenderness.  No step-offs or deformities.  Distal pulses are 2+.  No lower extremity swelling, asymmetry or tenderness.  Lymphadenopathy:     Cervical: No cervical adenopathy.  Skin:    General: Skin is warm and dry.     Capillary Refill: Capillary refill takes less than 2 seconds.     Findings: No erythema or rash.  Neurological:     General: No focal deficit present.     Mental Status: She is alert and oriented to person, place, and time.     Comments: 5/5 motor in all extremities.  Sensation fully intact.  Psychiatric:        Mood and Affect: Mood normal.        Behavior: Behavior normal.      ED Treatments / Results  Labs (all labs ordered are listed, but only abnormal results are displayed) Labs Reviewed  CBC WITH DIFFERENTIAL/PLATELET - Abnormal; Notable for the following components:      Result Value   Hemoglobin 11.8 (*)    All other components within normal limits  COMPREHENSIVE METABOLIC PANEL - Abnormal; Notable for the following components:   Glucose, Bld 102  (*)    Total Bilirubin 0.2 (*)    All other components within normal limits  LIPASE, BLOOD  URINALYSIS, ROUTINE W REFLEX MICROSCOPIC  I-STAT BETA HCG BLOOD, ED ([facility], [facility], [facility] ONLY)    EKG None  Radiology No results found.  Procedures Procedures (including critical care time)  Medications Ordered in ED Medications  methocarbamol (ROBAXIN) tablet 1,000 mg (1,000 mg Oral Given 01/19/19 1829)  ibuprofen (ADVIL,MOTRIN) tablet  600 mg (600 mg Oral Given 01/19/19 1829)     Initial Impression / Assessment and Plan / ED Course  I have reviewed the triage vital signs and the nursing notes.  Pertinent labs & imaging results that were available during my care of the patient were reviewed by me and considered in my medical decision making (see chart for details).       Laboratory work-up is essentially normal.  Patient symptoms are generalized myalgias.  Possible viral illness.  Will treat symptomatically.  Return precautions given.   Final Clinical Impressions(s) / ED Diagnoses   Final diagnoses:  Myalgia    ED Discharge Orders         Ordered    ibuprofen (ADVIL,MOTRIN) 600 MG tablet  Every 6 hours PRN     01/19/19 1841    methocarbamol (ROBAXIN) 500 MG tablet  Every 8 hours PRN     01/19/19 1841           Loren Racer, MD 01/19/19 1842

## 2019-01-19 NOTE — ED Notes (Signed)
Pt aware that a urine sample is needed, states she can not go right now and does not understand why one is needed. This RN explained that we need to check for an infection etc.

## 2019-03-31 ENCOUNTER — Other Ambulatory Visit: Payer: Self-pay

## 2019-03-31 ENCOUNTER — Encounter (HOSPITAL_COMMUNITY): Payer: Self-pay | Admitting: Emergency Medicine

## 2019-03-31 ENCOUNTER — Ambulatory Visit (HOSPITAL_COMMUNITY)
Admission: EM | Admit: 2019-03-31 | Discharge: 2019-03-31 | Disposition: A | Discharge status: Home / Self Care | Payer: Self-pay | Attending: Family Medicine | Admitting: Family Medicine

## 2019-03-31 DIAGNOSIS — J320 Chronic maxillary sinusitis: Secondary | ICD-10-CM

## 2019-03-31 MED ORDER — AMOXICILLIN-POT CLAVULANATE 875-125 MG PO TABS: 1.0000 | ORAL_TABLET | Freq: Two times a day (BID) | ORAL | 0 refills | Status: AC

## 2019-03-31 MED ORDER — FLUTICASONE PROPIONATE 50 MCG/ACT NA SUSP: 2.0000 | Freq: Every day | NASAL | 0 refills | Status: DC

## 2019-03-31 NOTE — ED Provider Notes (Signed)
[facility]    CSN: [id number] Arrival date & time: 03/31/19  1546     History   Chief Complaint Chief Complaint  Patient presents with  . Cough  . Generalized Body Aches    HPI Stephanie Reid is a 28 y.o. female.   Stephanie Reid presents with complaints of body aches, nausea due to significant cough, no vomiting. Sore throat. Unknown fevers. Chills and sweats. Cough which is productive. No ear pain. No rash. No abdominal pain. No shortness of breath . Loss of voice intermittently. Has had similar with URI as well as with strep. No known ill contacts. Works at [employer], but has been out for the past 3 days. Sore throat for 2 weeks. Sinus drainage and facial pressure. Body aches and cough for 3 days. No asthma. Smokes 3-4 cigarettes a day. No medications for symptoms. No shortness of breath . No chest pain . Without contributing medical history.      ROS per HPI, negative if not otherwise mentioned.      Past Medical History:  Diagnosis Date  . Hx of gonorrhea   . Hx of trichomoniasis     Patient Active Problem List   Diagnosis Date Noted  . Amenorrhea, secondary 10/06/2013    Past Surgical History:  Procedure Laterality Date  . NO PAST SURGERIES      OB History    Gravida  0   Para  0   Term  0   Preterm  0   AB  0   Living  0     SAB  0   TAB  0   Ectopic  0   Multiple  0   Live Births  0            Home Medications    Prior to Admission medications   Medication Sig Start Date End Date Taking? Authorizing Provider  amoxicillin-clavulanate (AUGMENTIN) 875-125 MG tablet Take 1 tablet by mouth every 12 (twelve) hours for 10 days. 03/31/19 04/10/19  Georgetta Haber, NP  cetirizine (ZYRTEC ALLERGY) 10 MG tablet Take 1 tablet (10 mg total) by mouth daily. Patient not taking: Reported on 01/19/2019 10/23/17   Everlene Farrier, PA-C  fluticasone [facility]) 50 MCG/ACT nasal spray Place 2 sprays into both nostrils daily. 03/31/19    Georgetta Haber, NP  ibuprofen (ADVIL,MOTRIN) 600 MG tablet Take 1 tablet (600 mg total) by mouth every 6 (six) hours as needed. 01/19/19   Loren Racer, MD  methocarbamol (ROBAXIN) 500 MG tablet Take 2 tablets (1,000 mg total) by mouth every 8 (eight) hours as needed for muscle spasms. 01/19/19   Loren Racer, MD    Family History Family History  Problem Relation Age of Onset  . Hypertension Mother   . Heart disease Mother   . Stroke Mother   . Diabetes Maternal Grandmother     Social History Social History   Tobacco Use  . Smoking status: Current Every Day Smoker    Packs/day: 0.10    Types: Cigarettes  . Smokeless tobacco: Never Used  Substance Use Topics  . Alcohol use: Yes    Comment: on weekends  . Drug use: No     Allergies   Apple; Banana; Orange fruit [citrus]; Lactase-lactobacillus; Spinach; and Vicodin [hydrocodone-acetaminophen]   Review of Systems Review of Systems   Physical Exam Triage Vital Signs ED Triage Vitals [03/31/19 1603]  Enc Vitals Group     BP (!) 130/94  Pulse Rate 82     Resp 18     Temp 98.3 F (36.8 C)     Temp Source Oral     SpO2 97 %     Weight      Height      Head Circumference      Peak Flow      Pain Score 6     Pain Loc      Pain Edu?      Excl. in GC?    No data found.  Updated Vital Signs BP (!) 130/94 (BP Location: Right Arm)   Pulse 82   Temp 98.3 F (36.8 C) (Oral)   Resp 18   SpO2 97%    Physical Exam Constitutional:      General: She is not in acute distress.    Appearance: She is well-developed.  HENT:     Head: Normocephalic and atraumatic.     Right Ear: Tympanic membrane, ear canal and external ear normal.     Left Ear: Tympanic membrane, ear canal and external ear normal.     Nose: Rhinorrhea present.     Left Sinus: Maxillary sinus tenderness present.     Mouth/Throat:     Mouth: Mucous membranes are moist.     Pharynx: Uvula midline.     Tonsils: No tonsillar exudate. 1+ on  the right. 1+ on the left.  Eyes:     Conjunctiva/sclera: Conjunctivae normal.     Pupils: Pupils are equal, round, and reactive to light.  Cardiovascular:     Rate and Rhythm: Normal rate and regular rhythm.     Heart sounds: Normal heart sounds.  Pulmonary:     Effort: Pulmonary effort is normal.     Breath sounds: Normal breath sounds.     Comments: Occasional congested cough noted  Skin:    General: Skin is warm and dry.  Neurological:     Mental Status: She is alert and oriented to person, place, and time.      UC Treatments / Results  Labs (all labs ordered are listed, but only abnormal results are displayed) Labs Reviewed - No data to display  EKG None  Radiology No results found.  Procedures Procedures (including critical care time)  Medications Ordered in UC Medications - No data to display  Initial Impression / Assessment and Plan / UC Course  I have reviewed the triage vital signs and the nursing notes.  Pertinent labs & imaging results that were available during my care of the patient were reviewed by me and considered in my medical decision making (see chart for details).     Non toxic. Afebrile. Will cover for sinusitis as sore throat has been ongoing for 2 weeks now. Due to Covid-19 pandemic, patient works at [employer], I do recommend self-isolation. Return precautions provided. Patient verbalized understanding and agreeable to plan.  Final Clinical Impressions(s) / UC Diagnoses   Final diagnoses:  Chronic maxillary sinusitis     Discharge Instructions     If symptoms worsen or do not improve in the next week to return to be seen or to follow up with PCP.  Complete course of antibiotics.  Daily nasal spray.  Due to pandemic still recommend self isolation for 7 days or until symptom improvement and no fever for 3 days, whichever is longer.  If any worsening of symptoms, chest pain , shortness of breath  or otherwise worsening please go to the ER.  ED Prescriptions    Medication Sig Dispense Auth. Provider   amoxicillin-clavulanate (AUGMENTIN) 875-125 MG tablet Take 1 tablet by mouth every 12 (twelve) hours for 10 days. 20 tablet Linus Mako B, NP   fluticasone (FLONASE) 50 MCG/ACT nasal spray Place 2 sprays into both nostrils daily. 9.9 g Linus Mako B, NP     Controlled Substance Prescriptions [place] Controlled Substance Registry consulted? Not Applicable   Georgetta Haber, NP 03/31/19 1630

## 2019-03-31 NOTE — ED Triage Notes (Signed)
Pt sts cough, nasal congestion and body aches x 2 weeks; unsure of fever

## 2019-03-31 NOTE — Discharge Instructions (Signed)
If symptoms worsen or do not improve in the next week to return to be seen or to follow up with PCP.  Complete course of antibiotics.  Daily nasal spray.  Due to pandemic still recommend self isolation for 7 days or until symptom improvement and no fever for 3 days, whichever is longer.  If any worsening of symptoms, chest pain , shortness of breath  or otherwise worsening please go to the ER.

## 2019-04-29 ENCOUNTER — Ambulatory Visit (HOSPITAL_COMMUNITY)
Admission: EM | Admit: 2019-04-29 | Discharge: 2019-04-29 | Disposition: A | Discharge status: Home / Self Care | Payer: Self-pay | Attending: Family Medicine | Admitting: Family Medicine

## 2019-04-29 ENCOUNTER — Ambulatory Visit (INDEPENDENT_AMBULATORY_CARE_PROVIDER_SITE_OTHER): Payer: Self-pay | Admitting: Obstetrics and Gynecology

## 2019-04-29 ENCOUNTER — Encounter: Payer: Self-pay | Admitting: Obstetrics and Gynecology

## 2019-04-29 ENCOUNTER — Other Ambulatory Visit: Payer: Self-pay

## 2019-04-29 ENCOUNTER — Encounter (HOSPITAL_COMMUNITY): Payer: Self-pay

## 2019-04-29 VITALS — BP 114/72 | HR 75 | Temp 98.0°F | Ht 65.0 in | Wt 211.2 lb

## 2019-04-29 DIAGNOSIS — Z3202 Encounter for pregnancy test, result negative: Secondary | ICD-10-CM

## 2019-04-29 DIAGNOSIS — N760 Acute vaginitis: Secondary | ICD-10-CM

## 2019-04-29 DIAGNOSIS — N644 Mastodynia: Secondary | ICD-10-CM

## 2019-04-29 DIAGNOSIS — N911 Secondary amenorrhea: Secondary | ICD-10-CM

## 2019-04-29 LAB — POCT PREGNANCY, URINE: Preg Test, Ur: NEGATIVE

## 2019-04-29 LAB — POCT URINALYSIS DIP (DEVICE)
Bilirubin Urine: NEGATIVE
Glucose, UA: NEGATIVE mg/dL
Hgb urine dipstick: NEGATIVE
Ketones, ur: NEGATIVE mg/dL
Leukocytes,Ua: NEGATIVE
Nitrite: NEGATIVE
Protein, ur: NEGATIVE mg/dL
Specific Gravity, Urine: >=1.03 (ref 1.005–1.030)
Urobilinogen, UA: 0.2 mg/dL (ref 0.0–1.0)
pH: 6 (ref 5.0–8.0)

## 2019-04-29 MED ORDER — FLUCONAZOLE 150 MG PO TABS: 150.0000 mg | ORAL_TABLET | Freq: Once | ORAL | 0 refills | Status: DC

## 2019-04-29 MED ORDER — IBUPROFEN 800 MG PO TABS: 800.0000 mg | ORAL_TABLET | Freq: Three times a day (TID) | ORAL | 0 refills | Status: DC

## 2019-04-29 NOTE — Patient Instructions (Signed)

## 2019-04-29 NOTE — Discharge Instructions (Addendum)
Please follow-up with OB/GYN-contact for women[facility] clinic below for further evaluation of why you are not having menstrual cycles as well as update Pap smear  Use anti-inflammatories for breast soreness. You may take up to 800 mg Ibuprofen every 8 hours with food. You may supplement Ibuprofen with Tylenol [phone] mg every 8 hours.  Warm compresses to breasts  You may take 1 tablet of Diflucan to treat for yeast.  May repeat in 3 days if swab positive for yeast and still having symptoms.  We are testing you for Gonorrhea, Chlamydia, Trichomonas, Yeast and Bacterial Vaginosis. We will call you if anything is positive and let you know if you require any further treatment. Please inform partners of any positive results.   Please return if symptoms not improving with treatment, development of fever, nausea, vomiting, abdominal pain.

## 2019-04-29 NOTE — ED Triage Notes (Signed)
Pt c/o bilateral breast tenderness and vaginal discomfort x4 days. States hasn't had a menstrual cycle in 51yrs.

## 2019-04-29 NOTE — Progress Notes (Signed)
Ms Parillo presents for eval of amenorrhea x 10 yrs. Pt was Dx with Premature ovarian failure in 2015. See previous office note.   Pt reports no cycle for 10 yrs now. She reports breast tenderness, hot flashes and night sweats. She desire pregnancy  PE AF VSS Lungs clear  Heart RRR abd soft + BS  UPT negative  A/P Secondary Amenorrhea/Premature ovarian failure  Discussed Dx with pt. Will repeat labs and order GYN U/S Pt desires pregnancy. If Dx confirmed pt made aware will need to refer to [facility] for further eval and treatment Pt advised to go to [facility] for pap smear since she is self pay. F/U per test results.

## 2019-04-29 NOTE — ED Provider Notes (Signed)
[facility]    CSN: [id number] Arrival date & time: 04/29/19  1108     History   Chief Complaint Chief Complaint  Patient presents with  . breast tenderness    HPI Stephanie Reid is a 28 y.o. female history of amenorrhea, previous STDs presenting today for evaluation of bilateral breast soreness and vaginal irritation.  Patient states that over the past 4 days she has noticed her nipples being sore.  Tenderness is mainly around the nipple.  She denies any changes to the skin.  Denies any redness.  Denies current or previous breast-feeding.  Denies previous pregnancies.  She states that she previously used Depo-Provera, but stopped this in 2010 and never returned to having menstrual cycles.  She has not had a menstrual cycle in the past 10 years.  She has never had this evaluated.  She is tried progesterone withdrawals without bleeding.  She does endorse new partners and concern for STDs.  She has had vaginal irritation and states that it feels dry.  Notes a sensation vaginally as well as externally.  Denies any abnormal discharge.  Denies any rashes or lesions.  Patient has history of recurrent abscesses that she gets to her groin breasts and axilla.  HPI  Past Medical History:  Diagnosis Date  . Hx of gonorrhea   . Hx of trichomoniasis     Patient Active Problem List   Diagnosis Date Noted  . Amenorrhea, secondary 10/06/2013    Past Surgical History:  Procedure Laterality Date  . NO PAST SURGERIES      OB History    Gravida  0   Para  0   Term  0   Preterm  0   AB  0   Living  0     SAB  0   TAB  0   Ectopic  0   Multiple  0   Live Births  0            Home Medications    Prior to Admission medications   Medication Sig Start Date End Date Taking? Authorizing Provider  cetirizine (ZYRTEC ALLERGY) 10 MG tablet Take 1 tablet (10 mg total) by mouth daily. Patient not taking: Reported on 01/19/2019 10/23/17   Everlene Farrieransie, William, PA-C   fluconazole (DIFLUCAN) 150 MG tablet Take 1 tablet (150 mg total) by mouth once for 1 dose. 04/29/19 04/29/19  Sherrol Vicars C, PA-C  fluticasone (FLONASE) 50 MCG/ACT nasal spray Place 2 sprays into both nostrils daily. 03/31/19   Georgetta HaberBurky, Natalie B, NP  ibuprofen (ADVIL) 800 MG tablet Take 1 tablet (800 mg total) by mouth 3 (three) times daily. 04/29/19   Srinidhi Landers C, PA-C  methocarbamol (ROBAXIN) 500 MG tablet Take 2 tablets (1,000 mg total) by mouth every 8 (eight) hours as needed for muscle spasms. 01/19/19   Loren RacerYelverton, David, MD    Family History Family History  Problem Relation Age of Onset  . Hypertension Mother   . Heart disease Mother   . Stroke Mother   . Diabetes Maternal Grandmother     Social History Social History   Tobacco Use  . Smoking status: Current Every Day Smoker    Packs/day: 0.10    Types: Cigarettes  . Smokeless tobacco: Never Used  Substance Use Topics  . Alcohol use: Yes    Comment: on weekends  . Drug use: No     Allergies   Apple; Banana; Orange fruit [citrus]; Lactase-lactobacillus; Spinach; and Vicodin [hydrocodone-acetaminophen]  Review of Systems Review of Systems  Constitutional: Negative for fever.  Respiratory: Negative for shortness of breath.   Cardiovascular: Negative for chest pain.  Gastrointestinal: Negative for abdominal pain, diarrhea, nausea and vomiting.  Genitourinary: Positive for menstrual problem. Negative for dysuria, flank pain, genital sores, hematuria, vaginal bleeding, vaginal discharge and vaginal pain.  Musculoskeletal: Negative for back pain.  Skin: Negative for rash.  Neurological: Negative for dizziness, light-headedness and headaches.     Physical Exam Triage Vital Signs ED Triage Vitals  Enc Vitals Group     BP 04/29/19 1118 106/70     Pulse Rate 04/29/19 1118 80     Resp 04/29/19 1118 18     Temp 04/29/19 1118 98.3 F (36.8 C)     Temp Source 04/29/19 1118 Oral     SpO2 04/29/19 1118 97 %      Weight --      Height --      Head Circumference --      Peak Flow --      Pain Score 04/29/19 1119 0     Pain Loc --      Pain Edu? --      Excl. in [place]? --    No data found.  Updated Vital Signs BP 106/70 (BP Location: Right Arm)   Pulse 80   Temp 98.3 F (36.8 C) (Oral)   Resp 18   SpO2 97%   Visual Acuity Right Eye Distance:   Left Eye Distance:   Bilateral Distance:    Right Eye Near:   Left Eye Near:    Bilateral Near:     Physical Exam Vitals signs and nursing note reviewed.  Constitutional:      General: She is not in acute distress.    Appearance: She is well-developed.  HENT:     Head: Normocephalic and atraumatic.  Eyes:     Conjunctiva/sclera: Conjunctivae normal.  Neck:     Musculoskeletal: Neck supple.  Cardiovascular:     Rate and Rhythm: Normal rate and regular rhythm.     Heart sounds: No murmur.  Pulmonary:     Effort: Pulmonary effort is normal. No respiratory distress.     Breath sounds: Normal breath sounds.     Comments: Bilateral breast with no erythema or notable abnormality, there is an area of hyperpigmentation to left breast with overlying peeling that appears to be a healing abscess.  Tenderness diffusely throughout bilateral lower breast and around areola, no palpable masses  Abdominal:     Palpations: Abdomen is soft.     Tenderness: There is no abdominal tenderness.     Comments: Soft, nondistended, nontender likely palpation throughout abdomen  Genitourinary:    Comments: Normal external female genitalia, no tenderness to palpation of labia majora, multiple small skin colored bumps Vaginal mucosa pink, slightly thickened white discharge present, cervix without erythema, no cervical motion tenderness Nulliparous Skin:    General: Skin is warm and dry.  Neurological:     Mental Status: She is alert.      UC Treatments / Results  Labs (all labs ordered are listed, but only abnormal results are displayed) Labs Reviewed  POC  URINE PREG, ED  POCT URINALYSIS DIP (DEVICE)  POCT PREGNANCY, URINE  CERVICOVAGINAL ANCILLARY ONLY    EKG None  Radiology No results found.  Procedures Procedures (including critical care time)  Medications Ordered in UC Medications - No data to display  Initial Impression / Assessment and Plan / UC Course  I have reviewed the triage vital signs and the nursing notes.  Pertinent labs & imaging results that were available during my care of the patient were reviewed by me and considered in my medical decision making (see chart for details).     Symptoms feel like yeast according to patient, has documented history of BV.  Based off patient's symptoms will empirically treat for yeast today.  Pelvic exam not suggestive of STDs at this time, but will check on swab and provide further treatment if needed.  No masses or sign of infection as cause of breast soreness.  Likely more hormonal.  Will recommend NSAIDs and warm compresses.  Follow-up with OB/GYN for further evaluation of amenorrhea as well as to have Pap smear updated.  Discussed strict return precautions. Patient verbalized understanding and is agreeable with plan.  Final Clinical Impressions(s) / UC Diagnoses   Final diagnoses:  Breast tenderness in female  Vaginitis and vulvovaginitis     Discharge Instructions     Please follow-up with OB/GYN-contact for women's clinic below for further evaluation of why you are not having menstrual cycles as well as update Pap smear  Use anti-inflammatories for breast soreness. You may take up to 800 mg Ibuprofen every 8 hours with food. You may supplement Ibuprofen with Tylenol [phone] mg every 8 hours.  Warm compresses to breasts  You may take 1 tablet of Diflucan to treat for yeast.  May repeat in 3 days if swab positive for yeast and still having symptoms.  We are testing you for Gonorrhea, Chlamydia, Trichomonas, Yeast and Bacterial Vaginosis. We will call you if anything is  positive and let you know if you require any further treatment. Please inform partners of any positive results.   Please return if symptoms not improving with treatment, development of fever, nausea, vomiting, abdominal pain.     ED Prescriptions    Medication Sig Dispense Auth. Provider   ibuprofen (ADVIL) 800 MG tablet Take 1 tablet (800 mg total) by mouth 3 (three) times daily. 21 tablet Karie Skowron C, PA-C   fluconazole (DIFLUCAN) 150 MG tablet Take 1 tablet (150 mg total) by mouth once for 1 dose. 2 tablet Ricard Faulkner C, PA-C     Controlled Substance Prescriptions [place] Controlled Substance Registry consulted? Not Applicable   Lew Dawes, [place] 04/29/19 1149

## 2019-04-30 LAB — CERVICOVAGINAL ANCILLARY ONLY
Bacterial vaginitis: POSITIVE — AB
Candida vaginitis: NEGATIVE
Chlamydia: NEGATIVE
Neisseria Gonorrhea: NEGATIVE
Trichomonas: NEGATIVE

## 2019-05-03 ENCOUNTER — Telehealth (HOSPITAL_COMMUNITY): Payer: Self-pay | Admitting: Emergency Medicine

## 2019-05-03 LAB — ANTI MULLERIAN HORMONE: ANTI-MULLERIAN HORMONE (AMH): <0.015 ng/mL

## 2019-05-03 LAB — FOLLICLE STIMULATING HORMONE: FSH: 17.7 m[IU]/mL

## 2019-05-03 LAB — ESTRADIOL: Estradiol: 108.8 pg/mL

## 2019-05-03 LAB — LUTEINIZING HORMONE: LH: 22.8 m[IU]/mL

## 2019-05-03 LAB — TSH: TSH: 1.2 u[IU]/mL (ref 0.450–4.500)

## 2019-05-03 MED ORDER — METRONIDAZOLE 500 MG PO TABS: 500.0000 mg | ORAL_TABLET | Freq: Two times a day (BID) | ORAL | 0 refills | Status: AC

## 2019-05-03 NOTE — Telephone Encounter (Signed)
Bacterial vaginosis is positive. This was not treated at the urgent care visit.  Flagyl 500 mg BID x 7 days #14 no refills sent to patients pharmacy of choice.    Patient contacted and made aware of all results, all questions answered.  

## 2019-05-10 ENCOUNTER — Encounter: Payer: Self-pay | Admitting: General Practice

## 2019-05-12 ENCOUNTER — Ambulatory Visit (HOSPITAL_COMMUNITY): Payer: Self-pay

## 2019-05-12 ENCOUNTER — Ambulatory Visit (HOSPITAL_COMMUNITY)
Admission: RE | Admit: 2019-05-12 | Discharge: 2019-05-12 | Disposition: A | Discharge status: Home / Self Care | Payer: Self-pay | Source: Ambulatory Visit | Attending: Obstetrics and Gynecology | Admitting: Obstetrics and Gynecology

## 2019-05-12 ENCOUNTER — Other Ambulatory Visit: Payer: Self-pay

## 2019-05-12 DIAGNOSIS — N911 Secondary amenorrhea: Secondary | ICD-10-CM | POA: Insufficient documentation

## 2019-05-13 NOTE — Progress Notes (Signed)
I have taken care of this, thank you

## 2019-08-03 ENCOUNTER — Other Ambulatory Visit: Payer: Self-pay

## 2019-08-03 DIAGNOSIS — R51 Headache: Secondary | ICD-10-CM | POA: Insufficient documentation

## 2019-08-03 DIAGNOSIS — F17210 Nicotine dependence, cigarettes, uncomplicated: Secondary | ICD-10-CM | POA: Insufficient documentation

## 2019-08-03 DIAGNOSIS — R110 Nausea: Secondary | ICD-10-CM | POA: Insufficient documentation

## 2019-08-04 ENCOUNTER — Emergency Department (HOSPITAL_COMMUNITY)
Admission: EM | Admit: 2019-08-04 | Discharge: 2019-08-04 | Disposition: A | Discharge status: Home / Self Care | Payer: Self-pay | Attending: Emergency Medicine | Admitting: Emergency Medicine

## 2019-08-04 ENCOUNTER — Encounter: Payer: Self-pay | Admitting: Emergency Medicine

## 2019-08-04 DIAGNOSIS — R110 Nausea: Secondary | ICD-10-CM

## 2019-08-04 DIAGNOSIS — R519 Headache, unspecified: Secondary | ICD-10-CM

## 2019-08-04 MED ORDER — ONDANSETRON 4 MG PO TBDP: 4.0000 mg | ORAL_TABLET | Freq: Three times a day (TID) | ORAL | 0 refills | Status: DC | PRN

## 2019-08-04 MED ORDER — METOCLOPRAMIDE HCL 5 MG/ML IJ SOLN
10.0000 mg | Freq: Once | INTRAMUSCULAR | Status: DC
  Filled 2019-08-04: qty 2

## 2019-08-04 MED ORDER — DIPHENHYDRAMINE HCL 50 MG/ML IJ SOLN
12.5000 mg | Freq: Once | INTRAMUSCULAR | Status: DC
  Filled 2019-08-04: qty 1

## 2019-08-04 MED ORDER — ONDANSETRON 4 MG PO TBDP
4.0000 mg | ORAL_TABLET | Freq: Once | ORAL | Status: AC
  Administered 2019-08-04: 06:00:00 4 mg via ORAL
  Filled 2019-08-04: qty 1

## 2019-08-04 MED ORDER — DEXAMETHASONE SODIUM PHOSPHATE 10 MG/ML IJ SOLN
10.0000 mg | Freq: Once | INTRAMUSCULAR | Status: DC
  Filled 2019-08-04: qty 1

## 2019-08-04 NOTE — ED Triage Notes (Signed)
Patient is complaining of bilateral temple, headache with nausea and vomiting x 1. Symptoms started 4 days ago and worse in the morning. Denies blurry vision and dizziness. Patient denies taking any medications to relief her symptoms.

## 2019-08-04 NOTE — ED Notes (Signed)
Patient is refusing treatment. She states she is suppose to be at work right now.

## 2019-08-04 NOTE — ED Provider Notes (Signed)
[facility]-EMERGENCY DEPT Provider Note   CSN: [id number] Arrival date & time: 08/03/19  2320     History   Chief Complaint Chief Complaint  Patient presents with  . Headache  . Nausea    HPI Stephanie Reid is a 28 y.o. female.     Patient to ED with complaint of intermittent headache for the past 4 days. The headache comes and goes and is not always in the same location. No aggravating or alleviating factors. No fever, congestion, sinus pressure, sore throat or cough. No history of headaches. She also reports nausea for the same 4 days that is constant. No vomiting, diarrhea or constipation. She has not tried anything at home to help symptoms.   The history is provided by the patient. No language interpreter was used.  Headache Associated symptoms: nausea   Associated symptoms: no abdominal pain, no congestion, no cough, no diarrhea, no fever, no photophobia, no sore throat and no vomiting     Past Medical History:  Diagnosis Date  . Bacterial vaginosis   . Hx of chlamydia infection   . Hx of gonorrhea   . Hx of trichomoniasis   . Seasonal allergies     Patient Active Problem List   Diagnosis Date Noted  . Amenorrhea, secondary 10/06/2013    Past Surgical History:  Procedure Laterality Date  . NO PAST SURGERIES       OB History    Gravida  0   Para  0   Term  0   Preterm  0   AB  0   Living  0     SAB  0   TAB  0   Ectopic  0   Multiple  0   Live Births  0            Home Medications    Prior to Admission medications   Not on File    Family History Family History  Problem Relation Age of Onset  . Hypertension Mother   . Heart disease Mother   . Stroke Mother   . Diabetes Maternal Grandmother     Social History Social History   Tobacco Use  . Smoking status: Current Every Day Smoker    Packs/day: 0.10    Types: Cigarettes  . Smokeless tobacco: Never Used  Substance Use Topics  . Alcohol use:  Yes    Comment: on weekends  . Drug use: No     Allergies   Apple, Banana, Orange fruit [citrus], Lactase-lactobacillus, Spinach, Vicodin [hydrocodone-acetaminophen], and Penicillin g   Review of Systems Review of Systems  Constitutional: Negative for chills and fever.  HENT: Negative.  Negative for congestion and sore throat.   Eyes: Negative.  Negative for photophobia and visual disturbance.  Respiratory: Negative.  Negative for cough and shortness of breath.   Cardiovascular: Negative.  Negative for chest pain.  Gastrointestinal: Positive for nausea. Negative for abdominal pain, constipation, diarrhea and vomiting.  Genitourinary: Negative.   Musculoskeletal: Negative.   Skin: Negative.   Neurological: Positive for headaches.     Physical Exam Updated Vital Signs BP 108/73 (BP Location: Right Arm)   Pulse 68   Temp 98.1 F (36.7 C) (Oral)   Resp 18   Ht 5\' 5"  (1.651 m)   Wt 96.2 kg   SpO2 100%   BMI 35.28 kg/m   Physical Exam Vitals signs and nursing note reviewed.  Constitutional:      Appearance: She is well-developed.  HENT:     Head: Normocephalic.  Neck:     Musculoskeletal: Normal range of motion and neck supple.  Cardiovascular:     Rate and Rhythm: Normal rate and regular rhythm.     Heart sounds: No murmur.  Pulmonary:     Effort: Pulmonary effort is normal.     Breath sounds: Normal breath sounds. No wheezing, rhonchi or rales.  Abdominal:     General: Bowel sounds are normal.     Palpations: Abdomen is soft.     Tenderness: There is no abdominal tenderness. There is no guarding or rebound.  Musculoskeletal: Normal range of motion.  Skin:    General: Skin is warm and dry.     Findings: No rash.  Neurological:     Mental Status: She is alert.     Comments: CN's 3-12 grossly intact. Speech is clear and focused. No facial asymmetry. No lateralizing weakness. Reflexes are equal. No deficits of coordination. Ambulatory without imbalance.         ED Treatments / Results  Labs (all labs ordered are listed, but only abnormal results are displayed) Labs Reviewed  BASIC METABOLIC PANEL  I-STAT BETA HCG BLOOD, ED ([facility], [facility], [facility] ONLY)    EKG None  Radiology No results found.  Procedures Procedures (including critical care time)  Medications Ordered in ED Medications  metoCLOPramide (REGLAN) injection 10 mg (has no administration in time range)  diphenhydrAMINE (BENADRYL) injection 12.5 mg (has no administration in time range)  dexamethasone (DECADRON) injection 10 mg (has no administration in time range)     Initial Impression / Assessment and Plan / ED Course  I have reviewed the triage vital signs and the nursing notes.  Pertinent labs & imaging results that were available during my care of the patient were reviewed by me and considered in my medical decision making (see chart for details).        Patient to ED with constant nausea and intermittent headache x 4 days. No other ss/sxs of illness.   The patient is well appearing, normal VS, in NAD. Exam is unremarkable. No neurologic deficits. No objective findings.   Will provide headache cocktail for symptomatic relief.   The patient declines IV administered medications for headache, reporting she no longer has a headache. She does have ongoing nausea and will take Zofran to see if this helps.   On recheck, her nausea is resolved. VSS. She is felt appropriate for discharge home. Encouraged to follow up with primary care.   Final Clinical Impressions(s) / ED Diagnoses   Final diagnoses:  None   1. Nausea 2. Nonspecific headache  ED Discharge Orders    None       Charlann Lange, PA-C 08/04/19 0658    Shanon Rosser, MD 08/04/19 [phone]

## 2019-08-04 NOTE — Discharge Instructions (Signed)
Use Zofran for nausea as needed.   Return to the emergency department if symptoms worsen or for new concern.   You have been given a list of potential primary care clinics. Please call the clinic of your choice for primary care needs.

## 2019-12-11 ENCOUNTER — Encounter (HOSPITAL_COMMUNITY): Payer: Self-pay | Admitting: Emergency Medicine

## 2019-12-11 ENCOUNTER — Other Ambulatory Visit: Payer: Self-pay

## 2019-12-11 ENCOUNTER — Ambulatory Visit (HOSPITAL_COMMUNITY)
Admission: EM | Admit: 2019-12-11 | Discharge: 2019-12-11 | Disposition: A | Discharge status: Home / Self Care | Payer: Self-pay

## 2019-12-11 DIAGNOSIS — Z3202 Encounter for pregnancy test, result negative: Secondary | ICD-10-CM

## 2019-12-11 DIAGNOSIS — N898 Other specified noninflammatory disorders of vagina: Secondary | ICD-10-CM

## 2019-12-11 DIAGNOSIS — Z711 Person with feared health complaint in whom no diagnosis is made: Secondary | ICD-10-CM

## 2019-12-11 LAB — POC URINE PREG, ED
Preg Test, Ur: NEGATIVE
Preg Test, Ur: NEGATIVE

## 2019-12-11 LAB — POCT URINALYSIS DIP (DEVICE)
Bilirubin Urine: NEGATIVE
Glucose, UA: NEGATIVE mg/dL
Hgb urine dipstick: NEGATIVE
Ketones, ur: NEGATIVE mg/dL
Nitrite: NEGATIVE
Protein, ur: NEGATIVE mg/dL
Specific Gravity, Urine: >=1.03 (ref 1.005–1.030)
Urobilinogen, UA: 0.2 mg/dL (ref 0.0–1.0)
pH: 6.5 (ref 5.0–8.0)

## 2019-12-11 LAB — POCT PREGNANCY, URINE: Preg Test, Ur: NEGATIVE

## 2019-12-11 MED ORDER — FLUCONAZOLE 150 MG PO TABS: 150.0000 mg | ORAL_TABLET | Freq: Once | ORAL | 0 refills | Status: AC

## 2019-12-11 MED ORDER — DOXYCYCLINE HYCLATE 100 MG PO CAPS: 100.0000 mg | ORAL_CAPSULE | Freq: Two times a day (BID) | ORAL | 0 refills | Status: AC

## 2019-12-11 MED ORDER — CEFTRIAXONE SODIUM 500 MG IJ SOLR
500.0000 mg | Freq: Once | INTRAMUSCULAR | Status: AC
  Administered 2019-12-11: 500 mg via INTRAMUSCULAR

## 2019-12-11 MED ORDER — METRONIDAZOLE 500 MG PO TABS: 500.0000 mg | ORAL_TABLET | Freq: Two times a day (BID) | ORAL | 0 refills | Status: DC

## 2019-12-11 MED ORDER — CEFTRIAXONE SODIUM 500 MG IJ SOLR
INTRAMUSCULAR | Status: AC
  Filled 2019-12-11: qty 500

## 2019-12-11 NOTE — ED Triage Notes (Signed)
Patient has vaginal irritation and vaginal discharge for 3-4 days.  Reports foul odor to discharge. Has lower abdominal pain.  Has felt sharp pain go through her vagina.

## 2019-12-11 NOTE — ED Notes (Signed)
Sent for specimen

## 2019-12-11 NOTE — ED Provider Notes (Signed)
[facility]    CSN: [id number] Arrival date & time: 12/11/19  1014      History   Chief Complaint Chief Complaint  Patient presents with  . Vaginal Discharge    HPI Stephanie Reid is a 29 y.o. female.   Patient reports to urgent care today for vaginal discharge and irritation. Symptoms began 3-4 days ago. Discharge is noted to be large amounts of green/yellow discharge. There is a foul odor but she states it is not fishy smelling. She has had some vaginal irritation and 1 episode of vaginal pain. She has had some intermittent lower abdominal discomfort. She denies fever but believes she has been feeling colder lately.   She reports taking 2 days of metronidazole that she obtained from a friend because she believed this was a bacterial infection.  She denies painful urination or urgency. She drinks a lot of water and states she urinates a lot at baseline.  She reports a history of gonorrhea, trichomoniasis and chlamydia to a few years ago. She reports 1 female sexual partner, they do not utilize condoms. She denies this partner has any testing or known STDs.      Past Medical History:  Diagnosis Date  . Bacterial vaginosis   . Hx of chlamydia infection   . Hx of gonorrhea   . Hx of trichomoniasis   . Seasonal allergies     Patient Active Problem List   Diagnosis Date Noted  . Amenorrhea, secondary 10/06/2013    Past Surgical History:  Procedure Laterality Date  . NO PAST SURGERIES      OB History    Gravida  0   Para  0   Term  0   Preterm  0   AB  0   Living  0     SAB  0   TAB  0   Ectopic  0   Multiple  0   Live Births  0            Home Medications    Prior to Admission medications   Medication Sig Start Date End Date Taking? Authorizing Provider  doxycycline (VIBRAMYCIN) 100 MG capsule Take 1 capsule (100 mg total) by mouth 2 (two) times daily for 7 days. 12/11/19 12/18/19  Keanon Bevins, Veryl Speak, PA-C  fluconazole (DIFLUCAN)  150 MG tablet Take 1 tablet (150 mg total) by mouth once for 1 dose. 12/11/19 12/11/19  Zea Kostka, Veryl Speak, PA-C  metroNIDAZOLE (FLAGYL) 500 MG tablet Take 1 tablet (500 mg total) by mouth 2 (two) times daily. 12/11/19   Seung Nidiffer, Veryl Speak, PA-C    Family History Family History  Problem Relation Age of Onset  . Hypertension Mother   . Heart disease Mother   . Stroke Mother   . Diabetes Maternal Grandmother     Social History Social History   Tobacco Use  . Smoking status: Current Every Day Smoker    Packs/day: 0.10    Types: Cigarettes  . Smokeless tobacco: Never Used  Substance Use Topics  . Alcohol use: Yes    Comment: on weekends  . Drug use: Yes    Types: Marijuana     Allergies   Apple, Banana, Orange fruit [citrus], Lactase-lactobacillus, Spinach, Vicodin [hydrocodone-acetaminophen], and Penicillin g   Review of Systems Review of Systems  Constitutional: Negative for chills and fever.  Eyes: Negative for pain and visual disturbance.  Respiratory: Negative for cough.   Gastrointestinal: Positive for abdominal pain.  Genitourinary: Positive for  frequency, vaginal discharge and vaginal pain. Negative for dyspareunia, dysuria, flank pain, hematuria, menstrual problem, pelvic pain, urgency and vaginal bleeding.  Musculoskeletal: Negative for arthralgias, back pain and myalgias.  Skin: Negative for color change and rash.  All other systems reviewed and are negative.    Physical Exam Triage Vital Signs ED Triage Vitals  Enc Vitals Group     BP 12/11/19 1103 110/75     Pulse Rate 12/11/19 1103 73     Resp 12/11/19 1103 18     Temp 12/11/19 1103 98 F (36.7 C)     Temp Source 12/11/19 1103 Oral     SpO2 12/11/19 1103 99 %     Weight --      Height --      Head Circumference --      Peak Flow --      Pain Score 12/11/19 1058 10     Pain Loc --      Pain Edu? --      Excl. in [place]? --    No data found.  Updated Vital Signs BP 110/75 (BP Location: Left Arm)   Pulse 73    Temp 98 F (36.7 C) (Oral)   Resp 18   LMP 07/11/2019   SpO2 99%   Visual Acuity Right Eye Distance:   Left Eye Distance:   Bilateral Distance:    Right Eye Near:   Left Eye Near:    Bilateral Near:     Physical Exam Vitals and nursing note reviewed.  Constitutional:      General: She is not in acute distress.    Appearance: Normal appearance. She is well-developed. She is not ill-appearing.  HENT:     Head: Normocephalic and atraumatic.  Eyes:     General: No scleral icterus.       Right eye: No discharge.        Left eye: No discharge.     Conjunctiva/sclera: Conjunctivae normal.     Pupils: Pupils are equal, round, and reactive to light.  Cardiovascular:     Rate and Rhythm: Normal rate.  Pulmonary:     Effort: Pulmonary effort is normal. No respiratory distress.  Abdominal:     Palpations: Abdomen is soft.     Tenderness: There is no abdominal tenderness (to include pelvic region). There is no right CVA tenderness or left CVA tenderness.  Genitourinary:    Comments: Deferred for Self Swab Musculoskeletal:     Cervical back: Neck supple.     Right lower leg: No edema.     Left lower leg: No edema.  Skin:    General: Skin is warm and dry.  Neurological:     General: No focal deficit present.     Mental Status: She is alert and oriented to person, place, and time.  Psychiatric:        Mood and Affect: Mood normal.        Behavior: Behavior normal.        Thought Content: Thought content normal.        Judgment: Judgment normal.      UC Treatments / Results  Labs (all labs ordered are listed, but only abnormal results are displayed) Labs Reviewed  POCT URINALYSIS DIP (DEVICE) - Abnormal; Notable for the following components:      Result Value   Leukocytes,Ua TRACE (*)    All other components within normal limits  POCT PREGNANCY, URINE  POC URINE PREG, ED  POC URINE PREG,  ED  CERVICOVAGINAL ANCILLARY ONLY    EKG   Radiology No results found.   Procedures Procedures (including critical care time)  Medications Ordered in UC Medications  cefTRIAXone (ROCEPHIN) injection 500 mg (has no administration in time range)    Initial Impression / Assessment and Plan / UC Course  I have reviewed the triage vital signs and the nursing notes.  Pertinent labs & imaging results that were available during my care of the patient were reviewed by me and considered in my medical decision making (see chart for details).     #Vaginal Discharge #Concern for STI - based on reported history and patients history of STDs, concern this is STI related. Self Swab done. Abdominal exam benign with no TTP to include pelvic region.  - Elected to treat now as opposed to wait for results based on copious discharge - 500mg  rocephin given, doxy and metronidazole sent. Diflucan sent but instructed to hold for results - Discussed my recommendation for HIV and Syphillis testing however patient declines today. Discussed that this is the recommendation by the CDC and is good practice based on continued unprotected sexual activity. Patient still declines. - Trace leuks on UA, elected not send culture due to coverage with rocephin and doxy and patient is self pay.   Final Clinical Impressions(s) / UC Diagnoses   Final diagnoses:  Vaginal discharge  Concern about STD in female without diagnosis     Discharge Instructions     We have sent your lab work out, we will notify you with results. If you have positive results for Gonorrhea, Chlamydia or trichomonas, you need to inform your partner and they need to be tested and treated.  We have treated you in office for gonorrhea based on your symptoms. I have sent a prescription for doxycyline and metronidazole as well. Please begin taking these medications and based on your results we may alter these.  Do NOT have sexual contact until treatment is complete and all test results have returned. Always practice safe sex  with condoms to help prevent STD's.   I have sent a prescription for diflucan, you can wait until results to take this medication.  If you develop fever, severe abdominal or pelvic pain, please be re-evaluated.   Please establish with a primary care provider     ED Prescriptions    Medication Sig Dispense Auth. Provider   doxycycline (VIBRAMYCIN) 100 MG capsule Take 1 capsule (100 mg total) by mouth 2 (two) times daily for 7 days. 14 capsule Netta Fodge, , PA-C   metroNIDAZOLE (FLAGYL) 500 MG tablet Take 1 tablet (500 mg total) by mouth 2 (two) times daily. 14 tablet Adilee Lemme, Veryl Speak, PA-C   fluconazole (DIFLUCAN) 150 MG tablet Take 1 tablet (150 mg total) by mouth once for 1 dose. 1 tablet Arrington Bencomo, Veryl Speak, PA-C     PDMP not reviewed this encounter.   Veryl Speak, PA-C 12/11/19 1217

## 2019-12-11 NOTE — Discharge Instructions (Addendum)
We have sent your lab work out, we will notify you with results. If you have positive results for Gonorrhea, Chlamydia or trichomonas, you need to inform your partner and they need to be tested and treated.  We have treated you in office for gonorrhea based on your symptoms. I have sent a prescription for doxycyline and metronidazole as well. Please begin taking these medications and based on your results we may alter these.  Do NOT have sexual contact until treatment is complete and all test results have returned. Always practice safe sex with condoms to help prevent STD's.   I have sent a prescription for diflucan, you can wait until results to take this medication.  If you develop fever, severe abdominal or pelvic pain, please be re-evaluated.   Please establish with a primary care provider

## 2019-12-15 LAB — CERVICOVAGINAL ANCILLARY ONLY
Bacterial vaginitis: POSITIVE — AB
Candida vaginitis: NEGATIVE
Chlamydia: NEGATIVE
Neisseria Gonorrhea: NEGATIVE
Trichomonas: POSITIVE — AB

## 2019-12-16 ENCOUNTER — Encounter (HOSPITAL_COMMUNITY): Payer: Self-pay

## 2019-12-16 ENCOUNTER — Telehealth (HOSPITAL_COMMUNITY): Payer: Self-pay | Admitting: Emergency Medicine

## 2019-12-16 NOTE — Telephone Encounter (Signed)
Bacterial Vaginosis test is positive.  Prescription for metronidazole was given at the urgent care visit.  Trichomonas is positive. Rx metronidazole was given at the urgent care visit. Pt needs education to please refrain from sexual intercourse for 7 days to give the medicine time to work. Sexual partners need to be notified and tested/treated. Condoms may reduce risk of reinfection. Recheck for further evaluation if symptoms are not improving.   Attempted to reach patient. No answer at this time. Voicemail left.    

## 2020-02-08 ENCOUNTER — Encounter (HOSPITAL_COMMUNITY): Payer: Self-pay

## 2020-02-08 ENCOUNTER — Ambulatory Visit (HOSPITAL_COMMUNITY)
Admission: EM | Admit: 2020-02-08 | Discharge: 2020-02-08 | Disposition: A | Discharge status: Home / Self Care | Payer: Self-pay | Attending: Family Medicine | Admitting: Family Medicine

## 2020-02-08 ENCOUNTER — Other Ambulatory Visit: Payer: Self-pay

## 2020-02-08 DIAGNOSIS — N949 Unspecified condition associated with female genital organs and menstrual cycle: Secondary | ICD-10-CM | POA: Insufficient documentation

## 2020-02-08 DIAGNOSIS — N898 Other specified noninflammatory disorders of vagina: Secondary | ICD-10-CM | POA: Insufficient documentation

## 2020-02-08 DIAGNOSIS — R300 Dysuria: Secondary | ICD-10-CM | POA: Insufficient documentation

## 2020-02-08 DIAGNOSIS — Z3202 Encounter for pregnancy test, result negative: Secondary | ICD-10-CM

## 2020-02-08 DIAGNOSIS — R3915 Urgency of urination: Secondary | ICD-10-CM | POA: Insufficient documentation

## 2020-02-08 DIAGNOSIS — R350 Frequency of micturition: Secondary | ICD-10-CM | POA: Insufficient documentation

## 2020-02-08 LAB — POCT URINALYSIS DIP (DEVICE)
Bilirubin Urine: NEGATIVE
Glucose, UA: NEGATIVE mg/dL
Hgb urine dipstick: NEGATIVE
Ketones, ur: NEGATIVE mg/dL
Nitrite: NEGATIVE
Protein, ur: NEGATIVE mg/dL
Specific Gravity, Urine: 1.015 (ref 1.005–1.030)
Urobilinogen, UA: 0.2 mg/dL (ref 0.0–1.0)
pH: 6.5 (ref 5.0–8.0)

## 2020-02-08 LAB — POC URINE PREG, ED: Preg Test, Ur: NEGATIVE

## 2020-02-08 LAB — POCT PREGNANCY, URINE: Preg Test, Ur: NEGATIVE

## 2020-02-08 NOTE — ED Provider Notes (Signed)
[place]    CSN: [id number] Arrival date & time: 02/08/20  1200      History   Chief Complaint Chief Complaint  Patient presents with  . Dysuria    HPI Stephanie Reid is a 29 y.o. female.   Patient reports vaginal itching and burning.  She is very tender when she wipes when she has to void.  Reports that her partner put some scented lotion on his penis before they had vaginal intercourse, and she has been having irritation since then.  Reports white discharge, redness, a little bit of swelling.  Has not made an attempt to treat this at home.  ROS per HPI  The history is provided by the patient.    Past Medical History:  Diagnosis Date  . Bacterial vaginosis   . Hx of chlamydia infection   . Hx of gonorrhea   . Hx of trichomoniasis   . Seasonal allergies     Patient Active Problem List   Diagnosis Date Noted  . Amenorrhea, secondary 10/06/2013    Past Surgical History:  Procedure Laterality Date  . NO PAST SURGERIES      OB History    Gravida  0   Para  0   Term  0   Preterm  0   AB  0   Living  0     SAB  0   TAB  0   Ectopic  0   Multiple  0   Live Births  0            Home Medications    Prior to Admission medications   Medication Sig Start Date End Date Taking? Authorizing Provider  metroNIDAZOLE (FLAGYL) 500 MG tablet Take 1 tablet (500 mg total) by mouth 2 (two) times daily. 12/11/19   Darr, Marguerita Beards, PA-C    Family History Family History  Problem Relation Age of Onset  . Hypertension Mother   . Heart disease Mother   . Stroke Mother   . Diabetes Maternal Grandmother     Social History Social History   Tobacco Use  . Smoking status: Current Every Day Smoker    Packs/day: 0.10    Types: Cigarettes  . Smokeless tobacco: Never Used  Substance Use Topics  . Alcohol use: Yes    Comment: on weekends  . Drug use: Yes    Types: Marijuana     Allergies   Apple, Banana, Orange fruit [citrus],  Lactase-lactobacillus, Spinach, Vicodin [hydrocodone-acetaminophen], and Penicillin g   Review of Systems Review of Systems   Physical Exam Triage Vital Signs ED Triage Vitals  Enc Vitals Group     BP 02/08/20 1313 104/84     Pulse Rate 02/08/20 1313 74     Resp 02/08/20 1313 18     Temp 02/08/20 1313 98.4 F (36.9 C)     Temp Source 02/08/20 1313 Oral     SpO2 02/08/20 1313 97 %     Weight --      Height --      Head Circumference --      Peak Flow --      Pain Score 02/08/20 1311 0     Pain Loc --      Pain Edu? --      Excl. in [place]? --    No data found.  Updated Vital Signs BP 104/84 (BP Location: Left Arm)   Pulse 74   Temp 98.4 F (36.9 C) (Oral)  Resp 18   SpO2 97%   Visual Acuity Right Eye Distance:   Left Eye Distance:   Bilateral Distance:    Right Eye Near:   Left Eye Near:    Bilateral Near:     Physical Exam Vitals and nursing note reviewed.  Constitutional:      General: She is not in acute distress.    Appearance: Normal appearance. She is well-developed. She is obese.  HENT:     Head: Normocephalic and atraumatic.     Nose: Nose normal.  Eyes:     Conjunctiva/sclera: Conjunctivae normal.  Cardiovascular:     Rate and Rhythm: Normal rate and regular rhythm.     Heart sounds: Normal heart sounds. No murmur.  Pulmonary:     Effort: Pulmonary effort is normal. No respiratory distress.     Breath sounds: Normal breath sounds. No stridor. No wheezing, rhonchi or rales.  Abdominal:     General: Bowel sounds are normal. There is no distension.     Palpations: Abdomen is soft. There is no mass.     Tenderness: There is no abdominal tenderness. There is no guarding or rebound.     Hernia: No hernia is present.  Musculoskeletal:        General: Normal range of motion.     Cervical back: Normal range of motion and neck supple.  Skin:    General: Skin is warm and dry.     Capillary Refill: Capillary refill takes less than 2 seconds.    Neurological:     General: No focal deficit present.     Mental Status: She is alert and oriented to person, place, and time.  Psychiatric:        Mood and Affect: Mood normal.        Behavior: Behavior normal.      UC Treatments / Results  Labs (all labs ordered are listed, but only abnormal results are displayed) Labs Reviewed  POCT URINALYSIS DIP (DEVICE) - Abnormal; Notable for the following components:      Result Value   Leukocytes,Ua TRACE (*)    All other components within normal limits  URINE CULTURE  POCT PREGNANCY, URINE  POC URINE PREG, ED    EKG   Radiology No results found.  Procedures Procedures (including critical care time)  Medications Ordered in UC Medications - No data to display  Initial Impression / Assessment and Plan / UC Course  I have reviewed the triage vital signs and the nursing notes.  Pertinent labs & imaging results that were available during my care of the patient were reviewed by me and considered in my medical decision making (see chart for details).     Vaginal itching and burning for the last 3 days.  Likely yeast, she has just been treated with metronidazole for trichomoniasis and BV.  Denies fluconazole prescription, reports that she is getting use over-the-counter Monistat.  Instructed to follow-up if that is not resolve her symptoms.  Declines STD testing today. Final Clinical Impressions(s) / UC Diagnoses   Final diagnoses:  Dysuria  Frequency of micturition  Urgency of urination  Vaginal itching  Vaginal burning     Discharge Instructions     You may have a urinary tract infection. We are going to culture your urine and will call you as soon as we have the results.   Drink plenty of water, 8-10 glasses per day.   You may take AZO over the counter for painful urination.  Follow up with your primary care provider as needed.   Go to the Emergency Department if you experience severe pain, shortness of breath,  high fever, or other concerns.      ED Prescriptions    None     PDMP not reviewed this encounter.   Moshe Cipro, NP 02/08/20 1349

## 2020-02-08 NOTE — Discharge Instructions (Addendum)
You may have a urinary tract infection. We are going to culture your urine and will call you as soon as we have the results.   Drink plenty of water, 8-10 glasses per day.   You may take AZO over the counter for painful urination.  Follow up with your primary care provider as needed.   Go to the Emergency Department if you experience severe pain, shortness of breath, high fever, or other concerns.   

## 2020-02-08 NOTE — ED Triage Notes (Signed)
Pt c/o urinary frequency, urgency and burning on urination for approx 3 days. Denies abdom pain, fever, chills, n/v.  Began taking cranberry OTC pills last night.

## 2020-02-09 ENCOUNTER — Ambulatory Visit (HOSPITAL_COMMUNITY)
Admission: EM | Admit: 2020-02-09 | Discharge: 2020-02-09 | Disposition: A | Discharge status: Home / Self Care | Payer: Self-pay | Attending: Urgent Care | Admitting: Urgent Care

## 2020-02-09 ENCOUNTER — Encounter (HOSPITAL_COMMUNITY): Payer: Self-pay

## 2020-02-09 ENCOUNTER — Other Ambulatory Visit: Payer: Self-pay

## 2020-02-09 DIAGNOSIS — R300 Dysuria: Secondary | ICD-10-CM | POA: Insufficient documentation

## 2020-02-09 DIAGNOSIS — R350 Frequency of micturition: Secondary | ICD-10-CM | POA: Insufficient documentation

## 2020-02-09 DIAGNOSIS — M545 Low back pain, unspecified: Secondary | ICD-10-CM

## 2020-02-09 DIAGNOSIS — R102 Pelvic and perineal pain: Secondary | ICD-10-CM | POA: Insufficient documentation

## 2020-02-09 DIAGNOSIS — N3090 Cystitis, unspecified without hematuria: Secondary | ICD-10-CM | POA: Insufficient documentation

## 2020-02-09 LAB — POCT URINALYSIS DIP (DEVICE)
Bilirubin Urine: NEGATIVE
Glucose, UA: NEGATIVE mg/dL
Hgb urine dipstick: NEGATIVE
Ketones, ur: NEGATIVE mg/dL
Nitrite: NEGATIVE
Protein, ur: NEGATIVE mg/dL
Specific Gravity, Urine: 1.015 (ref 1.005–1.030)
Urobilinogen, UA: 0.2 mg/dL (ref 0.0–1.0)
pH: 6 (ref 5.0–8.0)

## 2020-02-09 LAB — URINE CULTURE: Culture: NO GROWTH

## 2020-02-09 MED ORDER — NITROFURANTOIN MONOHYD MACRO 100 MG PO CAPS: 100.0000 mg | ORAL_CAPSULE | Freq: Two times a day (BID) | ORAL | 0 refills | Status: DC

## 2020-02-09 NOTE — ED Provider Notes (Signed)
[place]   MRN: [id number] DOB: 05/09/1991  Subjective:   Stephanie Reid is a 29 y.o. female presenting for 4-day history of persistent genital discomfort.  Patient was seen yesterday at our clinic, had genital irritation, vaginal burning and itching.  This is still happening today, also reports that she has dysuria, urinary frequency and developed pelvic cramping and low back pain this morning.  Patient is sexually active, has one female partner.  Does not want a pregnancy test again, "knows that she is not pregnant".  States that she has had all of the STIs before and her current symptoms are very different.  She previously had tested positive for trichomonas in January but is no longer having sex with this person.  She also states that she took fluconazole which she had from a previous infection and states that this did not help.  Patient hydrates very well with water.  No current facility-administered medications for this encounter.  Current Outpatient Medications:  .  metroNIDAZOLE (FLAGYL) 500 MG tablet, Take 1 tablet (500 mg total) by mouth 2 (two) times daily., Disp: 14 tablet, Rfl: 0   Allergies  Allergen Reactions  . Apple Anaphylaxis  . Banana Anaphylaxis  . Orange Fruit [Citrus] Anaphylaxis  . Lactase-Lactobacillus Other (See Comments)    hemmorrhoids  . Spinach Diarrhea  . Vicodin [Hydrocodone-Acetaminophen] Nausea And Vomiting  . Penicillin G      GI Upset (intolerance) Did it involve swelling of the face/tongue/throat, SOB, or low BP? No Did it involve sudden or severe rash/hives, skin peeling, or any reaction on the inside of your mouth or nose? No Did you need to seek medical attention at a hospital or doctor's office? No When did it last happen?not recent If all above answers are "NO", may proceed with cephalosporin use.     Past Medical History:  Diagnosis Date  . Bacterial vaginosis   . Hx of chlamydia infection   . Hx of gonorrhea   . Hx of  trichomoniasis   . Seasonal allergies      Past Surgical History:  Procedure Laterality Date  . NO PAST SURGERIES      Family History  Problem Relation Age of Onset  . Hypertension Mother   . Heart disease Mother   . Stroke Mother   . Diabetes Maternal Grandmother     Social History   Tobacco Use  . Smoking status: Current Every Day Smoker    Packs/day: 0.10    Types: Cigarettes  . Smokeless tobacco: Never Used  Substance Use Topics  . Alcohol use: Yes    Comment: on weekends  . Drug use: Yes    Types: Marijuana    ROS   Objective:   Vitals: BP 124/77   Pulse 84   Temp 99.2 F (37.3 C) (Oral)   Resp 18   Ht 5\' 5"  (1.651 m)   Wt 213 lb (96.6 kg)   SpO2 98%   BMI 35.45 kg/m   Physical Exam Constitutional:      General: She is not in acute distress.    Appearance: Normal appearance. She is well-developed. She is obese. She is not ill-appearing, toxic-appearing or diaphoretic.  HENT:     Head: Normocephalic and atraumatic.     Right Ear: External ear normal.     Left Ear: External ear normal.     Nose: Nose normal.     Mouth/Throat:     Mouth: Mucous membranes are moist.  Pharynx: Oropharynx is clear.  Eyes:     General: No scleral icterus.    Extraocular Movements: Extraocular movements intact.     Pupils: Pupils are equal, round, and reactive to light.  Cardiovascular:     Rate and Rhythm: Normal rate and regular rhythm.     Heart sounds: Normal heart sounds. No murmur. No friction rub. No gallop.   Pulmonary:     Effort: Pulmonary effort is normal. No respiratory distress.     Breath sounds: Normal breath sounds. No stridor. No wheezing, rhonchi or rales.  Abdominal:     General: Bowel sounds are normal. There is no distension.     Palpations: Abdomen is soft. There is no mass.     Tenderness: There is no abdominal tenderness. There is no right CVA tenderness, left CVA tenderness, guarding or rebound.  Skin:    General: Skin is warm and  dry.     Coloration: Skin is not pale.     Findings: No rash.  Neurological:     General: No focal deficit present.     Mental Status: She is alert and oriented to person, place, and time.  Psychiatric:        Mood and Affect: Mood normal.        Behavior: Behavior normal.        Thought Content: Thought content normal.        Judgment: Judgment normal.     Results for orders placed or performed during the hospital encounter of 02/09/20 (from the past 24 hour(s))  POCT urinalysis dip (device)     Status: Abnormal   Collection Time: 02/09/20  9:20 AM  Result Value Ref Range   Glucose, UA NEGATIVE NEGATIVE mg/dL   Bilirubin Urine NEGATIVE NEGATIVE   Ketones, ur NEGATIVE NEGATIVE mg/dL   Specific Gravity, Urine 1.015 1.005 - 1.030   Hgb urine dipstick NEGATIVE NEGATIVE   pH 6.0 5.0 - 8.0   Protein, ur NEGATIVE NEGATIVE mg/dL   Urobilinogen, UA 0.2 0.0 - 1.0 mg/dL   Nitrite NEGATIVE NEGATIVE   Leukocytes,Ua TRACE (A) NEGATIVE    Assessment and Plan :   1. Cystitis   2. Dysuria   3. Urinary frequency   4. Pelvic cramping   5. Acute bilateral low back pain without sciatica     Discussed differential with patient but she declined empiric STI treatment.  Declined repeat pregnancy test.  Counseled that her urine culture still pending.  Patient would like to get an antibiotic to make sure we cover for an UTI.  Urine cultures pending.  Start Macrobid, maintain aggressive hydration.  Labs pending. Counseled patient on potential for adverse effects with medications prescribed/recommended today, ER and return-to-clinic precautions discussed, patient verbalized understanding.    Wallis Bamberg, PA-C 02/09/20 1049

## 2020-02-09 NOTE — Discharge Instructions (Signed)
You may take 500mg-650mg Tylenol every 6 hours for pain and inflammation.  ° ° °

## 2020-02-09 NOTE — ED Triage Notes (Signed)
Pt states she has dysuria and frequent urination that started 4 days ago. Today she started having lower back pain and pelvic cramping.

## 2020-02-11 ENCOUNTER — Telehealth (HOSPITAL_COMMUNITY): Payer: Self-pay | Admitting: *Deleted

## 2020-02-11 LAB — CERVICOVAGINAL ANCILLARY ONLY
Bacterial vaginitis: POSITIVE — AB
Candida vaginitis: NEGATIVE
Chlamydia: NEGATIVE
Neisseria Gonorrhea: NEGATIVE
Trichomonas: NEGATIVE

## 2020-02-11 MED ORDER — METRONIDAZOLE 500 MG PO TABS: 500.0000 mg | ORAL_TABLET | Freq: Two times a day (BID) | ORAL | 0 refills | Status: DC

## 2020-02-11 NOTE — Telephone Encounter (Signed)
Patient reports she is still having burning sensation with urination. Patient has been taking macrobid but culture does not show UTI. Patient and I discussed that the bacterial vaginosis may be causing this, prescription sent for flagyl to pharmacy of record. Questions answered.

## 2020-02-15 ENCOUNTER — Telehealth (HOSPITAL_COMMUNITY): Payer: Self-pay | Admitting: Internal Medicine

## 2020-02-15 MED ORDER — HYDROCORTISONE BUTYRATE 0.1 % EX SOLN: CUTANEOUS | 0 refills | Status: DC

## 2020-02-15 MED ORDER — PHENAZOPYRIDINE HCL 200 MG PO TABS: 200.0000 mg | ORAL_TABLET | Freq: Three times a day (TID) | ORAL | 0 refills | Status: DC | PRN

## 2020-02-15 NOTE — Telephone Encounter (Signed)
Patient continues to have dysuria after having completed a course of metronidazole for bacterial vaginosis.  Patient's sexual partner used scented Vaseline prior to sexual intercourse a week and a half ago.  Symptoms started after that episode.  She denies any vaginal irritation.  She says that the urethral orifice appears swollen.  No vaginal discharge at this time.  Patient denies presence of rash.  I will send patient a short course of Pyridium and steroid vaginal wash.  If no improvement she is welcome to return to urgent care for physical exam.

## 2020-02-16 ENCOUNTER — Emergency Department (HOSPITAL_COMMUNITY)
Admission: EM | Admit: 2020-02-16 | Discharge: 2020-02-16 | Disposition: A | Discharge status: Home / Self Care | Payer: Self-pay | Attending: Emergency Medicine | Admitting: Emergency Medicine

## 2020-02-16 ENCOUNTER — Other Ambulatory Visit: Payer: Self-pay

## 2020-02-16 DIAGNOSIS — Z6835 Body mass index (BMI) 35.0-35.9, adult: Secondary | ICD-10-CM | POA: Insufficient documentation

## 2020-02-16 DIAGNOSIS — N3091 Cystitis, unspecified with hematuria: Secondary | ICD-10-CM | POA: Insufficient documentation

## 2020-02-16 DIAGNOSIS — F17210 Nicotine dependence, cigarettes, uncomplicated: Secondary | ICD-10-CM | POA: Insufficient documentation

## 2020-02-16 DIAGNOSIS — E669 Obesity, unspecified: Secondary | ICD-10-CM | POA: Insufficient documentation

## 2020-02-16 DIAGNOSIS — N3090 Cystitis, unspecified without hematuria: Secondary | ICD-10-CM

## 2020-02-16 LAB — WET PREP, GENITAL
Clue Cells Wet Prep HPF POC: NONE SEEN
Sperm: NONE SEEN
Trich, Wet Prep: NONE SEEN
Yeast Wet Prep HPF POC: NONE SEEN

## 2020-02-16 LAB — URINALYSIS, ROUTINE W REFLEX MICROSCOPIC
Bilirubin Urine: NEGATIVE
Glucose, UA: NEGATIVE mg/dL
Hgb urine dipstick: NEGATIVE
Ketones, ur: NEGATIVE mg/dL
Nitrite: POSITIVE — AB
Protein, ur: NEGATIVE mg/dL
Specific Gravity, Urine: 1.021 (ref 1.005–1.030)
pH: 7 (ref 5.0–8.0)

## 2020-02-16 LAB — PREGNANCY, URINE: Preg Test, Ur: NEGATIVE

## 2020-02-16 MED ORDER — CEPHALEXIN 250 MG PO CAPS
500.0000 mg | ORAL_CAPSULE | Freq: Once | ORAL | Status: AC
  Administered 2020-02-16: 500 mg via ORAL
  Filled 2020-02-16: qty 2

## 2020-02-16 MED ORDER — CEPHALEXIN 500 MG PO CAPS: 500.0000 mg | ORAL_CAPSULE | Freq: Two times a day (BID) | ORAL | 0 refills | Status: DC

## 2020-02-16 NOTE — ED Provider Notes (Signed)
[facility] EMERGENCY DEPARTMENT Provider Note   CSN: [id number] Arrival date & time: 02/16/20  1742     History Chief Complaint  Patient presents with  . Dysuria    Stephanie Reid is a 29 y.o. female with history of STD who presents with dysuria. She states symptoms have been ongoing for one week. It burns when she pees and hurts when she wipes. Symptoms started after she had sex with her partner and he used a lotion on his penis. She denies fever, vomiting, flank pain, pelvic pain. She went to UC and a UA was done which was negative. She went back to UC the next day and was prescribed Macrobid. She also did a self-swab which was positive for BV and she was given Flagyl. She has been taking all these meds along with Pyridium without relief. Urine culture was obtained which was negative. She saw blood in her urine today so decided to come to the ED.  HPI     Past Medical History:  Diagnosis Date  . Bacterial vaginosis   . Hx of chlamydia infection   . Hx of gonorrhea   . Hx of trichomoniasis   . Seasonal allergies     Patient Active Problem List   Diagnosis Date Noted  . Amenorrhea, secondary 10/06/2013    Past Surgical History:  Procedure Laterality Date  . NO PAST SURGERIES       OB History    Gravida  0   Para  0   Term  0   Preterm  0   AB  0   Living  0     SAB  0   TAB  0   Ectopic  0   Multiple  0   Live Births  0           Family History  Problem Relation Age of Onset  . Hypertension Mother   . Heart disease Mother   . Stroke Mother   . Diabetes Maternal Grandmother     Social History   Tobacco Use  . Smoking status: Current Every Day Smoker    Packs/day: 0.10    Types: Cigarettes  . Smokeless tobacco: Never Used  Substance Use Topics  . Alcohol use: Yes    Comment: on weekends  . Drug use: Yes    Types: Marijuana    Home Medications Prior to Admission medications   Medication Sig Start Date End  Date Taking? Authorizing Provider  Hydrocortisone Butyrate 0.1 % SOLN Wash vagina at bedtime for 3 days. Do not douche. 02/15/20   Lamptey, Myrene Galas, MD  metroNIDAZOLE (FLAGYL) 500 MG tablet Take 1 tablet (500 mg total) by mouth 2 (two) times daily. 02/11/20   Lamptey, Myrene Galas, MD  nitrofurantoin, macrocrystal-monohydrate, (MACROBID) 100 MG capsule Take 1 capsule (100 mg total) by mouth 2 (two) times daily. 02/09/20   Jaynee Eagles, PA-C  phenazopyridine (PYRIDIUM) 200 MG tablet Take 1 tablet (200 mg total) by mouth 3 (three) times daily as needed for pain. 02/15/20   LampteyMyrene Galas, MD    Allergies    Apple, Banana, Orange fruit [citrus], Lactase-lactobacillus, Spinach, Vicodin [hydrocodone-acetaminophen], and Penicillin g  Review of Systems   Review of Systems  Constitutional: Negative for fever.  Genitourinary: Positive for dyspareunia, dysuria and hematuria. Negative for flank pain, pelvic pain, vaginal bleeding and vaginal discharge.    Physical Exam Updated Vital Signs BP (!) 141/97 (BP Location: Right Arm)   Pulse Marland Kitchen)  105   Temp 98.3 F (36.8 C) (Oral)   Resp 20   Ht 5\' 5"  (1.651 m)   Wt 97.5 kg   SpO2 94%   BMI 35.78 kg/m   Physical Exam Vitals and nursing note reviewed.  Constitutional:      General: She is not in acute distress.    Appearance: She is well-developed. She is obese. She is not ill-appearing.     Comments: Calm, cooperative. Well appearing  HENT:     Head: Normocephalic and atraumatic.  Eyes:     General: No scleral icterus.       Right eye: No discharge.        Left eye: No discharge.     Conjunctiva/sclera: Conjunctivae normal.     Pupils: Pupils are equal, round, and reactive to light.  Cardiovascular:     Rate and Rhythm: Normal rate.  Pulmonary:     Effort: Pulmonary effort is normal. No respiratory distress.  Abdominal:     General: There is no distension.  Genitourinary:    Comments: Pelvic: No inguinal lymphadenopathy or inguinal hernia  noted. Normal external genitalia. There is swelling and what appears to be ulcers on the urethra. It is tender to palpation. No pain with speculum insertion. Closed cervical os with normal appearance - no rash or lesions. No significant discharge or bleeding noted from cervix or in vaginal vault. Chaperone present during exam. Musculoskeletal:     Cervical back: Normal range of motion.  Skin:    General: Skin is warm and dry.  Neurological:     Mental Status: She is alert and oriented to person, place, and time.  Psychiatric:        Behavior: Behavior normal.     ED Results / Procedures / Treatments   Labs (all labs ordered are listed, but only abnormal results are displayed) Labs Reviewed  WET PREP, GENITAL - Abnormal; Notable for the following components:      Result Value   WBC, Wet Prep HPF POC FEW (*)    All other components within normal limits  URINALYSIS, ROUTINE W REFLEX MICROSCOPIC - Abnormal; Notable for the following components:   Color, Urine AMBER (*)    Nitrite POSITIVE (*)    Leukocytes,Ua SMALL (*)    Bacteria, UA RARE (*)    All other components within normal limits  URINE CULTURE  PREGNANCY, URINE  HSV 2 ANTIBODY, IGG  GC/CHLAMYDIA PROBE AMP () NOT AT [facility]    EKG None  Radiology No results found.  Procedures Procedures (including critical care time)  Medications Ordered in ED Medications - No data to display  ED Course  I have reviewed the triage vital signs and the nursing notes.  Pertinent labs & imaging results that were available during my care of the patient were reviewed by me and considered in my medical decision making (see chart for details).  29 year old female presents with dysuria for 1 week.  She has been treated for BV, yeast infection, and a UTI prior to my evaluation with no relief.  Pelvic exam was performed and shows swelling and ulceration of the urethra.  The rest of the pelvic was unremarkable.  UA was remarkable for  small leukocytes, rare bacteria, positive nitrites.  We will send off repeat urine culture.  Due to patient's history there is concern for possible herpes infection.  We will send off HSV test as well.  She will be treated with Keflex.  She was offered empiric  treatment with Valtrex however declined this stating she only wants to be treated for what her tests show.  MDM Rules/Calculators/A&P                       Final Clinical Impression(s) / ED Diagnoses Final diagnoses:  Cystitis    Rx / DC Orders ED Discharge Orders    None       Bethel Born, PA-C 02/16/20 2200    Rolan Bucco, MD 02/16/20 2242

## 2020-02-16 NOTE — Discharge Instructions (Addendum)
Take Keflex twice daily for 5 days for treatment of UTI Please follow your results on MyChart

## 2020-02-16 NOTE — ED Notes (Signed)
Pt encouraged to provide urine per MD order. Specimen cup at bedside

## 2020-02-16 NOTE — ED Triage Notes (Signed)
Pt in POV. Reports dysuria X 1 week. States she was seen at [facility] and dx with BV. Has completed her treatment but has seen no improvement in her S/S.

## 2020-02-17 LAB — GC/CHLAMYDIA PROBE AMP (~~LOC~~) NOT AT ARMC
Chlamydia: NEGATIVE
Neisseria Gonorrhea: NEGATIVE

## 2020-02-18 LAB — URINE CULTURE: Culture: NO GROWTH

## 2020-02-18 LAB — HSV 2 ANTIBODY, IGG: HSV 2 Glycoprotein G Ab, IgG: 14.9 index — ABNORMAL HIGH (ref 0.00–0.90)

## 2020-04-24 ENCOUNTER — Other Ambulatory Visit: Payer: Self-pay

## 2020-04-24 ENCOUNTER — Encounter: Payer: Self-pay | Admitting: *Deleted

## 2020-04-24 ENCOUNTER — Ambulatory Visit
Admission: EM | Admit: 2020-04-24 | Discharge: 2020-04-24 | Disposition: A | Discharge status: Home / Self Care | Payer: Self-pay | Attending: Family Medicine | Admitting: Family Medicine

## 2020-04-24 DIAGNOSIS — Z760 Encounter for issue of repeat prescription: Secondary | ICD-10-CM

## 2020-04-24 HISTORY — DX: Herpesviral infection, unspecified: B00.9

## 2020-04-24 MED ORDER — ACYCLOVIR 400 MG PO TABS: 400.0000 mg | ORAL_TABLET | Freq: Three times a day (TID) | ORAL | 3 refills | Status: DC

## 2020-04-24 NOTE — ED Triage Notes (Signed)
Pt refusing pregnancy test at this time

## 2020-04-24 NOTE — ED Triage Notes (Signed)
Reports new dx of genital herpes approx 3 wks ago.  States started with new outbreak 3 days ago & was told to be seen for refills of antivirals.

## 2020-04-24 NOTE — ED Provider Notes (Signed)
[facility]    CSN: [id number] Arrival date & time: 04/24/20  1451      History   Chief Complaint Chief Complaint  Patient presents with  . Herpes Outbreak    HPI Stephanie Reid is a 29 y.o. female.   HPI Medication refill for current herpes outbreak, diagnosed in March with genital herpes. Current outbreak started today. She is without a primary care and unable to have anti-viral medication refilled. Declines STD testing. Past Medical History:  Diagnosis Date  . Bacterial vaginosis   . Herpes simplex   . Hx of chlamydia infection   . Hx of gonorrhea   . Hx of trichomoniasis   . Seasonal allergies     Patient Active Problem List   Diagnosis Date Noted  . Amenorrhea, secondary 10/06/2013    Past Surgical History:  Procedure Laterality Date  . NO PAST SURGERIES      OB History    Gravida  0   Para  0   Term  0   Preterm  0   AB  0   Living  0     SAB  0   TAB  0   Ectopic  0   Multiple  0   Live Births  0            Home Medications    Prior to Admission medications   Medication Sig Start Date End Date Taking? Authorizing Provider  acyclovir (ZOVIRAX) 400 MG tablet Take 1 tablet (400 mg total) by mouth 3 (three) times daily. 04/24/20 06/30/20  Scot Jun, FNP  cephALEXin (KEFLEX) 500 MG capsule Take 1 capsule (500 mg total) by mouth 2 (two) times daily. 02/16/20   Recardo Evangelist, PA-C  Hydrocortisone Butyrate 0.1 % SOLN Wash vagina at bedtime for 3 days. Do not douche. 02/15/20   Lamptey, Myrene Galas, MD  metroNIDAZOLE (FLAGYL) 500 MG tablet Take 1 tablet (500 mg total) by mouth 2 (two) times daily. 02/11/20   Lamptey, Myrene Galas, MD  nitrofurantoin, macrocrystal-monohydrate, (MACROBID) 100 MG capsule Take 1 capsule (100 mg total) by mouth 2 (two) times daily. 02/09/20   Jaynee Eagles, PA-C  phenazopyridine (PYRIDIUM) 200 MG tablet Take 1 tablet (200 mg total) by mouth 3 (three) times daily as needed for pain. 02/15/20    LampteyMyrene Galas, MD    Family History Family History  Problem Relation Age of Onset  . Hypertension Mother   . Heart disease Mother   . Stroke Mother   . Diabetes Maternal Grandmother     Social History Social History   Tobacco Use  . Smoking status: Current Every Day Smoker    Packs/day: 0.10    Types: Cigarettes  . Smokeless tobacco: Never Used  Substance Use Topics  . Alcohol use: Yes    Comment: on weekends  . Drug use: Yes    Types: Marijuana     Allergies   Apple, Banana, Orange fruit [citrus], Lactase-lactobacillus, Spinach, Vicodin [hydrocodone-acetaminophen], and Penicillin g   Review of Systems Review of Systems Pertinent negatives listed in HPI  Physical Exam Triage Vital Signs ED Triage Vitals  Enc Vitals Group     BP 04/24/20 1606 108/73     Pulse Rate 04/24/20 1606 85     Resp 04/24/20 1606 16     Temp 04/24/20 1606 98.3 F (36.8 C)     Temp Source 04/24/20 1606 Oral     SpO2 04/24/20 1606 95 %  Weight --      Height --      Head Circumference --      Peak Flow --      Pain Score 04/24/20 1607 10     Pain Loc --      Pain Edu? --      Excl. in [place]? --    No data found.  Updated Vital Signs BP 108/73   Pulse 85   Temp 98.3 F (36.8 C) (Oral)   Resp 16   SpO2 95%   Visual Acuity Right Eye Distance:   Left Eye Distance:   Bilateral Distance:    Right Eye Near:   Left Eye Near:    Bilateral Near:     Physical Exam General appearance: alert, well developed, well nourished, cooperative and in no distress Head: Normocephalic, without obvious abnormality, atraumatic Respiratory: Respirations even and unlabored, normal respiratory rate Heart: rate and rhythm normal.  Extremities: No gross deformities Skin: Skin color, texture, turgor normal. No rashes seen  Psych: Appropriate mood and affect. UC Treatments / Results  Labs (all labs ordered are listed, but only abnormal results are displayed) Labs Reviewed - No data to  display  EKG   Radiology No results found.  Procedures Procedures (including critical care time)  Medications Ordered in UC Medications - No data to display  Initial Impression / Assessment and Plan / UC Course  I have reviewed the triage vital signs and the nursing notes.  Pertinent labs & imaging results that were available during my care of the patient were reviewed by me and considered in my medical decision making (see chart for details).    Medication Refill Only Patient is without insurance Acyclovir 400 mg TID x 7, may repeat an additional 7 days if symptoms persist. Information given to establish with Primary Care at [facility] Final Clinical Impressions(s) / UC Diagnoses   Final diagnoses:  Medication refill   Discharge Instructions   None    ED Prescriptions    Medication Sig Dispense Auth. Provider   acyclovir (ZOVIRAX) 400 MG tablet Take 1 tablet (400 mg total) by mouth 3 (three) times daily. 50 tablet Bing Neighbors, FNP     PDMP not reviewed this encounter.   Bing Neighbors, FNP 04/24/20 1715

## 2020-05-08 ENCOUNTER — Ambulatory Visit
Admission: RE | Admit: 2020-05-08 | Discharge: 2020-05-08 | Disposition: A | Discharge status: Home / Self Care | Payer: Self-pay | Source: Ambulatory Visit | Attending: Emergency Medicine | Admitting: Emergency Medicine

## 2020-05-08 ENCOUNTER — Other Ambulatory Visit: Payer: Self-pay

## 2020-05-08 VITALS — BP 120/80 | HR 108 | Temp 99.1°F | Resp 18

## 2020-05-08 DIAGNOSIS — L02419 Cutaneous abscess of limb, unspecified: Secondary | ICD-10-CM

## 2020-05-08 MED ORDER — DOXYCYCLINE HYCLATE 100 MG PO CAPS: 100.0000 mg | ORAL_CAPSULE | Freq: Two times a day (BID) | ORAL | 0 refills | Status: DC

## 2020-05-08 NOTE — Discharge Instructions (Signed)
Keep area(s) clean and dry. °Apply hot compress / towel for 5-10 minutes 3-5 times daily. °Take antibiotic as prescribed with food - important to complete course. °Return for worsening pain, redness, swelling, discharge, fever. ° °Helpful prevention tips: °Keep nails short to avoid secondary skin infections. °Use new, clean razors when shaving. °Avoid antiperspirants - look for deodorants without aluminum. °Avoid wearing underwire bras as this can irritate the area further.  °

## 2020-05-08 NOTE — ED Provider Notes (Signed)
[facility]    CSN: [id number] Arrival date & time: 05/08/20  1919      History   Chief Complaint Chief Complaint  Patient presents with  . Abscess    HPI Stephanie Reid is a 29 y.o. female presenting for multiple axillary abscesses.  2 on left arm, 1 on right.  States she has had these frequently in the past, though they have been worse the last few days.  Denies distal arm pain, chest pain, difficulty breathing, fever.  Has use hot compresses without relief.   Past Medical History:  Diagnosis Date  . Bacterial vaginosis   . Herpes simplex   . Hx of chlamydia infection   . Hx of gonorrhea   . Hx of trichomoniasis   . Seasonal allergies     Patient Active Problem List   Diagnosis Date Noted  . Amenorrhea, secondary 10/06/2013    Past Surgical History:  Procedure Laterality Date  . NO PAST SURGERIES      OB History    Gravida  0   Para  0   Term  0   Preterm  0   AB  0   Living  0     SAB  0   TAB  0   Ectopic  0   Multiple  0   Live Births  0            Home Medications    Prior to Admission medications   Medication Sig Start Date End Date Taking? Authorizing Provider  doxycycline (VIBRAMYCIN) 100 MG capsule Take 1 capsule (100 mg total) by mouth 2 (two) times daily. 05/08/20   Hall-Potvin, [place], PA-C    Family History Family History  Problem Relation Age of Onset  . Hypertension Mother   . Heart disease Mother   . Stroke Mother   . Diabetes Maternal Grandmother     Social History Social History   Tobacco Use  . Smoking status: Current Every Day Smoker    Packs/day: 0.10    Types: Cigarettes  . Smokeless tobacco: Never Used  Vaping Use  . Vaping Use: Never used  Substance Use Topics  . Alcohol use: Yes    Comment: on weekends  . Drug use: Yes    Types: Marijuana     Allergies   Apple, Banana, Orange fruit [citrus], Lactase-lactobacillus, Spinach, Vicodin [hydrocodone-acetaminophen], and  Penicillin g   Review of Systems As per HPI   Physical Exam Triage Vital Signs ED Triage Vitals  Enc Vitals Group     BP 05/08/20 1924 120/80     Pulse Rate 05/08/20 1924 (!) 108     Resp 05/08/20 1924 18     Temp 05/08/20 1924 99.1 F (37.3 C)     Temp src --      SpO2 05/08/20 1924 94 %     Weight --      Height --      Head Circumference --      Peak Flow --      Pain Score 05/08/20 1925 10     Pain Loc --      Pain Edu? --      Excl. in [place]? --    No data found.  Updated Vital Signs BP 120/80   Pulse (!) 108   Temp 99.1 F (37.3 C)   Resp 18   LMP  (LMP Unknown)   SpO2 94%   Visual Acuity Right Eye Distance:  Left Eye Distance:   Bilateral Distance:    Right Eye Near:   Left Eye Near:    Bilateral Near:     Physical Exam Constitutional:      General: She is not in acute distress. HENT:     Head: Normocephalic and atraumatic.  Eyes:     General: No scleral icterus.    Pupils: Pupils are equal, round, and reactive to light.  Cardiovascular:     Rate and Rhythm: Normal rate.  Pulmonary:     Effort: Pulmonary effort is normal.  Skin:    Coloration: Skin is not jaundiced or pale.     Comments: Multiple incisional scars under bilateral armpits.  Patient has 1 tender abscess under right arm, 2 under left.  Superior left abscess approximately 2 cm and fluctuant.  No active discharge or open wound.  Neurological:     Mental Status: She is alert and oriented to person, place, and time.      UC Treatments / Results  Labs (all labs ordered are listed, but only abnormal results are displayed) Labs Reviewed - No data to display  EKG   Radiology No results found.  Procedures Incision and Drainage  Date/Time: 05/09/2020 8:02 AM Performed by: Shea Evans, PA-C Authorized by: Shea Evans, PA-C   Consent:    Consent obtained:  Verbal   Consent given by:  Patient   Risks discussed:  Bleeding, incomplete drainage, pain and  damage to other organs   Alternatives discussed:  No treatment Universal protocol:    Patient identity confirmed:  Verbally with patient Location:    Type:  Abscess   Location:  Upper extremity   Upper extremity location: left axilla. Pre-procedure details:    Skin preparation:  Betadine Anesthesia (see MAR for exact dosages):    Anesthesia method:  Local infiltration   Local anesthetic:  Lidocaine 2% WITH epi Procedure type:    Complexity:  Complex Procedure details:    Needle aspiration: yes     Needle size:  22 G   Incision types:  Single straight   Incision depth:  Subcutaneous   Scalpel blade:  11   Wound management:  Probed and deloculated, irrigated with saline and extensive cleaning   Drainage:  Purulent and bloody   Drainage amount:  Copious   Wound treatment:  Wound left open   Packing materials:  1/4 in gauze Post-procedure details:    Patient tolerance of procedure:  Procedure terminated at patient's request Comments:     Numerous loculations   (including critical care time)  Medications Ordered in UC Medications - No data to display  Initial Impression / Assessment and Plan / UC Course  I have reviewed the triage vital signs and the nursing notes.  Pertinent labs & imaging results that were available during my care of the patient were reviewed by me and considered in my medical decision making (see chart for details).     Patient febrile, nontoxic in office today. ? Recurrent axillary abscess vs HS. I&D done in office of most painful lesion: Patient terminated patient's request due to extent of loculations.  Will follow with antibiotics as outlined below.  Provided surgery and dermatology contact information for further evaluation and management.  Return precautions discussed, patient verbalized understanding and is agreeable to plan. Final Clinical Impressions(s) / UC Diagnoses   Final diagnoses:  Abscess, axilla     Discharge Instructions     Keep  area(s) clean and dry. Apply hot compress /  towel for 5-10 minutes 3-5 times daily. Take antibiotic as prescribed with food - important to complete course. Return for worsening pain, redness, swelling, discharge, fever.  Helpful prevention tips: Keep nails short to avoid secondary skin infections. Use new, clean razors when shaving. Avoid antiperspirants - look for deodorants without aluminum. Avoid wearing underwire bras as this can irritate the area further.     ED Prescriptions    Medication Sig Dispense Auth. Provider   doxycycline (VIBRAMYCIN) 100 MG capsule Take 1 capsule (100 mg total) by mouth 2 (two) times daily. 20 capsule Hall-Potvin, [place], PA-C     PDMP not reviewed this encounter.   Hall-Potvin, [place], [place] 05/09/20 [phone]

## 2020-05-08 NOTE — ED Triage Notes (Signed)
Pt c/o 2 abscesses under left arm, 1 abscess under right arm x 3-4 days

## 2020-05-09 DIAGNOSIS — L02419 Cutaneous abscess of limb, unspecified: Secondary | ICD-10-CM

## 2020-06-26 ENCOUNTER — Ambulatory Visit
Admission: EM | Admit: 2020-06-26 | Discharge: 2020-06-26 | Disposition: A | Discharge status: Home / Self Care | Payer: Self-pay | Attending: Physician Assistant | Admitting: Physician Assistant

## 2020-06-26 DIAGNOSIS — T7840XA Allergy, unspecified, initial encounter: Secondary | ICD-10-CM

## 2020-06-26 DIAGNOSIS — L509 Urticaria, unspecified: Secondary | ICD-10-CM

## 2020-06-26 DIAGNOSIS — R591 Generalized enlarged lymph nodes: Secondary | ICD-10-CM

## 2020-06-26 MED ORDER — PREDNISONE 50 MG PO TABS: 50.0000 mg | ORAL_TABLET | Freq: Every day | ORAL | 0 refills | Status: DC

## 2020-06-26 MED ORDER — DEXAMETHASONE SODIUM PHOSPHATE 10 MG/ML IJ SOLN
10.0000 mg | Freq: Once | INTRAMUSCULAR | Status: AC
  Administered 2020-06-26: 10 mg via INTRAMUSCULAR

## 2020-06-26 MED ORDER — FAMOTIDINE 20 MG PO TABS
20.0000 mg | ORAL_TABLET | Freq: Every day | ORAL | Status: DC
  Administered 2020-06-26: 20 mg via ORAL

## 2020-06-26 NOTE — ED Provider Notes (Signed)
[facility]    CSN: [id number] Arrival date & time: 06/26/20  1603      History   Chief Complaint Chief Complaint  Patient presents with   Urticaria    HPI Stephanie Reid is a 29 y.o. female.   29 year old female comes in for acute onset of hives, swelling under the tongue and changes in voice after she had food 45 mins prior to arrival. States ate cheese burger at work, has not had this in awhile. Denies shortness of breath, trouble breathing. Has not taken anything for the symptoms.      Past Medical History:  Diagnosis Date   Bacterial vaginosis    Herpes simplex    Hx of chlamydia infection    Hx of gonorrhea    Hx of trichomoniasis    Seasonal allergies     Patient Active Problem List   Diagnosis Date Noted   Amenorrhea, secondary 10/06/2013    Past Surgical History:  Procedure Laterality Date   NO PAST SURGERIES      OB History    Gravida  0   Para  0   Term  0   Preterm  0   AB  0   Living  0     SAB  0   TAB  0   Ectopic  0   Multiple  0   Live Births  0            Home Medications    Prior to Admission medications   Medication Sig Start Date End Date Taking? Authorizing Provider  predniSONE (DELTASONE) 50 MG tablet Take 1 tablet (50 mg total) by mouth daily with breakfast. 06/26/20   Belinda Fisher, PA-C    Family History Family History  Problem Relation Age of Onset   Hypertension Mother    Heart disease Mother    Stroke Mother    Diabetes Maternal Grandmother     Social History Social History   Tobacco Use   Smoking status: Current Every Day Smoker    Packs/day: 0.10    Types: Cigarettes   Smokeless tobacco: Never Used  Vaping Use   Vaping Use: Never used  Substance Use Topics   Alcohol use: Yes    Comment: on weekends   Drug use: Yes    Types: Marijuana     Allergies   Apple, Banana, Orange fruit [citrus], Lactase-lactobacillus, Spinach, Vicodin  [hydrocodone-acetaminophen], and Penicillin g   Review of Systems Review of Systems  Reason unable to perform ROS: See HPI as above.     Physical Exam Triage Vital Signs ED Triage Vitals  Enc Vitals Group     BP 06/26/20 1620 117/82     Pulse Rate 06/26/20 1620 92     Resp 06/26/20 1620 18     Temp 06/26/20 1620 98.2 F (36.8 C)     Temp Source 06/26/20 1620 Oral     SpO2 06/26/20 1620 95 %     Weight --      Height --      Head Circumference --      Peak Flow --      Pain Score 06/26/20 1631 0     Pain Loc --      Pain Edu? --      Excl. in [place]? --    No data found.  Updated Vital Signs BP 117/82 (BP Location: Right Arm)    Pulse 92    Temp 98.2  F (36.8 C) (Oral)    Resp 18    SpO2 95%   Physical Exam Constitutional:      General: She is not in acute distress.    Appearance: Normal appearance. She is well-developed. She is not toxic-appearing or diaphoretic.  HENT:     Head: Normocephalic and atraumatic.     Mouth/Throat:     Mouth: Mucous membranes are moist.     Pharynx: Oropharynx is clear. Uvula midline.     Comments: No oral swelling. Floor of mouth soft to palpation.  Eyes:     Conjunctiva/sclera: Conjunctivae normal.     Pupils: Pupils are equal, round, and reactive to light.  Cardiovascular:     Rate and Rhythm: Normal rate and regular rhythm.  Pulmonary:     Effort: Pulmonary effort is normal. No respiratory distress.     Comments: LCTAB Musculoskeletal:     Cervical back: Normal range of motion and neck supple.  Lymphadenopathy:     Cervical: Cervical adenopathy (right) present.  Skin:    General: Skin is warm and dry.     Comments: No hives/rashes seen  Neurological:     Mental Status: She is alert and oriented to person, place, and time.      UC Treatments / Results  Labs (all labs ordered are listed, but only abnormal results are displayed) Labs Reviewed - No data to display  EKG   Radiology No results  found.  Procedures Procedures (including critical care time)  Medications Ordered in UC Medications  famotidine (PEPCID) tablet 20 mg (20 mg Oral Given 06/26/20 1707)  dexamethasone (DECADRON) injection 10 mg (10 mg Intramuscular Given 06/26/20 1708)    Initial Impression / Assessment and Plan / UC Course  I have reviewed the triage vital signs and the nursing notes.  Pertinent labs & imaging results that were available during my care of the patient were reviewed by me and considered in my medical decision making (see chart for details).    No hives noted on my exam, though hives are present during triage.  Patient speaking full sentences without difficulty.  She is handling own secretions well.  Stable vitals.  Lungs clear to auscultation bilaterally without adventitious lung sounds.  Discussed history and exam consistent with allergic reaction, currently not worrisome for anaphylactic reaction.  Will treat with Decadron, famotidine. Patient needing to drive home on own, so will take benadryl at home. Prednisone as directed. Strict return precautions given.  Final Clinical Impressions(s) / UC Diagnoses   Final diagnoses:  Hives  Lymphadenopathy of head and neck  Allergic reaction, initial encounter   ED Prescriptions    Medication Sig Dispense Auth. Provider   predniSONE (DELTASONE) 50 MG tablet Take 1 tablet (50 mg total) by mouth daily with breakfast. 5 tablet Belinda Fisher, PA-C     PDMP not reviewed this encounter.   Belinda Fisher, PA-C 06/26/20 1801

## 2020-06-26 NOTE — Discharge Instructions (Signed)
Decadron, pepcid in office today. Take benadryl or zyrtec. Prednisone as directed. Monitor for any worsening of symptoms, trouble breathing, trouble swallowing, swelling of the throat, leaning forward to breath, drooling, follow up here or at the emergency department for reevaluation.

## 2020-06-26 NOTE — ED Triage Notes (Signed)
Pt states after she ate ago she has developed hives to face, arms, and chest. States has swelling under her tongue and lost her voice. Denies difficulty swallowing or SOB. Pt speaking in complete sentences. States ate a bacon double cheese burger from where she works at [employer].

## 2020-09-03 ENCOUNTER — Emergency Department (HOSPITAL_COMMUNITY)
Admission: EM | Admit: 2020-09-03 | Discharge: 2020-09-03 | Disposition: A | Discharge status: Home / Self Care | Payer: Self-pay | Attending: Emergency Medicine | Admitting: Emergency Medicine

## 2020-09-03 ENCOUNTER — Emergency Department (HOSPITAL_COMMUNITY): Payer: Self-pay

## 2020-09-03 ENCOUNTER — Encounter (HOSPITAL_COMMUNITY): Payer: Self-pay

## 2020-09-03 ENCOUNTER — Other Ambulatory Visit: Payer: Self-pay

## 2020-09-03 DIAGNOSIS — R509 Fever, unspecified: Secondary | ICD-10-CM | POA: Insufficient documentation

## 2020-09-03 DIAGNOSIS — M791 Myalgia, unspecified site: Secondary | ICD-10-CM | POA: Insufficient documentation

## 2020-09-03 DIAGNOSIS — F17210 Nicotine dependence, cigarettes, uncomplicated: Secondary | ICD-10-CM | POA: Insufficient documentation

## 2020-09-03 DIAGNOSIS — R059 Cough, unspecified: Secondary | ICD-10-CM | POA: Insufficient documentation

## 2020-09-03 DIAGNOSIS — Z20822 Contact with and (suspected) exposure to covid-19: Secondary | ICD-10-CM | POA: Insufficient documentation

## 2020-09-03 DIAGNOSIS — R110 Nausea: Secondary | ICD-10-CM | POA: Insufficient documentation

## 2020-09-03 LAB — CBC
HCT: 39.1 % (ref 36.0–46.0)
Hemoglobin: 13.2 g/dL (ref 12.0–15.0)
MCH: 29.1 pg (ref 26.0–34.0)
MCHC: 33.8 g/dL (ref 30.0–36.0)
MCV: 86.3 fL (ref 80.0–100.0)
Platelets: 241 10*3/uL (ref 150–400)
RBC: 4.53 MIL/uL (ref 3.87–5.11)
RDW: 12.5 % (ref 11.5–15.5)
WBC: 10.3 10*3/uL (ref 4.0–10.5)
nRBC: 0 % (ref 0.0–0.2)

## 2020-09-03 LAB — RESPIRATORY PANEL BY RT PCR (FLU A&B, COVID)
Influenza A by PCR: NEGATIVE
Influenza B by PCR: NEGATIVE
SARS Coronavirus 2 by RT PCR: NEGATIVE

## 2020-09-03 LAB — URINALYSIS, ROUTINE W REFLEX MICROSCOPIC
Bacteria, UA: NONE SEEN
Bilirubin Urine: NEGATIVE
Glucose, UA: NEGATIVE mg/dL
Hgb urine dipstick: NEGATIVE
Ketones, ur: NEGATIVE mg/dL
Nitrite: NEGATIVE
Protein, ur: NEGATIVE mg/dL
Specific Gravity, Urine: 1.011 (ref 1.005–1.030)
pH: 6 (ref 5.0–8.0)

## 2020-09-03 LAB — COMPREHENSIVE METABOLIC PANEL
ALT: 27 U/L (ref 0–44)
AST: 24 U/L (ref 15–41)
Albumin: 4 g/dL (ref 3.5–5.0)
Alkaline Phosphatase: 67 U/L (ref 38–126)
Anion gap: 9 (ref 5–15)
BUN: 8 mg/dL (ref 6–20)
CO2: 25 mmol/L (ref 22–32)
Calcium: 9.2 mg/dL (ref 8.9–10.3)
Chloride: 103 mmol/L (ref 98–111)
Creatinine, Ser: 0.78 mg/dL (ref 0.44–1.00)
GFR, Estimated: >60 mL/min (ref >60)
Glucose, Bld: 107 mg/dL — ABNORMAL HIGH (ref 70–99)
Potassium: 3.6 mmol/L (ref 3.5–5.1)
Sodium: 137 mmol/L (ref 135–145)
Total Bilirubin: 0.7 mg/dL (ref 0.3–1.2)
Total Protein: 8.5 g/dL — ABNORMAL HIGH (ref 6.5–8.1)

## 2020-09-03 LAB — I-STAT BETA HCG BLOOD, ED (MC, WL, AP ONLY): I-stat hCG, quantitative: <5 m[IU]/mL (ref <5)

## 2020-09-03 LAB — LIPASE, BLOOD: Lipase: 25 U/L (ref 11–51)

## 2020-09-03 MED ORDER — IBUPROFEN 200 MG PO TABS
400.0000 mg | ORAL_TABLET | Freq: Once | ORAL | Status: AC
  Administered 2020-09-03: 400 mg via ORAL
  Filled 2020-09-03: qty 2

## 2020-09-03 MED ORDER — NAPROXEN 375 MG PO TABS: 375.0000 mg | ORAL_TABLET | Freq: Two times a day (BID) | ORAL | 0 refills | Status: DC

## 2020-09-03 MED ORDER — ACETAMINOPHEN 325 MG PO TABS
650.0000 mg | ORAL_TABLET | Freq: Once | ORAL | Status: AC
  Administered 2020-09-03: 650 mg via ORAL
  Filled 2020-09-03: qty 2

## 2020-09-03 NOTE — Discharge Instructions (Addendum)
Take Tylenol and Naprosyn for fever.  Drink plenty of fluids and rest.  Follow-up with your doctor next week to be rechecked.  Your Covid and flu test tonight were negative but consider getting retested later this week if the symptoms persist.

## 2020-09-03 NOTE — ED Provider Notes (Signed)
[facility]-EMERGENCY DEPT Provider Note   CSN: [id number] Arrival date & time: 09/03/20  1541     History No chief complaint on file.   Stephanie Reid is a 29 y.o. female.  HPI   Patient presents to the ED for evaluation of fevers, body aches and cough.  Patient states she started developing her symptoms today.  She has had nausea but no vomiting.  She is having diffuse body aches.  She has been coughing.  No sore throat or dysuria.  No shortness of breath.  Patient denies any dysuria.  Patient has not been vaccinated for Covid.  Past Medical History:  Diagnosis Date  . Bacterial vaginosis   . Herpes simplex   . Hx of chlamydia infection   . Hx of gonorrhea   . Hx of trichomoniasis   . Seasonal allergies     Patient Active Problem List   Diagnosis Date Noted  . Amenorrhea, secondary 10/06/2013    Past Surgical History:  Procedure Laterality Date  . NO PAST SURGERIES       OB History    Gravida  0   Para  0   Term  0   Preterm  0   AB  0   Living  0     SAB  0   TAB  0   Ectopic  0   Multiple  0   Live Births  0           Family History  Problem Relation Age of Onset  . Hypertension Mother   . Heart disease Mother   . Stroke Mother   . Diabetes Maternal Grandmother     Social History   Tobacco Use  . Smoking status: Current Every Day Smoker    Packs/day: 0.10    Types: Cigarettes  . Smokeless tobacco: Never Used  Vaping Use  . Vaping Use: Never used  Substance Use Topics  . Alcohol use: Yes    Comment: on weekends  . Drug use: Yes    Types: Marijuana    Home Medications Prior to Admission medications   Medication Sig Start Date End Date Taking? Authorizing Provider  acetaminophen (TYLENOL) 500 MG tablet Take 500 mg by mouth every 6 (six) hours as needed for fever.   Yes [provider]  ibuprofen (ADVIL) 200 MG tablet Take 200 mg by mouth every 6 (six) hours as needed for fever.   Yes  [provider]  naproxen (NAPROSYN) 375 MG tablet Take 1 tablet (375 mg total) by mouth 2 (two) times daily. 09/03/20   Linwood Dibbles, MD  predniSONE (DELTASONE) 50 MG tablet Take 1 tablet (50 mg total) by mouth daily with breakfast. Patient not taking: Reported on 09/03/2020 06/26/20   Belinda Fisher, PA-C    Allergies    Apple, Banana, Orange fruit [citrus], Lactase-lactobacillus, Spinach, Vicodin [hydrocodone-acetaminophen], and Penicillin g  Review of Systems   Review of Systems  All other systems reviewed and are negative.   Physical Exam Updated Vital Signs BP 97/62 (BP Location: Right Arm)   Pulse 78   Temp 99.5 F (37.5 C) (Oral)   Resp 14   Ht 1.651 m (5\' 5" )   Wt 99.8 kg   SpO2 97%   BMI 36.61 kg/m   Physical Exam Vitals and nursing note reviewed.  Constitutional:      General: She is not in acute distress.    Appearance: She is well-developed. She is ill-appearing.  HENT:     Head: Normocephalic and atraumatic.     Right Ear: External ear normal.     Left Ear: External ear normal.  Eyes:     General: No scleral icterus.       Right eye: No discharge.        Left eye: No discharge.     Conjunctiva/sclera: Conjunctivae normal.  Neck:     Trachea: No tracheal deviation.  Cardiovascular:     Rate and Rhythm: Normal rate and regular rhythm.  Pulmonary:     Effort: Pulmonary effort is normal. No respiratory distress.     Breath sounds: Normal breath sounds. No stridor. No wheezing or rales.  Abdominal:     General: Bowel sounds are normal. There is no distension.     Palpations: Abdomen is soft.     Tenderness: There is no abdominal tenderness. There is no guarding or rebound.  Musculoskeletal:        General: No tenderness.     Cervical back: Neck supple.  Skin:    General: Skin is warm and dry.     Findings: No rash.  Neurological:     Mental Status: She is alert.     Cranial Nerves: No cranial nerve deficit (no facial droop, extraocular movements  intact, no slurred speech).     Sensory: No sensory deficit.     Motor: No abnormal muscle tone or seizure activity.     Coordination: Coordination normal.     ED Results / Procedures / Treatments   Labs (all labs ordered are listed, but only abnormal results are displayed) Labs Reviewed  COMPREHENSIVE METABOLIC PANEL - Abnormal; Notable for the following components:      Result Value   Glucose, Bld 107 (*)    Total Protein 8.5 (*)    All other components within normal limits  URINALYSIS, ROUTINE W REFLEX MICROSCOPIC - Abnormal; Notable for the following components:   Leukocytes,Ua TRACE (*)    All other components within normal limits  RESPIRATORY PANEL BY RT PCR (FLU A&B, COVID)  LIPASE, BLOOD  CBC  I-STAT BETA HCG BLOOD, ED ([facility], [facility], [facility] ONLY)    EKG None  Radiology DG Chest Portable 1 View  Result Date: 09/03/2020 CLINICAL DATA:  Cough, fever EXAM: PORTABLE CHEST 1 VIEW COMPARISON:  Radiograph 12/22/2017 FINDINGS: No consolidation, features of edema, pneumothorax, or effusion. Pulmonary vascularity is normally distributed. The cardiomediastinal contours are unremarkable. No acute osseous or soft tissue abnormality. Mild dextrocurvature of the thoracic spine is similar to priors. IMPRESSION: No acute cardiopulmonary abnormality. Electronically Signed   By: Kreg Shropshire M.D.   On: 09/03/2020 21:10    Procedures Procedures (including critical care time)  Medications Ordered in ED Medications  acetaminophen (TYLENOL) tablet 650 mg (650 mg Oral Given 09/03/20 1755)  ibuprofen (ADVIL) tablet 400 mg (400 mg Oral Given 09/03/20 2110)    ED Course  I have reviewed the triage vital signs and the nursing notes.  Pertinent labs & imaging results that were available during my care of the patient were reviewed by me and considered in my medical decision making (see chart for details).    MDM Rules/Calculators/A&P                         Stephanie Reid was evaluated in  Emergency Department on 09/03/2020 for the symptoms described in the history of present illness. She was evaluated in the context of the global  COVID-19 pandemic, which necessitated consideration that the patient might be at risk for infection with the SARS-CoV-2 virus that causes COVID-19. Institutional protocols and algorithms that pertain to the evaluation of patients at risk for COVID-19 are in a state of rapid change based on information released by regulatory bodies including the CDC and federal and state organizations. These policies and algorithms were followed during the patient's care in the ED. Patient presented to the ED with complaints of fever myalgias body aches.  She denies any other focal symptoms such as sore throat abdominal pain or rashes.  Symptoms were concerning for the possibility of COVID-19.  However her flu and Covid tests are negative.  She does not have a urinary tract infection.  No pneumonia.  Laboratory tests are otherwise reassuring.  Suspect viral illness.  Will discharge home on supportive care.  Suggested repeat Covid testing later this week if her symptoms persist. Final Clinical Impression(s) / ED Diagnoses Final diagnoses:  Acute febrile illness    Rx / DC Orders ED Discharge Orders         Ordered    naproxen (NAPROSYN) 375 MG tablet  2 times daily        09/03/20 2243           Linwood Dibbles, MD 09/03/20 2248

## 2020-11-22 ENCOUNTER — Ambulatory Visit: Admit: 2020-11-22 | Discharge status: Absent without leave | Payer: Self-pay

## 2020-11-23 DIAGNOSIS — Z20822 Contact with and (suspected) exposure to covid-19: Secondary | ICD-10-CM | POA: Insufficient documentation

## 2020-11-23 DIAGNOSIS — R509 Fever, unspecified: Secondary | ICD-10-CM | POA: Insufficient documentation

## 2020-11-23 DIAGNOSIS — Z5321 Procedure and treatment not carried out due to patient leaving prior to being seen by health care provider: Secondary | ICD-10-CM | POA: Insufficient documentation

## 2020-11-23 DIAGNOSIS — R519 Headache, unspecified: Secondary | ICD-10-CM | POA: Insufficient documentation

## 2020-11-24 ENCOUNTER — Encounter (HOSPITAL_COMMUNITY): Payer: Self-pay | Admitting: Emergency Medicine

## 2020-11-24 ENCOUNTER — Emergency Department (HOSPITAL_COMMUNITY)
Admission: EM | Admit: 2020-11-24 | Discharge: 2020-11-24 | Disposition: A | Discharge status: Home / Self Care | Payer: Self-pay | Attending: Emergency Medicine | Admitting: Emergency Medicine

## 2020-11-24 LAB — RESP PANEL BY RT-PCR (FLU A&B, COVID) ARPGX2
Influenza A by PCR: NEGATIVE
Influenza B by PCR: NEGATIVE
SARS Coronavirus 2 by RT PCR: NEGATIVE

## 2020-11-24 NOTE — ED Triage Notes (Signed)
Patient here from home reporting headache, body aches, and fever that started 3 days ago. Reports that people have tested positive at her job.

## 2020-12-04 ENCOUNTER — Encounter: Payer: Self-pay | Admitting: Emergency Medicine

## 2020-12-04 ENCOUNTER — Ambulatory Visit
Admission: EM | Admit: 2020-12-04 | Discharge: 2020-12-04 | Disposition: A | Discharge status: Home / Self Care | Payer: Self-pay | Attending: Internal Medicine | Admitting: Internal Medicine

## 2020-12-04 ENCOUNTER — Other Ambulatory Visit: Payer: Self-pay

## 2020-12-04 ENCOUNTER — Emergency Department (HOSPITAL_COMMUNITY): Payer: HRSA Program

## 2020-12-04 ENCOUNTER — Emergency Department (HOSPITAL_COMMUNITY)
Admission: EM | Admit: 2020-12-04 | Discharge: 2020-12-05 | Disposition: A | Discharge status: Home / Self Care | Payer: HRSA Program | Attending: Emergency Medicine | Admitting: Emergency Medicine

## 2020-12-04 DIAGNOSIS — R059 Cough, unspecified: Secondary | ICD-10-CM | POA: Diagnosis present

## 2020-12-04 DIAGNOSIS — U071 COVID-19: Secondary | ICD-10-CM | POA: Diagnosis not present

## 2020-12-04 DIAGNOSIS — J9601 Acute respiratory failure with hypoxia: Secondary | ICD-10-CM

## 2020-12-04 DIAGNOSIS — J189 Pneumonia, unspecified organism: Secondary | ICD-10-CM

## 2020-12-04 DIAGNOSIS — J1282 Pneumonia due to coronavirus disease 2019: Secondary | ICD-10-CM | POA: Insufficient documentation

## 2020-12-04 DIAGNOSIS — Z20822 Contact with and (suspected) exposure to covid-19: Secondary | ICD-10-CM

## 2020-12-04 DIAGNOSIS — F17210 Nicotine dependence, cigarettes, uncomplicated: Secondary | ICD-10-CM | POA: Insufficient documentation

## 2020-12-04 MED ORDER — ACETAMINOPHEN 325 MG PO TABS
650.0000 mg | ORAL_TABLET | Freq: Once | ORAL | Status: AC
  Administered 2020-12-04: 650 mg via ORAL

## 2020-12-04 NOTE — ED Triage Notes (Signed)
Pt here for cold sx onset 3 days associated w/cough, vomiting, diarrhea, abd pain, decreased appetite body aches  Taking OTC Nyquil w/temp relief  A&O x4... NAD.Marland Kitchen. ambulatory

## 2020-12-04 NOTE — Discharge Instructions (Signed)
You will require higher level of care including further lab work, supplemental oxygen and IV fluids at the minimum.  I recommend you go to the emergency department to be evaluated and managed further.

## 2020-12-04 NOTE — ED Triage Notes (Addendum)
Patient reports fever with chills , body aches , fatigue , emesis and runny nose onset this week .

## 2020-12-05 LAB — SARS CORONAVIRUS 2 (TAT 6-24 HRS): SARS Coronavirus 2: POSITIVE — AB

## 2020-12-05 MED ORDER — ONDANSETRON 4 MG PO TBDP
4.0000 mg | ORAL_TABLET | Freq: Once | ORAL | Status: AC
  Administered 2020-12-05: 4 mg via ORAL
  Filled 2020-12-05: qty 1

## 2020-12-05 MED ORDER — DOXYCYCLINE HYCLATE 100 MG PO CAPS: 100.0000 mg | ORAL_CAPSULE | Freq: Two times a day (BID) | ORAL | 0 refills | Status: DC

## 2020-12-05 MED ORDER — ALBUTEROL SULFATE HFA 108 (90 BASE) MCG/ACT IN AERS
2.0000 | INHALATION_SPRAY | RESPIRATORY_TRACT | Status: DC | PRN
  Administered 2020-12-05: 2 via RESPIRATORY_TRACT
  Filled 2020-12-05: qty 6.7

## 2020-12-05 MED ORDER — AMOXICILLIN 500 MG PO CAPS
1000.0000 mg | ORAL_CAPSULE | Freq: Once | ORAL | Status: AC
  Administered 2020-12-05: 1000 mg via ORAL
  Filled 2020-12-05: qty 2

## 2020-12-05 MED ORDER — BENZONATATE 100 MG PO CAPS: 100.0000 mg | ORAL_CAPSULE | Freq: Three times a day (TID) | ORAL | 0 refills | Status: DC

## 2020-12-05 MED ORDER — ONDANSETRON 4 MG PO TBDP: 4.0000 mg | ORAL_TABLET | Freq: Three times a day (TID) | ORAL | 0 refills | Status: DC | PRN

## 2020-12-05 NOTE — ED Notes (Signed)
Pt ambulated well. )2 saturation stayed above 96% throughout ambulation. Pt endorses SOB and nausea.

## 2020-12-05 NOTE — ED Provider Notes (Signed)
[facility] EMERGENCY DEPARTMENT Provider Note   CSN: [id number] Arrival date & time: 12/04/20  2111     History Chief Complaint  Patient presents with  . Chills/Body Aches/Runny Nose/Emesis    Unvaccinated    Stephanie Reid is a 30 y.o. female.  Patient to ED with symptoms of significant body aches, cough, congestion, nausea, vomiting and diarrhea. Symptoms started 2-3 days ago. No known sick contacts. She is unvaccinated against COVID. Otherwise healthy, denies regular medications of any kind. No urinary symptoms. She has been eating and drinking less secondary to nausea. No lightheadedness, syncope.   The history is provided by the patient. No language interpreter was used.       Past Medical History:  Diagnosis Date  . Bacterial vaginosis   . Herpes simplex   . Hx of chlamydia infection   . Hx of gonorrhea   . Hx of trichomoniasis   . Seasonal allergies     Patient Active Problem List   Diagnosis Date Noted  . Amenorrhea, secondary 10/06/2013    Past Surgical History:  Procedure Laterality Date  . NO PAST SURGERIES       OB History    Gravida  0   Para  0   Term  0   Preterm  0   AB  0   Living  0     SAB  0   IAB  0   Ectopic  0   Multiple  0   Live Births  0           Family History  Problem Relation Age of Onset  . Hypertension Mother   . Heart disease Mother   . Stroke Mother   . Diabetes Maternal Grandmother     Social History   Tobacco Use  . Smoking status: Current Every Day Smoker    Packs/day: 0.10    Types: Cigarettes  . Smokeless tobacco: Never Used  Vaping Use  . Vaping Use: Never used  Substance Use Topics  . Alcohol use: Yes    Comment: on weekends  . Drug use: Yes    Types: Marijuana    Home Medications Prior to Admission medications   Medication Sig Start Date End Date Taking? Authorizing Provider  acetaminophen (TYLENOL) 500 MG tablet Take 500 mg by mouth every 6 (six) hours  as needed for fever.    [provider]  ibuprofen (ADVIL) 200 MG tablet Take 200 mg by mouth every 6 (six) hours as needed for fever.    [provider]  naproxen (NAPROSYN) 375 MG tablet Take 1 tablet (375 mg total) by mouth 2 (two) times daily. 09/03/20   Linwood Dibbles, MD  predniSONE (DELTASONE) 50 MG tablet Take 1 tablet (50 mg total) by mouth daily with breakfast. Patient not taking: Reported on 09/03/2020 06/26/20   Belinda Fisher, PA-C    Allergies    Apple, Banana, Orange fruit [citrus], Lactase-lactobacillus, Spinach, Vicodin [hydrocodone-acetaminophen], and Penicillin g  Review of Systems   Review of Systems  Constitutional: Positive for activity change, appetite change, chills, fatigue and fever.  HENT: Positive for congestion and sore throat.   Eyes: Negative for discharge.  Respiratory: Positive for cough, chest tightness and shortness of breath.   Cardiovascular: Negative.   Gastrointestinal: Positive for diarrhea and vomiting.  Genitourinary: Negative for dysuria.  Musculoskeletal: Positive for myalgias.  Skin: Positive for color change and rash.  Neurological: Positive for weakness.    Physical Exam  Updated Vital Signs BP 108/69 (BP Location: Right Arm)   Pulse 87   Temp 99.5 F (37.5 C) (Oral)   Resp 20   SpO2 98%   Physical Exam Vitals and nursing note reviewed.  Constitutional:      General: She is not in acute distress.    Appearance: She is well-developed and well-nourished. She is ill-appearing. She is not toxic-appearing.  HENT:     Head: Normocephalic.  Cardiovascular:     Rate and Rhythm: Normal rate and regular rhythm.     Heart sounds: No murmur heard.   Pulmonary:     Effort: Pulmonary effort is normal.     Breath sounds: Normal breath sounds. No wheezing, rhonchi or rales.  Abdominal:     General: Bowel sounds are normal.     Palpations: Abdomen is soft.     Tenderness: There is no abdominal tenderness. There is no guarding or  rebound.  Musculoskeletal:        General: Normal range of motion.     Cervical back: Normal range of motion and neck supple.  Skin:    General: Skin is warm and dry.     Findings: No rash.  Neurological:     Mental Status: She is alert and oriented to person, place, and time.  Psychiatric:        Mood and Affect: Mood and affect normal.     ED Results / Procedures / Treatments   Labs (all labs ordered are listed, but only abnormal results are displayed) Labs Reviewed  SARS CORONAVIRUS 2 (TAT 6-24 HRS) - Abnormal; Notable for the following components:      Result Value   SARS Coronavirus 2 POSITIVE (*)    All other components within normal limits    EKG None  Radiology DG Chest Portable 1 View  Result Date: 12/04/2020 CLINICAL DATA:  Cough, chills, body aches EXAM: PORTABLE CHEST 1 VIEW COMPARISON:  09/03/2020 FINDINGS: Airspace disease in the right lower lobe compatible with pneumonia. Left lung clear. Heart is normal size. Low lung volumes. No effusions or acute bony abnormality. IMPRESSION: Right lower lobe consolidation compatible with pneumonia. Electronically Signed   By: Charlett Nose M.D.   On: 12/04/2020 23:00    Procedures Procedures (including critical care time)  Medications Ordered in ED Medications  amoxicillin (AMOXIL) capsule 1,000 mg (has no administration in time range)  albuterol (VENTOLIN HFA) 108 (90 Base) MCG/ACT inhaler 2 puff (has no administration in time range)  ondansetron (ZOFRAN-ODT) disintegrating tablet 4 mg (4 mg Oral Given 12/05/20 0120)    ED Course  I have reviewed the triage vital signs and the nursing notes.  Pertinent labs & imaging results that were available during my care of the patient were reviewed by me and considered in my medical decision making (see chart for details).    MDM Rules/Calculators/A&P                          Patient to ED with ss/sxs as per HPI.   She is ill appearing but nontoxic. Awake, alert, appears  fatigued. Actively coughing. Actively vomiting.   Zofran provided with relief of nausea. She subsequently tolerates PO fluids without recurrent vomiting.   COVID is resulted and is positive. CXR c/w pneumonia that is unilateral, unilobar. Feel this is inconsistent with COVID pneumonia and will start antibiotics. Other labs are essentially unremarkable.   She is ambulatory without desaturation. Will provide supportive care -  Albuterol inhaler prn, Tussionex for cough, Will treat with doxycycline x 10 days.   Return precautions discussed.  Final Clinical Impression(s) / ED Diagnoses Final diagnoses:  None   1. COVID positive 2. LLL PNA   Rx / DC Orders ED Discharge Orders    None       Danne Harbor 12/05/20 0314    Dione Booze, MD 12/05/20 [phone]

## 2020-12-05 NOTE — Discharge Instructions (Addendum)
You have been diagnosed with COVID-19 as well as a bacterial pneumonia.   Use the Albuterol inhaler if this helps with mild shortness or breath and/or cough; Tessalon for cough; Doxycycline for pneumonia; Zofran for nausea/vomiting. Be sure to drink lots of fluids to avoid dehydration which will only make you feel worse.   Return to the emergency department with any worsening symptoms: uncontrolled fever or vomiting, bloody stools or vomit, significant shortness of breath or for new concern.

## 2020-12-05 NOTE — ED Provider Notes (Signed)
[facility]    CSN: [id number] Arrival date & time: 12/04/20  1500      History   Chief Complaint Chief Complaint  Patient presents with  . URI    HPI Stephanie Reid is a 30 y.o. female comes to the urgent care with 3-day history of cough, vomiting, diarrhea and shortness of breath.  Patient's symptoms started insidiously and has been progressively worsening.  Patient has a fever and chills.  She also describes generalized body aches, decreased appetite and diarrhea over the past few days.  She feels fatigued.  She is not vaccinated against COVID-19 virus.  She denies any exposures to COVID-19 positive individuals.  She tried over-the-counter medications with no improvement in her symptoms and hence the visit to the urgent care.  In the urgent care patient looks acutely ill, she is tachycardic, febrile and tachypneic.  Pulse oximetry was fluctuating between 91% and 96% depending on activity.Marland Kitchen   HPI  Past Medical History:  Diagnosis Date  . Bacterial vaginosis   . Herpes simplex   . Hx of chlamydia infection   . Hx of gonorrhea   . Hx of trichomoniasis   . Seasonal allergies     Patient Active Problem List   Diagnosis Date Noted  . Amenorrhea, secondary 10/06/2013    Past Surgical History:  Procedure Laterality Date  . NO PAST SURGERIES      OB History    Gravida  0   Para  0   Term  0   Preterm  0   AB  0   Living  0     SAB  0   IAB  0   Ectopic  0   Multiple  0   Live Births  0            Home Medications    Prior to Admission medications   Medication Sig Start Date End Date Taking? Authorizing Provider  acetaminophen (TYLENOL) 500 MG tablet Take 500 mg by mouth every 6 (six) hours as needed for fever.    [provider]  benzonatate (TESSALON) 100 MG capsule Take 1 capsule (100 mg total) by mouth every 8 (eight) hours. 12/05/20   Elpidio Anis, PA-C  doxycycline (VIBRAMYCIN) 100 MG capsule Take 1 capsule (100  mg total) by mouth 2 (two) times daily. 12/05/20   Elpidio Anis, PA-C  ibuprofen (ADVIL) 200 MG tablet Take 200 mg by mouth every 6 (six) hours as needed for fever.    [provider]  naproxen (NAPROSYN) 375 MG tablet Take 1 tablet (375 mg total) by mouth 2 (two) times daily. 09/03/20   Linwood Dibbles, MD  ondansetron (ZOFRAN ODT) 4 MG disintegrating tablet Take 1 tablet (4 mg total) by mouth every 8 (eight) hours as needed for nausea or vomiting. 12/05/20   Elpidio Anis, PA-C  predniSONE (DELTASONE) 50 MG tablet Take 1 tablet (50 mg total) by mouth daily with breakfast. Patient not taking: Reported on 09/03/2020 06/26/20   Belinda Fisher, PA-C    Family History Family History  Problem Relation Age of Onset  . Hypertension Mother   . Heart disease Mother   . Stroke Mother   . Diabetes Maternal Grandmother     Social History Social History   Tobacco Use  . Smoking status: Current Every Day Smoker    Packs/day: 0.10    Types: Cigarettes  . Smokeless tobacco: Never Used  Vaping Use  . Vaping Use: Never used  Substance Use Topics  . Alcohol use: Yes    Comment: on weekends  . Drug use: Yes    Types: Marijuana     Allergies   Apple, Banana, Orange fruit [citrus], Lactase-lactobacillus, Spinach, Vicodin [hydrocodone-acetaminophen], and Penicillin g   Review of Systems Review of Systems  Constitutional: Positive for activity change, appetite change, chills, fatigue and fever.  HENT: Positive for congestion and sore throat.   Respiratory: Positive for cough and shortness of breath. Negative for chest tightness and wheezing.   Gastrointestinal: Positive for diarrhea and vomiting. Negative for abdominal pain and nausea.  Musculoskeletal: Positive for arthralgias and myalgias.  Skin: Negative.   Neurological: Positive for dizziness and headaches.     Physical Exam Triage Vital Signs ED Triage Vitals  Enc Vitals Group     BP 12/04/20 2014 121/83     Pulse Rate 12/04/20  2014 (!) 107     Resp 12/04/20 2014 (!) 22     Temp 12/04/20 2014 (!) 100.9 F (38.3 C)     Temp Source 12/04/20 2014 Oral     SpO2 12/04/20 2014 94 %     Weight --      Height --      Head Circumference --      Peak Flow --      Pain Score 12/04/20 1904 7     Pain Loc --      Pain Edu? --      Excl. in GC? --    No data found.  Updated Vital Signs BP 121/83 (BP Location: Left Arm)   Pulse (!) 107   Temp (!) 100.9 F (38.3 C) (Oral)   Resp (!) 22   SpO2 96%   Visual Acuity Right Eye Distance:   Left Eye Distance:   Bilateral Distance:    Right Eye Near:   Left Eye Near:    Bilateral Near:     Physical Exam Vitals and nursing note reviewed.  Constitutional:      Appearance: She is not ill-appearing.  HENT:     Right Ear: Tympanic membrane normal.     Left Ear: Tympanic membrane normal.     Nose: Congestion present.     Mouth/Throat:     Pharynx: Posterior oropharyngeal erythema present.  Cardiovascular:     Rate and Rhythm: Regular rhythm. Tachycardia present.     Pulses: Normal pulses.  Pulmonary:     Effort: Respiratory distress present.     Breath sounds: No wheezing or rhonchi.  Abdominal:     General: Bowel sounds are normal.     Palpations: Abdomen is soft.  Neurological:     Mental Status: She is alert.      UC Treatments / Results  Labs (all labs ordered are listed, but only abnormal results are displayed) Labs Reviewed - No data to display  EKG   Radiology DG Chest Portable 1 View  Result Date: 12/04/2020 CLINICAL DATA:  Cough, chills, body aches EXAM: PORTABLE CHEST 1 VIEW COMPARISON:  09/03/2020 FINDINGS: Airspace disease in the right lower lobe compatible with pneumonia. Left lung clear. Heart is normal size. Low lung volumes. No effusions or acute bony abnormality. IMPRESSION: Right lower lobe consolidation compatible with pneumonia. Electronically Signed   By: Charlett Nose M.D.   On: 12/04/2020 23:00    Procedures Procedures  (including critical care time)  Medications Ordered in UC Medications  acetaminophen (TYLENOL) tablet 650 mg (650 mg Oral Given 12/04/20 2019)    Initial  Impression / Assessment and Plan / UC Course  I have reviewed the triage vital signs and the nursing notes.  Pertinent labs & imaging results that were available during my care of the patient were reviewed by me and considered in my medical decision making (see chart for details).     1.  Suspected COVID-19 infection: Patient looks acutely ill She currently is tachycardic, tachypneic and febrile in the urgent care.  She is advised to go to the emergency department to be evaluated further.  I suspect that she might need higher level of care.  Patient agrees to going to the emergency department for further evaluation. Final Clinical Impressions(s) / UC Diagnoses   Final diagnoses:  Suspected COVID-19 virus infection  Acute respiratory failure with hypoxia [facility])     Discharge Instructions     You will require higher level of care including further lab work, supplemental oxygen and IV fluids at the minimum.  I recommend you go to the emergency department to be evaluated and managed further.   ED Prescriptions    None     PDMP not reviewed this encounter.   Merrilee Jansky, MD 12/05/20 1319

## 2020-12-06 ENCOUNTER — Telehealth: Payer: Self-pay | Admitting: *Deleted

## 2020-12-06 NOTE — Telephone Encounter (Signed)
  Called to discuss with patient about COVID-19 symptoms and the use of one of the available treatments for those with mild to moderate Covid symptoms and at a high risk of hospitalization.  Pt appears to qualify for outpatient treatment due to co-morbid conditions and/or a member of an at-risk group in accordance with the FDA Emergency Use Authorization.    Vomiting, cough, SOB, body aches, dx with pneumonia  Symptom onset: 11/30/20 Vaccinated: No Booster?  Immunocompromised? No Qualifiers: BMI>25 per 09/03/20  Chart note.    Stephanie Reid

## 2020-12-06 NOTE — Telephone Encounter (Signed)
Called to discuss with patient about COVID-19 symptoms and the use of one of the available treatments for those with mild to moderate Covid symptoms and at a high risk of hospitalization.  Pt appears to qualify for outpatient treatment due to co-morbid conditions and/or a member of an at-risk group in accordance with the FDA Emergency Use Authorization.    Symptom onset: 11/30/20 Vaccinated: no Booster? no Immunocompromised? no Qualifiers: ethnicity, BMI >25  Discussed with patient via phone.  She is greater than 7 days from symptom onset and as such does not qualify for monoclonal antibody therapy at this time per [facility] guidelines.  Tells me she is not tolerating her antibiotic due to nausea.  Reviewed diet for nausea including bananas, toast, rice. She did not pick up Zofran as it was expensive.  We discussed getting coupon via [facility] and she tells me the $32 price point with coupon at [facility] is affordable.  We will send my treatments which are then filled.  Also encouraged to follow-up with post-COVID care clinic.  Alver Sorrow

## 2020-12-09 ENCOUNTER — Emergency Department (HOSPITAL_COMMUNITY): Payer: HRSA Program

## 2020-12-09 ENCOUNTER — Emergency Department (HOSPITAL_COMMUNITY)
Admission: EM | Admit: 2020-12-09 | Discharge: 2020-12-09 | Disposition: A | Discharge status: Home / Self Care | Payer: HRSA Program | Attending: Emergency Medicine | Admitting: Emergency Medicine

## 2020-12-09 ENCOUNTER — Other Ambulatory Visit: Payer: Self-pay

## 2020-12-09 DIAGNOSIS — R112 Nausea with vomiting, unspecified: Secondary | ICD-10-CM | POA: Diagnosis present

## 2020-12-09 DIAGNOSIS — U071 COVID-19: Secondary | ICD-10-CM | POA: Insufficient documentation

## 2020-12-09 DIAGNOSIS — F17210 Nicotine dependence, cigarettes, uncomplicated: Secondary | ICD-10-CM | POA: Insufficient documentation

## 2020-12-09 DIAGNOSIS — J1282 Pneumonia due to coronavirus disease 2019: Secondary | ICD-10-CM | POA: Insufficient documentation

## 2020-12-09 LAB — COMPREHENSIVE METABOLIC PANEL
ALT: 25 U/L (ref 0–44)
AST: 34 U/L (ref 15–41)
Albumin: 3.8 g/dL (ref 3.5–5.0)
Alkaline Phosphatase: 52 U/L (ref 38–126)
Anion gap: 13 (ref 5–15)
BUN: 7 mg/dL (ref 6–20)
CO2: 23 mmol/L (ref 22–32)
Calcium: 8.9 mg/dL (ref 8.9–10.3)
Chloride: 97 mmol/L — ABNORMAL LOW (ref 98–111)
Creatinine, Ser: 0.82 mg/dL (ref 0.44–1.00)
GFR, Estimated: >60 mL/min (ref >60)
Glucose, Bld: 103 mg/dL — ABNORMAL HIGH (ref 70–99)
Potassium: 3.3 mmol/L — ABNORMAL LOW (ref 3.5–5.1)
Sodium: 133 mmol/L — ABNORMAL LOW (ref 135–145)
Total Bilirubin: 0.6 mg/dL (ref 0.3–1.2)
Total Protein: 8.2 g/dL — ABNORMAL HIGH (ref 6.5–8.1)

## 2020-12-09 LAB — CBC
HCT: 37.4 % (ref 36.0–46.0)
Hemoglobin: 13 g/dL (ref 12.0–15.0)
MCH: 29.1 pg (ref 26.0–34.0)
MCHC: 34.8 g/dL (ref 30.0–36.0)
MCV: 83.7 fL (ref 80.0–100.0)
Platelets: 208 10*3/uL (ref 150–400)
RBC: 4.47 MIL/uL (ref 3.87–5.11)
RDW: 11.9 % (ref 11.5–15.5)
WBC: 5.2 10*3/uL (ref 4.0–10.5)
nRBC: 0 % (ref 0.0–0.2)

## 2020-12-09 MED ORDER — ACETAMINOPHEN 500 MG PO TABS
1000.0000 mg | ORAL_TABLET | Freq: Once | ORAL | Status: AC
  Administered 2020-12-09: 1000 mg via ORAL
  Filled 2020-12-09: qty 2

## 2020-12-09 MED ORDER — KETOROLAC TROMETHAMINE 15 MG/ML IJ SOLN
15.0000 mg | Freq: Once | INTRAMUSCULAR | Status: AC
  Administered 2020-12-09: 15 mg via INTRAVENOUS
  Filled 2020-12-09: qty 1

## 2020-12-09 MED ORDER — ONDANSETRON HCL 4 MG/2ML IJ SOLN
4.0000 mg | Freq: Once | INTRAMUSCULAR | Status: AC
  Administered 2020-12-09: 4 mg via INTRAVENOUS
  Filled 2020-12-09: qty 2

## 2020-12-09 MED ORDER — METOCLOPRAMIDE HCL 5 MG/ML IJ SOLN
10.0000 mg | Freq: Once | INTRAMUSCULAR | Status: AC
  Administered 2020-12-09: 10 mg via INTRAVENOUS
  Filled 2020-12-09: qty 2

## 2020-12-09 MED ORDER — PROMETHAZINE HCL 12.5 MG PO TABS: 12.5000 mg | ORAL_TABLET | Freq: Four times a day (QID) | ORAL | 0 refills | Status: DC | PRN

## 2020-12-09 NOTE — Discharge Instructions (Addendum)
Take nausea medicine as needed.  Return to ER for vomiting, difficulty breathing or other new concerning symptoms.  You should continue to remain in isolation until your symptoms are improving and your fevers have stopped.  Please follow-up with primary doctor for recheck this coming week.  If you do not have a primary care doctor I would recommend following up at the [facility].

## 2020-12-09 NOTE — ED Provider Notes (Signed)
[facility]-EMERGENCY DEPT Provider Note   CSN: [id number] Arrival date & time: 12/09/20  9562     History Chief Complaint  Patient presents with  . Covid Positive    Stephanie Reid is a 30 y.o. female.  Came to ER with concern for persistent nausea, vomiting and general fatigue in setting of known COVID-19.  Patient reports she has been having COVID symptoms for the past week.  Came to ER on 1/11.  At the time she was complaining of body aches cough, congestion, nausea vomiting and diarrhea.  COVID positive.  CXR with possible pneumonia.  Patient discharged on course of Augmentin to cover possible bacterial etiology.  Patient states that symptoms have not necessarily worsened but remain persistent.  Her primary concern is the ongoing nausea with occasional episodes of vomiting.  4 episodes of vomiting or dry heaves in the last 24 hours.  Nonbloody nonbilious.  No chest pain, still having cough and chills and fevers.  HPI     Past Medical History:  Diagnosis Date  . Bacterial vaginosis   . Herpes simplex   . Hx of chlamydia infection   . Hx of gonorrhea   . Hx of trichomoniasis   . Seasonal allergies     Patient Active Problem List   Diagnosis Date Noted  . Amenorrhea, secondary 10/06/2013    Past Surgical History:  Procedure Laterality Date  . NO PAST SURGERIES       OB History    Gravida  0   Para  0   Term  0   Preterm  0   AB  0   Living  0     SAB  0   IAB  0   Ectopic  0   Multiple  0   Live Births  0           Family History  Problem Relation Age of Onset  . Hypertension Mother   . Heart disease Mother   . Stroke Mother   . Diabetes Maternal Grandmother     Social History   Tobacco Use  . Smoking status: Current Every Day Smoker    Packs/day: 0.10    Types: Cigarettes  . Smokeless tobacco: Never Used  Vaping Use  . Vaping Use: Never used  Substance Use Topics  . Alcohol use: Yes    Comment: on  weekends  . Drug use: Yes    Types: Marijuana    Home Medications Prior to Admission medications   Medication Sig Start Date End Date Taking? Authorizing Provider  acetaminophen (TYLENOL) 500 MG tablet Take 500 mg by mouth every 6 (six) hours as needed for fever.   Yes [provider]  Ascorbic Acid (VITAMIN C PO) Take 1 tablet by mouth daily.   Yes [provider]  doxycycline (VIBRAMYCIN) 100 MG capsule Take 1 capsule (100 mg total) by mouth 2 (two) times daily. 12/05/20  Yes Upstill, Shari, PA-C  Phenyleph-Doxylamine-DM-APAP (NYQUIL SEVERE COLD/FLU) 5-6.25-10-325 MG/15ML LIQD Take 15 mLs by mouth as needed (congestion/sleep).   Yes [provider]  promethazine (PHENERGAN) 12.5 MG tablet Take 1 tablet (12.5 mg total) by mouth every 6 (six) hours as needed for nausea or vomiting. 12/09/20  Yes Milagros Loll, MD  benzonatate (TESSALON) 100 MG capsule Take 1 capsule (100 mg total) by mouth every 8 (eight) hours. 12/05/20   Elpidio Anis, PA-C  naproxen (NAPROSYN) 375 MG tablet Take 1 tablet (375 mg total) by mouth  2 (two) times daily. Patient not taking: Reported on 12/09/2020 09/03/20   Linwood Dibbles, MD  ondansetron (ZOFRAN ODT) 4 MG disintegrating tablet Take 1 tablet (4 mg total) by mouth every 8 (eight) hours as needed for nausea or vomiting. 12/05/20   Elpidio Anis, PA-C  predniSONE (DELTASONE) 50 MG tablet Take 1 tablet (50 mg total) by mouth daily with breakfast. Patient not taking: No sig reported 06/26/20   Linward Headland V, PA-C    Allergies    Apple, Banana, Orange fruit [citrus], Lactase-lactobacillus, Spinach, Vicodin [hydrocodone-acetaminophen], and Penicillin g  Review of Systems   Review of Systems  Constitutional: Positive for chills, fatigue and fever.  HENT: Negative for ear pain and sore throat.   Eyes: Negative for pain and visual disturbance.  Respiratory: Negative for cough and shortness of breath.   Cardiovascular: Negative for chest pain and  palpitations.  Gastrointestinal: Positive for nausea and vomiting. Negative for abdominal pain.  Genitourinary: Negative for dysuria and hematuria.  Musculoskeletal: Positive for arthralgias and myalgias. Negative for back pain.  Skin: Negative for color change and rash.  Neurological: Negative for seizures and syncope.  All other systems reviewed and are negative.   Physical Exam Updated Vital Signs BP 112/68 (BP Location: Left Arm)   Pulse 89   Temp (!) 100.9 F (38.3 C) (Oral)   Resp 18   LMP 12/04/2009   SpO2 98%   Physical Exam Vitals and nursing note reviewed.  Constitutional:      General: She is not in acute distress.    Appearance: She is well-developed and well-nourished.  HENT:     Head: Normocephalic and atraumatic.  Eyes:     Conjunctiva/sclera: Conjunctivae normal.  Cardiovascular:     Rate and Rhythm: Normal rate and regular rhythm.     Heart sounds: No murmur heard.   Pulmonary:     Effort: Pulmonary effort is normal. No respiratory distress.     Breath sounds: Normal breath sounds.  Abdominal:     Palpations: Abdomen is soft.     Tenderness: There is no abdominal tenderness.  Musculoskeletal:        General: No edema.     Cervical back: Neck supple.  Skin:    General: Skin is warm and dry.  Neurological:     General: No focal deficit present.     Mental Status: She is alert.  Psychiatric:        Mood and Affect: Mood and affect and mood normal.        Behavior: Behavior normal.     ED Results / Procedures / Treatments   Labs (all labs ordered are listed, but only abnormal results are displayed) Labs Reviewed  COMPREHENSIVE METABOLIC PANEL - Abnormal; Notable for the following components:      Result Value   Sodium 133 (*)    Potassium 3.3 (*)    Chloride 97 (*)    Glucose, Bld 103 (*)    Total Protein 8.2 (*)    All other components within normal limits  CBC    EKG None  Radiology DG Chest Portable 1 View  Result Date:  12/09/2020 CLINICAL DATA:  COVID pneumonia, worsening cough, dyspnea, fever EXAM: PORTABLE CHEST 1 VIEW COMPARISON:  12/04/2020 chest radiograph. FINDINGS: Stable cardiomediastinal silhouette with normal heart size. No pneumothorax. No pleural effusion. Faint hazy lower right lung opacity, slightly improved. No new lung opacities. IMPRESSION: Faint hazy lower right lung opacity, slightly improved, compatible with COVID-19 pneumonia. Electronically Signed  By: Delbert Phenix M.D.   On: 12/09/2020 08:51    Procedures Procedures (including critical care time)  Medications Ordered in ED Medications  ondansetron (ZOFRAN) injection 4 mg (4 mg Intravenous Given 12/09/20 0913)  ketorolac (TORADOL) 15 MG/ML injection 15 mg (15 mg Intravenous Given 12/09/20 0913)  acetaminophen (TYLENOL) tablet 1,000 mg (1,000 mg Oral Given 12/09/20 0912)  metoCLOPramide (REGLAN) injection 10 mg (10 mg Intravenous Given 12/09/20 1021)    ED Course  I have reviewed the triage vital signs and the nursing notes.  Pertinent labs & imaging results that were available during my care of the patient were reviewed by me and considered in my medical decision making (see chart for details).    MDM Rules/Calculators/A&P                         30 year old lady presenting to ER with concern for ongoing nausea, vomiting, cough and fever in setting of known COVID-19.  Prior CXR with slight infiltrate, on Augmentin to cover for possible bacterial cause although I have a very high suspicion the etiology is from COVID-19.  Her repeat CXR today is actually improved.  Her basic labs are within normal limits.  Besides the low-grade fever in triage, she is completely normal vital signs, did very well on ambulation trial.  Provided antiemetics, fluids.  At this time believe she is appropriate for continued outpatient management and will discharge home.   After the discussed management above, the patient was determined to be safe for discharge.   The patient was in agreement with this plan and all questions regarding their care were answered.  ED return precautions were discussed and the patient will return to the ED with any significant worsening of condition.  Ariyona PRESSLEY BARSKY was evaluated in Emergency Department on 12/10/2020 for the symptoms described in the history of present illness. She was evaluated in the context of the global COVID-19 pandemic, which necessitated consideration that the patient might be at risk for infection with the SARS-CoV-2 virus that causes COVID-19. Institutional protocols and algorithms that pertain to the evaluation of patients at risk for COVID-19 are in a state of rapid change based on information released by regulatory bodies including the CDC and federal and state organizations. These policies and algorithms were followed during the patient's care in the ED.  Final Clinical Impression(s) / ED Diagnoses Final diagnoses:  COVID-19  Pneumonia due to COVID-19 virus    Rx / DC Orders ED Discharge Orders         Ordered    promethazine (PHENERGAN) 12.5 MG tablet  Every 6 hours PRN        12/09/20 1026           Milagros Loll, MD 12/10/20 [phone]

## 2020-12-09 NOTE — ED Triage Notes (Signed)
Diagnosed with COVID on Monday and was told that she also has pna. Patient presents today with worsening symptoms-- cough, shob, fever, fatigue.

## 2020-12-09 NOTE — ED Notes (Signed)
Pt. Ambulated with no assist. Pt oxygen level was 98% when ambulating in the room. Pt. Denies dizziness. Nurses aware.

## 2021-02-05 ENCOUNTER — Encounter (HOSPITAL_COMMUNITY): Payer: Self-pay | Admitting: Emergency Medicine

## 2021-02-05 ENCOUNTER — Other Ambulatory Visit: Payer: Self-pay

## 2021-02-05 ENCOUNTER — Emergency Department (HOSPITAL_COMMUNITY)
Admission: EM | Admit: 2021-02-05 | Discharge: 2021-02-06 | Disposition: A | Discharge status: Home / Self Care | Payer: Self-pay | Attending: Emergency Medicine | Admitting: Emergency Medicine

## 2021-02-05 DIAGNOSIS — R300 Dysuria: Secondary | ICD-10-CM | POA: Insufficient documentation

## 2021-02-05 DIAGNOSIS — F17210 Nicotine dependence, cigarettes, uncomplicated: Secondary | ICD-10-CM | POA: Insufficient documentation

## 2021-02-05 DIAGNOSIS — N898 Other specified noninflammatory disorders of vagina: Secondary | ICD-10-CM | POA: Insufficient documentation

## 2021-02-05 NOTE — ED Triage Notes (Signed)
Patient reports dysuria "it burns" onset 2 days ago, denies hematuria , no fever or chills .

## 2021-02-06 LAB — URINALYSIS, ROUTINE W REFLEX MICROSCOPIC
Bilirubin Urine: NEGATIVE
Glucose, UA: NEGATIVE mg/dL
Hgb urine dipstick: NEGATIVE
Ketones, ur: NEGATIVE mg/dL
Leukocytes,Ua: NEGATIVE
Nitrite: NEGATIVE
Protein, ur: NEGATIVE mg/dL
Specific Gravity, Urine: 1.021 (ref 1.005–1.030)
pH: 5 (ref 5.0–8.0)

## 2021-02-06 LAB — GC/CHLAMYDIA PROBE AMP (~~LOC~~) NOT AT ARMC
Chlamydia: NEGATIVE
Comment: NEGATIVE
Comment: NORMAL
Neisseria Gonorrhea: NEGATIVE

## 2021-02-06 LAB — PREGNANCY, URINE: Preg Test, Ur: NEGATIVE

## 2021-02-06 LAB — WET PREP, GENITAL
Clue Cells Wet Prep HPF POC: NONE SEEN
Sperm: NONE SEEN
Trich, Wet Prep: NONE SEEN
Yeast Wet Prep HPF POC: NONE SEEN

## 2021-02-06 MED ORDER — VALACYCLOVIR HCL 500 MG PO TABS: 500.0000 mg | ORAL_TABLET | Freq: Two times a day (BID) | ORAL | 0 refills | Status: DC

## 2021-02-06 NOTE — ED Provider Notes (Signed)
[facility] [place] EMERGENCY DEPARTMENT Provider Note   CSN: [id number] Arrival date & time: 02/05/21  2340     History Chief Complaint  Patient presents with  . Dysuria    Stephanie Reid is a 30 y.o. female.  The history is provided by the patient.  Dysuria Pain quality:  Burning Pain severity:  Mild Onset quality:  Gradual Duration:  2 days Timing:  Intermittent Progression:  Worsening Chronicity:  New Relieved by:  Nothing Exacerbated by: urination. Associated symptoms: vaginal discharge   Associated symptoms: no abdominal pain and no fever   Patient presents with dysuria and vaginal discharge.  She reports of the past 2 days she has had burning with urination and small amount of discharge.  No bleeding.  She suspects she is having another herpes outbreak She reports recent unprotected sexual intercourse     Past Medical History:  Diagnosis Date  . Bacterial vaginosis   . Herpes simplex   . Hx of chlamydia infection   . Hx of gonorrhea   . Hx of trichomoniasis   . Seasonal allergies     Patient Active Problem List   Diagnosis Date Noted  . Amenorrhea, secondary 10/06/2013    Past Surgical History:  Procedure Laterality Date  . NO PAST SURGERIES       OB History    Gravida  0   Para  0   Term  0   Preterm  0   AB  0   Living  0     SAB  0   IAB  0   Ectopic  0   Multiple  0   Live Births  0           Family History  Problem Relation Age of Onset  . Hypertension Mother   . Heart disease Mother   . Stroke Mother   . Diabetes Maternal Grandmother     Social History   Tobacco Use  . Smoking status: Current Every Day Smoker    Packs/day: 0.10    Types: Cigarettes  . Smokeless tobacco: Never Used  Vaping Use  . Vaping Use: Never used  Substance Use Topics  . Alcohol use: Yes    Comment: on weekends  . Drug use: Yes    Types: Marijuana    Home Medications Prior to Admission medications   Medication  Sig Start Date End Date Taking? Authorizing Provider  valACYclovir (VALTREX) 500 MG tablet Take 1 tablet (500 mg total) by mouth 2 (two) times daily. 02/06/21  Yes Zadie Rhine, MD  acetaminophen (TYLENOL) 500 MG tablet Take 500 mg by mouth every 6 (six) hours as needed for fever.    [provider]  Ascorbic Acid (VITAMIN C PO) Take 1 tablet by mouth daily.    [provider]  doxycycline (VIBRAMYCIN) 100 MG capsule Take 1 capsule (100 mg total) by mouth 2 (two) times daily. 12/05/20   Elpidio Anis, PA-C  naproxen (NAPROSYN) 375 MG tablet Take 1 tablet (375 mg total) by mouth 2 (two) times daily. Patient not taking: Reported on 12/09/2020 09/03/20   Linwood Dibbles, MD  Phenyleph-Doxylamine-DM-APAP (NYQUIL SEVERE COLD/FLU) 5-6.25-10-325 MG/15ML LIQD Take 15 mLs by mouth as needed (congestion/sleep).    [provider]  promethazine (PHENERGAN) 12.5 MG tablet Take 1 tablet (12.5 mg total) by mouth every 6 (six) hours as needed for nausea or vomiting. 12/09/20 02/06/21  Milagros Loll, MD    Allergies    Apple, Banana,  Orange fruit [citrus], Lactase-lactobacillus, Spinach, Vicodin [hydrocodone-acetaminophen], and Penicillin g  Review of Systems   Review of Systems  Constitutional: Negative for fever.  Gastrointestinal: Negative for abdominal pain.  Genitourinary: Positive for dysuria and vaginal discharge.    Physical Exam Updated Vital Signs BP 115/68   Pulse 68   Temp 98.4 F (36.9 C) (Oral)   Resp 15   Ht 1.651 m (5\' 5" )   Wt 120 kg   LMP  (LMP Unknown)   SpO2 100%   BMI 44.02 kg/m   Physical Exam CONSTITUTIONAL: Well developed/well nourished HEAD: Normocephalic/atraumatic EYES: EOMI ENMT: Mucous membranes moist NECK: supple no meningeal signs CV: S1/S2 noted, no murmurs/rubs/gallops noted LUNGS: Lungs are clear to auscultation bilaterally, no apparent distress ABDOMEN: soft, nontender, no rebound or guarding, bowel sounds noted throughout  abdomen GU:no cva tenderness Pelvic exam chaperoned by nurse with patient consent. No vaginal bleeding or discharge.  Patient had significant pain with use of speculum.  No rash noted. NEURO: Pt is awake/alert/appropriate, moves all extremitiesx4.  No facial droop.   EXTREMITIES:  full ROM SKIN: warm, color normal PSYCH: no abnormalities of mood noted, alert and oriented to situation  ED Results / Procedures / Treatments   Labs (all labs ordered are listed, but only abnormal results are displayed) Labs Reviewed  WET PREP, GENITAL  URINALYSIS, ROUTINE W REFLEX MICROSCOPIC  PREGNANCY, URINE  GC/CHLAMYDIA PROBE AMP () NOT AT [facility]    EKG None  Radiology No results found.  Procedures Procedures   Medications Ordered in ED Medications - No data to display  ED Course  I have reviewed the triage vital signs and the nursing notes.  Pertinent labs results that were available during my care of the patient were reviewed by me and considered in my medical decision making (see chart for details).    MDM Rules/Calculators/A&P                          Patient reports this feels like another outbreak of herpes.  At this time I do not see any significant signs of rash.  However we will place her on Valtrex for 3 days.  Final Clinical Impression(s) / ED Diagnoses Final diagnoses:  Dysuria    Rx / DC Orders ED Discharge Orders         Ordered    valACYclovir (VALTREX) 500 MG tablet  2 times daily        02/06/21 02/08/21           [id number], MD 02/06/21 [phone]

## 2021-02-06 NOTE — ED Notes (Signed)
Pelvic setup completed and at bedside.

## 2021-04-27 ENCOUNTER — Other Ambulatory Visit: Payer: Self-pay

## 2021-04-27 ENCOUNTER — Encounter (HOSPITAL_COMMUNITY): Payer: Self-pay | Admitting: Pharmacy Technician

## 2021-04-27 ENCOUNTER — Emergency Department (HOSPITAL_COMMUNITY)
Admission: EM | Admit: 2021-04-27 | Discharge: 2021-04-27 | Disposition: A | Discharge status: Home / Self Care | Payer: Self-pay | Attending: Emergency Medicine | Admitting: Emergency Medicine

## 2021-04-27 DIAGNOSIS — Z23 Encounter for immunization: Secondary | ICD-10-CM | POA: Insufficient documentation

## 2021-04-27 DIAGNOSIS — X16XXXA Contact with hot heating appliances, radiators and pipes, initial encounter: Secondary | ICD-10-CM | POA: Insufficient documentation

## 2021-04-27 DIAGNOSIS — L03115 Cellulitis of right lower limb: Secondary | ICD-10-CM | POA: Insufficient documentation

## 2021-04-27 DIAGNOSIS — F17210 Nicotine dependence, cigarettes, uncomplicated: Secondary | ICD-10-CM | POA: Insufficient documentation

## 2021-04-27 DIAGNOSIS — T300 Burn of unspecified body region, unspecified degree: Secondary | ICD-10-CM

## 2021-04-27 DIAGNOSIS — T24131A Burn of first degree of right lower leg, initial encounter: Secondary | ICD-10-CM | POA: Insufficient documentation

## 2021-04-27 MED ORDER — CEPHALEXIN 250 MG PO CAPS
500.0000 mg | ORAL_CAPSULE | Freq: Once | ORAL | Status: AC
  Administered 2021-04-27: 500 mg via ORAL
  Filled 2021-04-27: qty 2

## 2021-04-27 MED ORDER — TETANUS-DIPHTH-ACELL PERTUSSIS 5-2.5-18.5 LF-MCG/0.5 IM SUSY
0.5000 mL | PREFILLED_SYRINGE | Freq: Once | INTRAMUSCULAR | Status: AC
  Administered 2021-04-27: 0.5 mL via INTRAMUSCULAR
  Filled 2021-04-27: qty 0.5

## 2021-04-27 MED ORDER — CEPHALEXIN 500 MG PO CAPS: 500.0000 mg | ORAL_CAPSULE | Freq: Four times a day (QID) | ORAL | 0 refills | Status: AC
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
## 2021-07-11 ENCOUNTER — Inpatient Hospital Stay: Admission: RE | Admit: 2021-07-11 | Payer: Self-pay | Source: Ambulatory Visit

## 2021-11-21 ENCOUNTER — Other Ambulatory Visit: Payer: Self-pay

## 2021-11-21 ENCOUNTER — Ambulatory Visit (HOSPITAL_COMMUNITY)
Admission: EM | Admit: 2021-11-21 | Discharge: 2021-11-21 | Disposition: A | Discharge status: Home / Self Care | Payer: Self-pay | Attending: Internal Medicine | Admitting: Internal Medicine

## 2021-11-21 ENCOUNTER — Encounter (HOSPITAL_COMMUNITY): Payer: Self-pay | Admitting: *Deleted

## 2021-11-21 DIAGNOSIS — Z20822 Contact with and (suspected) exposure to covid-19: Secondary | ICD-10-CM | POA: Insufficient documentation

## 2021-11-21 DIAGNOSIS — J069 Acute upper respiratory infection, unspecified: Secondary | ICD-10-CM | POA: Insufficient documentation

## 2021-11-21 LAB — BASIC METABOLIC PANEL
Anion gap: 9 (ref 5–15)
BUN: 6 mg/dL (ref 6–20)
CO2: 27 mmol/L (ref 22–32)
Calcium: 9.8 mg/dL (ref 8.9–10.3)
Chloride: 101 mmol/L (ref 98–111)
Creatinine, Ser: 0.85 mg/dL (ref 0.44–1.00)
GFR, Estimated: >60 mL/min (ref >60)
Glucose, Bld: 121 mg/dL — ABNORMAL HIGH (ref 70–99)
Potassium: 3.4 mmol/L — ABNORMAL LOW (ref 3.5–5.1)
Sodium: 137 mmol/L (ref 135–145)

## 2021-11-21 LAB — CBC WITH DIFFERENTIAL/PLATELET
Abs Immature Granulocytes: 0.01 10*3/uL (ref 0.00–0.07)
Basophils Absolute: 0 10*3/uL (ref 0.0–0.1)
Basophils Relative: 0 %
Eosinophils Absolute: 0.1 10*3/uL (ref 0.0–0.5)
Eosinophils Relative: 1 %
HCT: 43.5 % (ref 36.0–46.0)
Hemoglobin: 14.5 g/dL (ref 12.0–15.0)
Immature Granulocytes: 0 %
Lymphocytes Relative: 31 %
Lymphs Abs: 1.8 10*3/uL (ref 0.7–4.0)
MCH: 29.2 pg (ref 26.0–34.0)
MCHC: 33.3 g/dL (ref 30.0–36.0)
MCV: 87.5 fL (ref 80.0–100.0)
Monocytes Absolute: 0.6 10*3/uL (ref 0.1–1.0)
Monocytes Relative: 11 %
Neutro Abs: 3.2 10*3/uL (ref 1.7–7.7)
Neutrophils Relative %: 57 %
Platelets: 263 10*3/uL (ref 150–400)
RBC: 4.97 MIL/uL (ref 3.87–5.11)
RDW: 12.5 % (ref 11.5–15.5)
WBC: 5.7 10*3/uL (ref 4.0–10.5)
nRBC: 0 % (ref 0.0–0.2)

## 2021-11-21 MED ORDER — ONDANSETRON 4 MG PO TBDP: 4.0000 mg | ORAL_TABLET | Freq: Three times a day (TID) | ORAL | 0 refills | Status: DC | PRN

## 2021-11-21 MED ORDER — BENZONATATE 100 MG PO CAPS: 100.0000 mg | ORAL_CAPSULE | Freq: Three times a day (TID) | ORAL | 0 refills | Status: DC | PRN

## 2021-11-21 NOTE — ED Provider Notes (Signed)
[facility]    CSN: [id number] Arrival date & time: 11/21/21  1306      History   Chief Complaint Chief Complaint  Patient presents with   Headache   Sore Throat   Cough   Nausea    HPI Stephanie Reid is a 30 y.o. female comes to the urgent care with a 3-day history of nausea, headache, sore throat and a nonproductive cough.  Patient's symptoms have been persistent.  She denies any fever or chills.  She endorses increasing fatigue.  She has had nausea but no vomiting.  No diarrhea.  Patient has generalized body aches.  No chest pain or chest pressure.  No sick contacts.  No tobacco use. HPI  Past Medical History:  Diagnosis Date   Bacterial vaginosis    Herpes simplex    Hx of chlamydia infection    Hx of gonorrhea    Hx of trichomoniasis    Seasonal allergies     Patient Active Problem List   Diagnosis Date Noted   Amenorrhea, secondary 10/06/2013    Past Surgical History:  Procedure Laterality Date   NO PAST SURGERIES      OB History     Gravida  0   Para  0   Term  0   Preterm  0   AB  0   Living  0      SAB  0   IAB  0   Ectopic  0   Multiple  0   Live Births  0            Home Medications    Prior to Admission medications   Medication Sig Start Date End Date Taking? Authorizing Provider  benzonatate (TESSALON) 100 MG capsule Take 1 capsule (100 mg total) by mouth 3 (three) times daily as needed for cough. 11/21/21  Yes Kathreen Dileo, Britta Mccreedy, MD  ondansetron (ZOFRAN-ODT) 4 MG disintegrating tablet Take 1 tablet (4 mg total) by mouth every 8 (eight) hours as needed for nausea or vomiting. 11/21/21  Yes Munirah Doerner, Britta Mccreedy, MD  acetaminophen (TYLENOL) 500 MG tablet Take 500 mg by mouth every 6 (six) hours as needed for fever.    [provider]  Ascorbic Acid (VITAMIN C PO) Take 1 tablet by mouth daily.    [provider]  doxycycline (VIBRAMYCIN) 100 MG capsule Take 1 capsule (100 mg total) by mouth 2  (two) times daily. 12/05/20   Elpidio Anis, PA-C  Phenyleph-Doxylamine-DM-APAP (NYQUIL SEVERE COLD/FLU) 5-6.25-10-325 MG/15ML LIQD Take 15 mLs by mouth as needed (congestion/sleep).    [provider]  valACYclovir (VALTREX) 500 MG tablet Take 1 tablet (500 mg total) by mouth 2 (two) times daily. 02/06/21   Zadie Rhine, MD  promethazine (PHENERGAN) 12.5 MG tablet Take 1 tablet (12.5 mg total) by mouth every 6 (six) hours as needed for nausea or vomiting. 12/09/20 02/06/21  Milagros Loll, MD    Family History Family History  Problem Relation Age of Onset   Hypertension Mother    Heart disease Mother    Stroke Mother    Diabetes Maternal Grandmother     Social History Social History   Tobacco Use   Smoking status: Every Day    Packs/day: 0.10    Types: Cigarettes   Smokeless tobacco: Never  Vaping Use   Vaping Use: Never used  Substance Use Topics   Alcohol use: Yes    Comment: on weekends   Drug use: Yes  Types: Marijuana     Allergies   Apple, Banana, Orange fruit [citrus], Lactase-lactobacillus, Spinach, Vicodin [hydrocodone-acetaminophen], and Penicillin g   Review of Systems Review of Systems  Constitutional: Negative.  Negative for chills and fever.  HENT:  Positive for congestion.   Respiratory:  Positive for cough. Negative for chest tightness, shortness of breath and wheezing.   Gastrointestinal:  Positive for nausea.  Musculoskeletal:  Positive for myalgias. Negative for arthralgias.  Neurological:  Positive for headaches. Negative for dizziness and light-headedness.    Physical Exam Triage Vital Signs ED Triage Vitals  Enc Vitals Group     BP 11/21/21 1705 130/86     Pulse Rate 11/21/21 1705 90     Resp 11/21/21 1705 20     Temp 11/21/21 1705 99.2 F (37.3 C)     Temp src --      SpO2 11/21/21 1705 98 %     Weight --      Height --      Head Circumference --      Peak Flow --      Pain Score 11/21/21 1702 7     Pain Loc --       Pain Edu? --      Excl. in GC? --    No data found.  Updated Vital Signs BP 130/86    Pulse 90    Temp 99.2 F (37.3 C)    Resp 20    LMP  (LMP Unknown)    SpO2 98%   Visual Acuity Right Eye Distance:   Left Eye Distance:   Bilateral Distance:    Right Eye Near:   Left Eye Near:    Bilateral Near:     Physical Exam Vitals and nursing note reviewed.  Constitutional:      General: She is not in acute distress.    Appearance: She is not ill-appearing.  Cardiovascular:     Rate and Rhythm: Normal rate and regular rhythm.  Pulmonary:     Effort: Pulmonary effort is normal.     Breath sounds: Normal breath sounds.  Abdominal:     General: Bowel sounds are normal.     Palpations: Abdomen is soft.  Skin:    General: Skin is warm.  Neurological:     Mental Status: She is alert.     GCS: GCS eye subscore is 4. GCS verbal subscore is 5. GCS motor subscore is 6.     UC Treatments / Results  Labs (all labs ordered are listed, but only abnormal results are displayed) Labs Reviewed  SARS CORONAVIRUS 2 (TAT 6-24 HRS)  CBC WITH DIFFERENTIAL/PLATELET  BASIC METABOLIC PANEL    EKG   Radiology No results found.  Procedures Procedures (including critical care time)  Medications Ordered in UC Medications - No data to display  Initial Impression / Assessment and Plan / UC Course  I have reviewed the triage vital signs and the nursing notes.  Pertinent labs & imaging results that were available during my care of the patient were reviewed by me and considered in my medical decision making (see chart for details).     1.  Viral URI with cough: Increase oral fluid intake COVID-19 PCR test has been sent CBC, BMP We will call you with recommendations if labs are abnormal Tessalon Perles as needed for cough Zofran as needed for nausea/vomiting.  Final Clinical Impressions(s) / UC Diagnoses   Final diagnoses:  Viral URI with cough  Discharge Instructions       Increase oral fluid intake We will call you with recommendations if labs are abnormal Take Tylenol/Motrin as needed for generalized body aches Take medications as prescribed    ED Prescriptions     Medication Sig Dispense Auth. Provider   benzonatate (TESSALON) 100 MG capsule Take 1 capsule (100 mg total) by mouth 3 (three) times daily as needed for cough. 21 capsule Branden Shallenberger, Britta Mccreedy, MD   ondansetron (ZOFRAN-ODT) 4 MG disintegrating tablet Take 1 tablet (4 mg total) by mouth every 8 (eight) hours as needed for nausea or vomiting. 20 tablet Koltyn Kelsay, Britta Mccreedy, MD      PDMP not reviewed this encounter.   Merrilee Jansky, MD 11/21/21 [phone]

## 2021-11-21 NOTE — ED Triage Notes (Signed)
Pt reports nausea ,HA ,sore throat and cough started 3 days ago.

## 2021-11-21 NOTE — Discharge Instructions (Addendum)
Increase oral fluid intake We will call you with recommendations if labs are abnormal Take Tylenol/Motrin as needed for generalized body aches Take medications as prescribed

## 2021-11-22 LAB — SARS CORONAVIRUS 2 (TAT 6-24 HRS): SARS Coronavirus 2: NEGATIVE

## 2022-03-29 ENCOUNTER — Other Ambulatory Visit: Payer: Self-pay

## 2022-03-29 ENCOUNTER — Emergency Department (HOSPITAL_COMMUNITY)
Admission: EM | Admit: 2022-03-29 | Discharge: 2022-03-29 | Disposition: A | Discharge status: Home / Self Care | Payer: Self-pay | Attending: Emergency Medicine | Admitting: Emergency Medicine

## 2022-03-29 ENCOUNTER — Encounter (HOSPITAL_COMMUNITY): Payer: Self-pay

## 2022-03-29 DIAGNOSIS — K011 Impacted teeth: Secondary | ICD-10-CM | POA: Insufficient documentation

## 2022-03-29 DIAGNOSIS — M791 Myalgia, unspecified site: Secondary | ICD-10-CM | POA: Insufficient documentation

## 2022-03-29 DIAGNOSIS — R52 Pain, unspecified: Secondary | ICD-10-CM | POA: Insufficient documentation

## 2022-03-29 MED ORDER — NAPROXEN 500 MG PO TABS
500.0000 mg | ORAL_TABLET | Freq: Once | ORAL | Status: AC
  Administered 2022-03-29: 500 mg via ORAL
  Filled 2022-03-29: qty 1

## 2022-03-29 MED ORDER — DICLOFENAC SODIUM 50 MG PO TBEC: 50.0000 mg | DELAYED_RELEASE_TABLET | Freq: Two times a day (BID) | ORAL | 0 refills | Status: AC

## 2022-03-29 NOTE — Discharge Instructions (Addendum)
Take diclofenac as directed.  Follow-up with your primary care provider if your body aches persist.  Follow-up with oral surgery for evaluation of your impacted tooth. ?

## 2022-03-29 NOTE — ED Triage Notes (Signed)
Patient said she is having left lower wisdom tooth pain. Also complaining of tingling in her feet. ?

## 2022-03-29 NOTE — ED Provider Notes (Signed)
?[facility]-EMERGENCY DEPT ?Provider Note ? ? ?CSN: [id number] ?Arrival date & time: 03/29/22  0242 ? ?  ? ?History ? ?Chief Complaint  ?Patient presents with  ? Dental Pain  ? Tingling  ? ? ?Stephanie Reid is a 31 y.o. female. ? ?32 year old female with complaint of left lower wisdom tooth pain, states that she is seen a dentist in the past visit and was told she would need to see oral surgery to have the tooth extracted.  Reports pain worsening over the past week, denies fever, injury.  Also reports body aches after visiting [place] 1 week ago, states that she did not ride any of the rides, not taking anything for her pain currently.  No other complaints or concerns today. ? ? ?  ? ?Home Medications ?Prior to Admission medications   ?Medication Sig Start Date End Date Taking? Authorizing Provider  ?diclofenac (VOLTAREN) 50 MG EC tablet Take 1 tablet (50 mg total) by mouth 2 (two) times daily for 10 days. 03/29/22 04/08/22 Yes Jeannie Fend, PA-C  ?acetaminophen (TYLENOL) 500 MG tablet Take 500 mg by mouth every 6 (six) hours as needed for fever.    [provider]  ?Ascorbic Acid (VITAMIN C PO) Take 1 tablet by mouth daily.    [provider]  ?benzonatate (TESSALON) 100 MG capsule Take 1 capsule (100 mg total) by mouth 3 (three) times daily as needed for cough. 11/21/21   LampteyBritta Mccreedy, MD  ?ondansetron (ZOFRAN-ODT) 4 MG disintegrating tablet Take 1 tablet (4 mg total) by mouth every 8 (eight) hours as needed for nausea or vomiting. 11/21/21   Lamptey, Britta Mccreedy, MD  ?valACYclovir (VALTREX) 500 MG tablet Take 1 tablet (500 mg total) by mouth 2 (two) times daily. 02/06/21   Zadie Rhine, MD  ?promethazine (PHENERGAN) 12.5 MG tablet Take 1 tablet (12.5 mg total) by mouth every 6 (six) hours as needed for nausea or vomiting. 12/09/20 02/06/21  Milagros Loll, MD  ?   ? ?Allergies    ?Apple juice, Banana, Orange fruit [citrus], Lactase-lactobacillus, Spinach, Vicodin  [hydrocodone-acetaminophen], and Penicillin g   ? ?Review of Systems   ?Review of Systems ?Negative except as per HPI ?Physical Exam ?Updated Vital Signs ?BP 118/71 (BP Location: Right Arm)   Pulse 81   Temp 98.5 ?F (36.9 ?C) (Oral)   Resp 16   Ht 5\' 5"  (1.651 m)   Wt 104.3 kg   LMP  (LMP Unknown)   SpO2 98%   BMI 38.27 kg/m?  ?Physical Exam ?Vitals and nursing note reviewed.  ?Constitutional:   ?   General: She is not in acute distress. ?   Appearance: She is well-developed. She is not diaphoretic.  ?HENT:  ?   Head: Normocephalic and atraumatic.  ?   Jaw: No trismus.  ?   Nose: Nose normal.  ?   Mouth/Throat:  ?   Mouth: Mucous membranes are moist.  ?   Comments: Left lower wisdom tooth appears to be emerging sideways without obvious abscess, no active drainage. ?Eyes:  ?   Conjunctiva/sclera: Conjunctivae normal.  ?Cardiovascular:  ?   Pulses: Normal pulses.  ?Pulmonary:  ?   Effort: Pulmonary effort is normal.  ?Musculoskeletal:     ?   General: Tenderness present. No swelling, deformity or signs of injury. Normal range of motion.  ?   Cervical back: Neck supple.  ?   Right lower leg: No edema.  ?   Left lower leg:  No edema.  ?Lymphadenopathy:  ?   Cervical: No cervical adenopathy.  ?Skin: ?   General: Skin is warm and dry.  ?   Findings: No erythema or rash.  ?Neurological:  ?   Mental Status: She is alert and oriented to person, place, and time.  ?   Sensory: No sensory deficit.  ?   Motor: No weakness.  ?Psychiatric:     ?   Behavior: Behavior normal.  ? ? ?ED Results / Procedures / Treatments   ?Labs ?(all labs ordered are listed, but only abnormal results are displayed) ?Labs Reviewed - No data to display ? ?EKG ?None ? ?Radiology ?No results found. ? ?Procedures ?Procedures  ? ? ?Medications Ordered in ED ?Medications - No data to display ? ?ED Course/ Medical Decision Making/ A&P ?  ?                        ?Medical Decision Making ?Risk ?Prescription drug management. ? ? ?31 year old female with  complaint of dental pain, appears to have impacted wisdom tooth, will treat with NSAIDs and referred to oral surgery.  Regarding her generalized body aches, she has nonspecific musculoskeletal tenderness without joint swelling or erythema.  Sensation intact with strong pulses present peripherally.  Patient to take same NSAID prescribed for her dental pain, follow-up with PCP if discomfort persists. ? ? ? ? ? ? ? ?Final Clinical Impression(s) / ED Diagnoses ?Final diagnoses:  ?Impacted tooth  ?Myalgia  ? ? ?Rx / DC Orders ?ED Discharge Orders   ? ?      Ordered  ?  diclofenac (VOLTAREN) 50 MG EC tablet  2 times daily       ? 03/29/22 0319  ? ?  ?  ? ?  ? ? ?  ?Jeannie Fend, PA-C ?03/29/22 0322 ? ?  ?Tilden Fossa, MD ?03/29/22 [phone] ? ?

## 2022-03-29 NOTE — ED Notes (Signed)
Patient provided with ice pack per request.

## 2022-05-25 ENCOUNTER — Emergency Department (HOSPITAL_COMMUNITY): Admit: 2022-05-25 | Discharge: 2022-05-26 | Disposition: A | Discharge status: Still In Hospital | Payer: Self-pay

## 2022-05-25 ENCOUNTER — Other Ambulatory Visit: Payer: Self-pay

## 2022-05-25 ENCOUNTER — Inpatient Hospital Stay (HOSPITAL_COMMUNITY)
Admission: AD | Admit: 2022-05-25 | Discharge: 2022-05-26 | Disposition: A | Discharge status: Home / Self Care | Payer: Self-pay | Attending: Obstetrics & Gynecology | Admitting: Obstetrics & Gynecology

## 2022-05-25 DIAGNOSIS — R350 Frequency of micturition: Secondary | ICD-10-CM | POA: Insufficient documentation

## 2022-05-25 DIAGNOSIS — Z3202 Encounter for pregnancy test, result negative: Secondary | ICD-10-CM | POA: Insufficient documentation

## 2022-05-25 DIAGNOSIS — Z793 Long term (current) use of hormonal contraceptives: Secondary | ICD-10-CM | POA: Insufficient documentation

## 2022-05-25 LAB — ABO/RH: ABO/RH(D): A POS

## 2022-05-25 LAB — POCT PREGNANCY, URINE: Preg Test, Ur: NEGATIVE

## 2022-05-25 NOTE — MAU Note (Signed)
..  Stephanie Reid is a 31 y.o. at Unknown here in MAU reporting: took a pregnancy test today and it was positive. Denies vaginal bleeding or abdominal pain. Has herpes and feels like she might have an outbreak because she has been itchy.  Reports nausea and has been more tired than normal.  LMP: Has not had a period in 12 years. Was on Depo and never got a period.   Pain score: 0/10 Vitals:   05/25/22 2229  BP: 123/76  Pulse: 91  Resp: 16  Temp: 98.5 F (36.9 C)  SpO2: 99%      Lab orders placed from triage: Pregnancy test

## 2022-05-26 DIAGNOSIS — Z3202 Encounter for pregnancy test, result negative: Secondary | ICD-10-CM

## 2022-05-26 LAB — HCG, QUANTITATIVE, PREGNANCY: hCG, Beta Chain, Quant, S: 1 m[IU]/mL (ref <5)

## 2022-05-26 NOTE — ED Notes (Signed)
Pt called by triage nurse and tech no answer

## 2022-05-26 NOTE — MAU Provider Note (Signed)
Event Date/Time   First Provider Initiated Contact with Patient 05/25/22 2343      S Stephanie Reid is a 31 y.o. G0P0000 patient who presents to MAU today with complaint of positive UPT and vaginal discomfort.  She reports she took a PT because of urinary frequency, but has not a menstrual cycle since discontinuing depo provera 10-12 years ago.    O BP 123/76 (BP Location: Right Arm)   Pulse 91   Temp 98.5 F (36.9 C) (Oral)   Resp 16   Ht 5\' 5"  (1.651 m)   Wt 96.5 kg   LMP 12/04/2009   SpO2 99%   BMI 35.41 kg/m  Physical Exam Vitals reviewed.  Constitutional:      Appearance: Normal appearance.  HENT:     Head: Normocephalic and atraumatic.  Eyes:     Conjunctiva/sclera: Conjunctivae normal.  Musculoskeletal:        General: Normal range of motion.     Cervical back: Normal range of motion.  Neurological:     Mental Status: She is alert and oriented to person, place, and time.  Psychiatric:        Mood and Affect: Mood normal.        Behavior: Behavior normal.     A Medical screening exam complete Negative UPT  P Reviewed UPT findings. Discussed additional labs today. Given option to wait or receive phone call if needing to return. Patient opts for discharge. Will send mychart message if result is c/w negative UPT. Patient given the option of transfer to [facility] for further evaluation or seek care in outpatient facility of choice. States she will consider going to [facility] tomorrow.   [facility], CNM 05/26/2022 12:09 AM

## 2022-05-28 ENCOUNTER — Emergency Department (HOSPITAL_COMMUNITY): Admission: EM | Admit: 2022-05-28 | Discharge: 2022-05-28 | Discharge status: Against Medical Advice | Payer: Self-pay

## 2022-05-31 ENCOUNTER — Emergency Department (HOSPITAL_COMMUNITY)
Admission: EM | Admit: 2022-05-31 | Discharge: 2022-05-31 | Disposition: A | Discharge status: Home / Self Care | Payer: Self-pay | Attending: Emergency Medicine | Admitting: Emergency Medicine

## 2022-05-31 ENCOUNTER — Encounter (HOSPITAL_COMMUNITY): Payer: Self-pay

## 2022-05-31 ENCOUNTER — Other Ambulatory Visit: Payer: Self-pay

## 2022-05-31 DIAGNOSIS — Z202 Contact with and (suspected) exposure to infections with a predominantly sexual mode of transmission: Secondary | ICD-10-CM | POA: Insufficient documentation

## 2022-05-31 DIAGNOSIS — A64 Unspecified sexually transmitted disease: Secondary | ICD-10-CM

## 2022-05-31 DIAGNOSIS — A599 Trichomoniasis, unspecified: Secondary | ICD-10-CM

## 2022-05-31 LAB — URINALYSIS, ROUTINE W REFLEX MICROSCOPIC
Bacteria, UA: NONE SEEN
Bilirubin Urine: NEGATIVE
Glucose, UA: >=500 mg/dL — AB
Hgb urine dipstick: NEGATIVE
Ketones, ur: 5 mg/dL — AB
Nitrite: NEGATIVE
Protein, ur: NEGATIVE mg/dL
Specific Gravity, Urine: 1.041 — ABNORMAL HIGH (ref 1.005–1.030)
pH: 5 (ref 5.0–8.0)

## 2022-05-31 LAB — WET PREP, GENITAL
Clue Cells Wet Prep HPF POC: NONE SEEN
Sperm: NONE SEEN
WBC, Wet Prep HPF POC: >=10 — AB (ref <10)
Yeast Wet Prep HPF POC: NONE SEEN

## 2022-05-31 LAB — GC/CHLAMYDIA PROBE AMP (~~LOC~~) NOT AT ARMC
Chlamydia: NEGATIVE
Comment: NEGATIVE
Comment: NORMAL
Neisseria Gonorrhea: NEGATIVE

## 2022-05-31 LAB — PREGNANCY, URINE: Preg Test, Ur: NEGATIVE

## 2022-05-31 LAB — I-STAT BETA HCG BLOOD, ED (MC, WL, AP ONLY): I-stat hCG, quantitative: <5 m[IU]/mL (ref <5)

## 2022-05-31 MED ORDER — DEXTROSE 5 % IV SOLN: 500.0000 mg | Freq: Once | INTRAVENOUS | Status: DC

## 2022-05-31 MED ORDER — DOXYCYCLINE HYCLATE 100 MG PO TABS
100.0000 mg | ORAL_TABLET | Freq: Once | ORAL | Status: AC
  Administered 2022-05-31: 100 mg via ORAL
  Filled 2022-05-31: qty 1

## 2022-05-31 MED ORDER — DOXYCYCLINE HYCLATE 100 MG PO CAPS: 100.0000 mg | ORAL_CAPSULE | Freq: Two times a day (BID) | ORAL | 0 refills | Status: DC

## 2022-05-31 MED ORDER — STERILE WATER FOR INJECTION IJ SOLN
INTRAMUSCULAR | Status: AC
  Filled 2022-05-31: qty 10

## 2022-05-31 MED ORDER — CEFTRIAXONE SODIUM 1 G IJ SOLR
500.0000 mg | Freq: Once | INTRAMUSCULAR | Status: DC
  Filled 2022-05-31: qty 10

## 2022-05-31 MED ORDER — CEFTRIAXONE SODIUM 1 G IJ SOLR
500.0000 mg | Freq: Once | INTRAMUSCULAR | Status: AC
  Administered 2022-05-31: 500 mg via INTRAMUSCULAR
  Filled 2022-05-31: qty 10

## 2022-05-31 MED ORDER — METRONIDAZOLE 500 MG PO TABS: 500.0000 mg | ORAL_TABLET | Freq: Two times a day (BID) | ORAL | 0 refills | Status: DC

## 2022-05-31 NOTE — ED Triage Notes (Signed)
Vaginal discharge and urinary frequency x 1 week after swimming. Pt also complaining of sore throat.  Denies unprotected sex.  Denies odor.
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
## 2022-05-31 NOTE — Discharge Instructions (Signed)
Your STI panel has been sent.  It may take 2 to 3 days for your gonorrhea and chlamydia to result.  I recommend you sign up for MyChart so you have access to the results.  Otherwise if these are positive you will get a call.  You elected to proceed with treatment today.  Your wet prep was also positive for trichomonas.  I have sent Flagyl into the pharmacy for you.  If you have worsening symptoms return for evaluation.  Please notify your sexual partners of the past 6 months to also get tested.

## 2022-05-31 NOTE — ED Notes (Signed)
Pt states understanding of dc instructions, importance of follow up, and prescriptions. Pt denies questions or concerns upon dc. Pt declined wheelchair assistance upon dc. Pt ambulated out of ed w/ steady gait. No belongings left in room upon dc.  

## 2022-06-09 ENCOUNTER — Ambulatory Visit
Admission: EM | Admit: 2022-06-09 | Discharge: 2022-06-09 | Disposition: A | Discharge status: Home / Self Care | Payer: Self-pay | Attending: Physician Assistant | Admitting: Physician Assistant

## 2022-06-09 DIAGNOSIS — B370 Candidal stomatitis: Secondary | ICD-10-CM

## 2022-06-09 DIAGNOSIS — K146 Glossodynia: Secondary | ICD-10-CM

## 2022-06-09 DIAGNOSIS — N898 Other specified noninflammatory disorders of vagina: Secondary | ICD-10-CM

## 2022-06-09 MED ORDER — FLUCONAZOLE 150 MG PO TABS: 150.0000 mg | ORAL_TABLET | ORAL | 0 refills | Status: DC | PRN

## 2022-06-09 MED ORDER — NYSTATIN 100000 UNIT/ML MT SUSP: 500000.0000 [IU] | Freq: Four times a day (QID) | OROMUCOSAL | 0 refills | Status: DC

## 2022-06-09 MED ORDER — VALACYCLOVIR HCL 1 G PO TABS: 1000.0000 mg | ORAL_TABLET | Freq: Two times a day (BID) | ORAL | 0 refills | Status: DC

## 2022-06-09 NOTE — Discharge Instructions (Signed)
We will contact you if your swab indicates we need to treat for anything else.  Use Diflucan to treat for both oral and vaginal yeast.  Use nystatin for oral lesion.  I have also called in a refill of Valtrex.  If her symptoms or not improving with this medication regimen I do recommend a follow-up with OB/GYN.  Please call to schedule an appointment.  If you have any worsening symptoms you need to be seen immediately.

## 2022-06-09 NOTE — ED Triage Notes (Signed)
Pt here for herpes outbreak, sxs started "last week." States took antiviral without resolution.

## 2022-06-09 NOTE — ED Provider Notes (Signed)
[facility]    CSN: [id number] Arrival date & time: 06/09/22  0844      History   Chief Complaint Chief Complaint  Patient presents with   sti     HPI Stephanie Reid is a 31 y.o. female.   Patient presents today with a weeklong history of vaginal irritation.  She has been seen in the emergency room twice and was started on metronidazole and doxycycline which she has since completed.  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  She reports a burning sensation as well as vaginal discharge.  She reports dysuria but states this is not related to the passage of urine but more urine touching her skin.  She did try Monistat but this did not provide any relief of symptoms.  Denies history of diabetes and does not take SGLT2 inhibitor.  In addition she has developed swelling and discomfort in her mouth and tongue.  She reports that she will have some white patches but these are removable.  She has recently been treated with antibiotics.  Reports she is able to drink.  She does have a history of allergies but denies any new exposures or exposure to known allergens.  Denies any changes to medication or diet.    Past Medical History:  Diagnosis Date   Bacterial vaginosis    Herpes simplex    Hx of chlamydia infection    Hx of gonorrhea    Hx of trichomoniasis    Seasonal allergies     Patient Active Problem List   Diagnosis Date Noted   Amenorrhea, secondary 10/06/2013    Past Surgical History:  Procedure Laterality Date   NO PAST SURGERIES      OB History     Gravida  0   Para  0   Term  0   Preterm  0   AB  0   Living  0      SAB  0   IAB  0   Ectopic  0   Multiple  0   Live Births  0            Home Medications    Prior to Admission medications   Medication Sig Start Date End Date Taking? Authorizing Provider  fluconazole  (DIFLUCAN) 150 MG tablet Take 1 tablet (150 mg total) by mouth every 3 (three) days as needed. 06/09/22  Yes Stephanie Bergin K, PA-C  nystatin (MYCOSTATIN) 100000 UNIT/ML suspension Take 5 mLs (500,000 Units total) by mouth 4 (four) times daily. 06/09/22  Yes Stephanie Reid, Stephanie Retort, PA-C  valACYclovir (VALTREX) 1000 MG tablet Take 1 tablet (1,000 mg total) by mouth 2 (two) times daily. 06/09/22  Yes Stephanie Reid, Stephanie Retort, PA-C  acetaminophen (TYLENOL) 500 MG tablet Take 500 mg by mouth every 6 (six) hours as needed for fever.    [provider]  Ascorbic Acid (VITAMIN C PO) Take 1 tablet by mouth daily.    [provider]  promethazine (PHENERGAN) 12.5 MG tablet Take 1 tablet (12.5 mg total) by mouth every 6 (six) hours as needed for nausea or vomiting. 12/09/20 02/06/21  Milagros Loll, MD    Family History Family History  Problem Relation Age of Onset   Hypertension Mother    Heart disease Mother    Stroke Mother    Diabetes Maternal Grandmother     Social History Social History   Tobacco  Use   Smoking status: Every Day    Packs/day: 0.10    Types: Cigarettes   Smokeless tobacco: Never  Vaping Use   Vaping Use: Never used  Substance Use Topics   Alcohol use: Yes    Comment: on weekends   Drug use: Yes    Types: Marijuana     Allergies   Apple juice, Banana, Orange fruit [citrus], Lactase-lactobacillus, Spinach, Vicodin [hydrocodone-acetaminophen], and Penicillin g   Review of Systems Review of Systems  Constitutional:  Positive for activity change. Negative for appetite change, fatigue and fever.  HENT:  Positive for sore throat. Negative for trouble swallowing and voice change.   Respiratory:  Negative for cough and shortness of breath.   Cardiovascular:  Negative for chest pain.  Gastrointestinal:  Negative for abdominal pain, diarrhea, nausea and vomiting.  Genitourinary:  Positive for vaginal discharge and vaginal pain. Negative for dysuria, frequency, genital  sores, pelvic pain, urgency and vaginal bleeding.     Physical Exam Triage Vital Signs ED Triage Vitals [06/09/22 0901]  Enc Vitals Group     BP 120/80     Pulse Rate 68     Resp 18     Temp 98 F (36.7 C)     Temp Source Oral     SpO2 98 %     Weight      Height      Head Circumference      Peak Flow      Pain Score 0     Pain Loc      Pain Edu?      Excl. in GC?    No data found.  Updated Vital Signs BP 120/80 (BP Location: Left Arm)   Pulse 68   Temp 98 F (36.7 C) (Oral)   Resp 18   LMP 12/04/2009   SpO2 98%   Visual Acuity Right Eye Distance:   Left Eye Distance:   Bilateral Distance:    Right Eye Near:   Left Eye Near:    Bilateral Near:     Physical Exam Vitals reviewed.  Constitutional:      General: She is awake. She is not in acute distress.    Appearance: Normal appearance. She is well-developed. She is not ill-appearing.     Comments: Very pleasant female appears stated age in no acute distress sitting comfortably in exam room  HENT:     Head: Normocephalic and atraumatic.     Mouth/Throat:     Pharynx: Uvula midline. No oropharyngeal exudate or posterior oropharyngeal erythema.     Comments: White patches noted posterior tongue and buccal mucosa removable with tongue depressor. Cardiovascular:     Rate and Rhythm: Normal rate and regular rhythm.     Heart sounds: Normal heart sounds, S1 normal and S2 normal. No murmur heard. Pulmonary:     Effort: Pulmonary effort is normal.     Breath sounds: Normal breath sounds. No wheezing, rhonchi or rales.     Comments: Clear to auscultation bilaterally Abdominal:     Palpations: Abdomen is soft.     Tenderness: There is no abdominal tenderness. There is no right CVA tenderness, left CVA tenderness, guarding or rebound.  Genitourinary:    Labia:        Right: Rash present. No tenderness or lesion.        Left: Rash present. No tenderness or lesion.      Comments: Rash with white discharge noted  labia minora and at introitus.  No active ulcerated or vesicular lesions.  STI swab collected via blind swab. Psychiatric:        Behavior: Behavior is cooperative.      UC Treatments / Results  Labs (all labs ordered are listed, but only abnormal results are displayed) Labs Reviewed  CERVICOVAGINAL ANCILLARY ONLY    EKG   Radiology No results found.  Procedures Procedures (including critical care time)  Medications Ordered in UC Medications - No data to display  Initial Impression / Assessment and Plan / UC Course  I have reviewed the triage vital signs and the nursing notes.  Pertinent labs & imaging results that were available during my care of the patient were reviewed by me and considered in my medical decision making (see chart for details).    Sore throat and tongue pain likely related to yeast from recent antibiotic use.  Will treat with Diflucan and nystatin.  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  Patient does have some signs of yeast infection.  STI swab was collected today but will treat with Diflucan to cover for both oral thrush and vaginal yeast infection.  Patient did request a refill of Valtrex to have on hand and this was sent to the pharmacy per her request.  Discussed that if she continues to have symptoms she should follow-up with an OB/GYN.  She was given contact information for a local provider.  Recommended that she rest and drink plenty of fluid.  Discussed that if she has any worsening symptoms she needs to be seen immediately.  Strict return precautions given.  Final Clinical Impressions(s) / UC Diagnoses   Final diagnoses:  Thrush  Tongue pain  Vaginal irritation     Discharge Instructions      We will contact you if your swab indicates we need to treat for anything else.  Use Diflucan to treat for both oral and vaginal yeast.  Use nystatin for oral lesion.  I have also called in a refill of Valtrex.  If her symptoms or not  improving with this medication regimen I do recommend a follow-up with OB/GYN.  Please call to schedule an appointment.  If you have any worsening symptoms you need to be seen immediately.     ED Prescriptions     Medication Sig Dispense Auth. Provider   fluconazole (DIFLUCAN) 150 MG tablet Take 1 tablet (150 mg total) by mouth every 3 (three) days as needed. 2 tablet Kasten Leveque K, PA-C   nystatin (MYCOSTATIN) 100000 UNIT/ML suspension Take 5 mLs (500,000 Units total) by mouth 4 (four) times daily. 60 mL Thomasine Klutts K, PA-C   valACYclovir (VALTREX) 1000 MG tablet Take 1 tablet (1,000 mg total) by mouth 2 (two) times daily. 30 tablet Crisol Muecke, Stephanie Retort, PA-C      PDMP not reviewed this encounter.   Stephanie Hawking, PA-C 06/09/22 2119

## 2022-06-10 LAB — CERVICOVAGINAL ANCILLARY ONLY
Bacterial Vaginitis (gardnerella): NEGATIVE
Candida Glabrata: NEGATIVE
Candida Vaginitis: POSITIVE — AB
Chlamydia: NEGATIVE
Comment: NEGATIVE
Comment: NEGATIVE
Comment: NEGATIVE
Comment: NEGATIVE
Comment: NEGATIVE
Comment: NORMAL
Neisseria Gonorrhea: NEGATIVE
Trichomonas: NEGATIVE

## 2022-07-21 ENCOUNTER — Other Ambulatory Visit: Payer: Self-pay

## 2022-07-21 ENCOUNTER — Encounter (HOSPITAL_COMMUNITY): Payer: Self-pay

## 2022-07-21 ENCOUNTER — Observation Stay (HOSPITAL_COMMUNITY)
Admission: EM | Admit: 2022-07-21 | Discharge: 2022-07-22 | Disposition: A | Discharge status: Home / Self Care | Payer: Self-pay | Attending: Internal Medicine | Admitting: Internal Medicine

## 2022-07-21 ENCOUNTER — Encounter: Payer: Self-pay | Admitting: Emergency Medicine

## 2022-07-21 ENCOUNTER — Ambulatory Visit
Admission: EM | Admit: 2022-07-21 | Discharge: 2022-07-21 | Disposition: A | Discharge status: Home / Self Care | Payer: Self-pay | Attending: Internal Medicine | Admitting: Internal Medicine

## 2022-07-21 DIAGNOSIS — B3731 Acute candidiasis of vulva and vagina: Secondary | ICD-10-CM | POA: Insufficient documentation

## 2022-07-21 DIAGNOSIS — Z6833 Body mass index (BMI) 33.0-33.9, adult: Secondary | ICD-10-CM | POA: Insufficient documentation

## 2022-07-21 DIAGNOSIS — E669 Obesity, unspecified: Secondary | ICD-10-CM | POA: Insufficient documentation

## 2022-07-21 DIAGNOSIS — E119 Type 2 diabetes mellitus without complications: Principal | ICD-10-CM | POA: Insufficient documentation

## 2022-07-21 DIAGNOSIS — R350 Frequency of micturition: Secondary | ICD-10-CM | POA: Insufficient documentation

## 2022-07-21 DIAGNOSIS — Z79899 Other long term (current) drug therapy: Secondary | ICD-10-CM | POA: Insufficient documentation

## 2022-07-21 DIAGNOSIS — R739 Hyperglycemia, unspecified: Secondary | ICD-10-CM

## 2022-07-21 DIAGNOSIS — R81 Glycosuria: Secondary | ICD-10-CM | POA: Insufficient documentation

## 2022-07-21 DIAGNOSIS — F17210 Nicotine dependence, cigarettes, uncomplicated: Secondary | ICD-10-CM | POA: Insufficient documentation

## 2022-07-21 DIAGNOSIS — N898 Other specified noninflammatory disorders of vagina: Secondary | ICD-10-CM | POA: Insufficient documentation

## 2022-07-21 DIAGNOSIS — Z794 Long term (current) use of insulin: Secondary | ICD-10-CM | POA: Insufficient documentation

## 2022-07-21 DIAGNOSIS — E871 Hypo-osmolality and hyponatremia: Secondary | ICD-10-CM | POA: Insufficient documentation

## 2022-07-21 DIAGNOSIS — E860 Dehydration: Secondary | ICD-10-CM | POA: Insufficient documentation

## 2022-07-21 LAB — CBC
HCT: 40.8 % (ref 36.0–46.0)
Hemoglobin: 13.7 g/dL (ref 12.0–15.0)
MCH: 30 pg (ref 26.0–34.0)
MCHC: 33.6 g/dL (ref 30.0–36.0)
MCV: 89.5 fL (ref 80.0–100.0)
Platelets: 206 10*3/uL (ref 150–400)
RBC: 4.56 MIL/uL (ref 3.87–5.11)
RDW: 12.2 % (ref 11.5–15.5)
WBC: 6.4 10*3/uL (ref 4.0–10.5)
nRBC: 0 % (ref 0.0–0.2)

## 2022-07-21 LAB — HEPATIC FUNCTION PANEL
ALT: 32 U/L (ref 0–44)
AST: 30 U/L (ref 15–41)
Albumin: 3.9 g/dL (ref 3.5–5.0)
Alkaline Phosphatase: 105 U/L (ref 38–126)
Bilirubin, Direct: 0.2 mg/dL (ref 0.0–0.2)
Indirect Bilirubin: 0.4 mg/dL (ref 0.3–0.9)
Total Bilirubin: 0.6 mg/dL (ref 0.3–1.2)
Total Protein: 8.1 g/dL (ref 6.5–8.1)

## 2022-07-21 LAB — URINALYSIS, ROUTINE W REFLEX MICROSCOPIC
Bacteria, UA: NONE SEEN
Bilirubin Urine: NEGATIVE
Glucose, UA: >=500 mg/dL — AB
Hgb urine dipstick: NEGATIVE
Ketones, ur: NEGATIVE mg/dL
Leukocytes,Ua: NEGATIVE
Nitrite: NEGATIVE
Protein, ur: NEGATIVE mg/dL
Specific Gravity, Urine: 1.03 (ref 1.005–1.030)
pH: 5 (ref 5.0–8.0)

## 2022-07-21 LAB — BASIC METABOLIC PANEL
Anion gap: 9 (ref 5–15)
BUN: 8 mg/dL (ref 6–20)
CO2: 26 mmol/L (ref 22–32)
Calcium: 9.3 mg/dL (ref 8.9–10.3)
Chloride: 94 mmol/L — ABNORMAL LOW (ref 98–111)
Creatinine, Ser: 0.89 mg/dL (ref 0.44–1.00)
GFR, Estimated: >60 mL/min (ref >60)
Glucose, Bld: 699 mg/dL (ref 70–99)
Potassium: 4.2 mmol/L (ref 3.5–5.1)
Sodium: 129 mmol/L — ABNORMAL LOW (ref 135–145)

## 2022-07-21 LAB — POCT URINALYSIS DIP (MANUAL ENTRY)
Bilirubin, UA: NEGATIVE
Blood, UA: NEGATIVE
Glucose, UA: 500 mg/dL — AB
Ketones, POC UA: NEGATIVE mg/dL
Leukocytes, UA: NEGATIVE
Nitrite, UA: NEGATIVE
Protein Ur, POC: NEGATIVE mg/dL
Spec Grav, UA: <=1.005 — AB (ref 1.010–1.025)
Urobilinogen, UA: 0.2 E.U./dL
pH, UA: 6 (ref 5.0–8.0)

## 2022-07-21 LAB — BLOOD GAS, VENOUS
Acid-base deficit: 5.1 mmol/L — ABNORMAL HIGH (ref 0.0–2.0)
Bicarbonate: 19.8 mmol/L — ABNORMAL LOW (ref 20.0–28.0)
O2 Saturation: 87.1 %
Patient temperature: 37
pCO2, Ven: 35 mmHg — ABNORMAL LOW (ref 44–60)
pH, Ven: 7.36 (ref 7.25–7.43)
pO2, Ven: 49 mmHg — ABNORMAL HIGH (ref 32–45)

## 2022-07-21 LAB — POCT FASTING CBG KUC MANUAL ENTRY

## 2022-07-21 LAB — GLUCOSE, CAPILLARY
Glucose-Capillary: 240 mg/dL — ABNORMAL HIGH (ref 70–99)
Glucose-Capillary: 359 mg/dL — ABNORMAL HIGH (ref 70–99)

## 2022-07-21 LAB — HCG, QUANTITATIVE, PREGNANCY: hCG, Beta Chain, Quant, S: 1 m[IU]/mL (ref <5)

## 2022-07-21 LAB — CBG MONITORING, ED
Glucose-Capillary: >600 mg/dL (ref 70–99)
Glucose-Capillary: 264 mg/dL — ABNORMAL HIGH (ref 70–99)
Glucose-Capillary: 408 mg/dL — ABNORMAL HIGH (ref 70–99)

## 2022-07-21 LAB — HIV ANTIBODY (ROUTINE TESTING W REFLEX): HIV Screen 4th Generation wRfx: NONREACTIVE

## 2022-07-21 LAB — TSH: TSH: 1.732 u[IU]/mL (ref 0.350–4.500)

## 2022-07-21 MED ORDER — INSULIN GLARGINE-YFGN 100 UNIT/ML ~~LOC~~ SOLN
10.0000 [IU] | Freq: Every day | SUBCUTANEOUS | Status: DC
  Administered 2022-07-21: 10 [IU] via SUBCUTANEOUS
  Filled 2022-07-21: qty 0.1

## 2022-07-21 MED ORDER — DEXTROSE 50 % IV SOLN: 0.0000 mL | INTRAVENOUS | Status: DC | PRN

## 2022-07-21 MED ORDER — INSULIN REGULAR(HUMAN) IN NACL 100-0.9 UT/100ML-% IV SOLN
INTRAVENOUS | Status: DC
  Administered 2022-07-21: 19 [IU]/h via INTRAVENOUS
  Filled 2022-07-21: qty 100

## 2022-07-21 MED ORDER — INSULIN ASPART 100 UNIT/ML IJ SOLN
0.0000 [IU] | Freq: Three times a day (TID) | INTRAMUSCULAR | Status: DC
  Administered 2022-07-21: 3 [IU] via SUBCUTANEOUS

## 2022-07-21 MED ORDER — INSULIN ASPART 100 UNIT/ML IJ SOLN
0.0000 [IU] | Freq: Every day | INTRAMUSCULAR | Status: DC
  Administered 2022-07-21: 5 [IU] via SUBCUTANEOUS

## 2022-07-21 MED ORDER — LACTATED RINGERS IV BOLUS
1000.0000 mL | Freq: Once | INTRAVENOUS | Status: AC
  Administered 2022-07-21: 1000 mL via INTRAVENOUS

## 2022-07-21 MED ORDER — DEXTROSE IN LACTATED RINGERS 5 % IV SOLN: INTRAVENOUS | Status: DC

## 2022-07-21 MED ORDER — LACTATED RINGERS IV SOLN: INTRAVENOUS | Status: DC

## 2022-07-21 MED ORDER — ENOXAPARIN SODIUM 40 MG/0.4ML IJ SOSY
40.0000 mg | PREFILLED_SYRINGE | INTRAMUSCULAR | Status: DC
  Administered 2022-07-21: 40 mg via SUBCUTANEOUS
  Filled 2022-07-21: qty 0.4

## 2022-07-21 NOTE — ED Provider Notes (Signed)
[facility]-EMERGENCY DEPT Provider Note   CSN: [id number] Arrival date & time: 07/21/22  1234     History {Add pertinent medical, surgical, social history, OB history to HPI:1} Chief Complaint  Patient presents with   Hyperglycemia    Stephanie Reid is a 31 y.o. female.  HPI      Urinary frequency over the last month, dry mouth, increased thirst Vision blurry over the last few weeks, no diplopia or visual field deficits Fatigue, sleepiness No abdominal pain, nausea, vomiting, chest pain, dyspnea   Father has hx of DM    Home Medications Prior to Admission medications   Medication Sig Start Date End Date Taking? Authorizing Provider  acetaminophen (TYLENOL) 500 MG tablet Take 500 mg by mouth every 6 (six) hours as needed for fever.    [provider]  Ascorbic Acid (VITAMIN C PO) Take 1 tablet by mouth daily.    [provider]  fluconazole (DIFLUCAN) 150 MG tablet Take 1 tablet (150 mg total) by mouth every 3 (three) days as needed. 06/09/22   Raspet, Noberto Retort, PA-C  nystatin (MYCOSTATIN) 100000 UNIT/ML suspension Take 5 mLs (500,000 Units total) by mouth 4 (four) times daily. 06/09/22   Raspet, Noberto Retort, PA-C  valACYclovir (VALTREX) 1000 MG tablet Take 1 tablet (1,000 mg total) by mouth 2 (two) times daily. 06/09/22   Raspet, Noberto Retort, PA-C  promethazine (PHENERGAN) 12.5 MG tablet Take 1 tablet (12.5 mg total) by mouth every 6 (six) hours as needed for nausea or vomiting. 12/09/20 02/06/21  Milagros Loll, MD      Allergies    Apple juice, Banana, Orange fruit [citrus], Lactase-lactobacillus, Spinach, Vicodin [hydrocodone-acetaminophen], and Penicillin g    Review of Systems   Review of Systems  Physical Exam Updated Vital Signs BP 120/79   Pulse (!) 59   Temp 98.2 F (36.8 C) (Oral)   Resp 16   Ht 5\' 5"  (1.651 m)   Wt 90.7 kg   LMP 12/04/2009   SpO2 100%   BMI 33.28 kg/m  Physical Exam Vitals and nursing note reviewed.   Constitutional:      General: She is not in acute distress.    Appearance: She is well-developed. She is not diaphoretic.  HENT:     Head: Normocephalic and atraumatic.  Eyes:     Conjunctiva/sclera: Conjunctivae normal.  Cardiovascular:     Rate and Rhythm: Normal rate and regular rhythm.     Heart sounds: Normal heart sounds. No murmur heard.    No friction rub. No gallop.  Pulmonary:     Effort: Pulmonary effort is normal. No respiratory distress.     Breath sounds: Normal breath sounds. No wheezing or rales.  Abdominal:     General: There is no distension.     Palpations: Abdomen is soft.     Tenderness: There is no abdominal tenderness. There is no guarding.  Musculoskeletal:        General: No tenderness.     Cervical back: Normal range of motion.  Skin:    General: Skin is warm and dry.     Findings: No erythema or rash.  Neurological:     Mental Status: She is alert and oriented to person, place, and time.     ED Results / Procedures / Treatments   Labs (all labs ordered are listed, but only abnormal results are displayed) Labs Reviewed  BASIC METABOLIC PANEL - Abnormal; Notable for the following components:  Result Value   Sodium 129 (*)    Chloride 94 (*)    Glucose, Bld 699 (*)    All other components within normal limits  URINALYSIS, ROUTINE W REFLEX MICROSCOPIC - Abnormal; Notable for the following components:   Color, Urine COLORLESS (*)    Glucose, UA >=500 (*)    All other components within normal limits  BLOOD GAS, VENOUS - Abnormal; Notable for the following components:   pCO2, Ven 35 (*)    pO2, Ven 49 (*)    Bicarbonate 19.8 (*)    Acid-base deficit 5.1 (*)    All other components within normal limits  CBG MONITORING, ED - Abnormal; Notable for the following components:   Glucose-Capillary >600 (*)    All other components within normal limits  CBG MONITORING, ED - Abnormal; Notable for the following components:   Glucose-Capillary 408 (*)     All other components within normal limits  CBC  HEPATIC FUNCTION PANEL  HCG, QUANTITATIVE, PREGNANCY  BETA-HYDROXYBUTYRIC ACID    EKG None  Radiology No results found.  Procedures Procedures  {Document cardiac monitor, telemetry assessment procedure when appropriate:1}  Medications Ordered in ED Medications  insulin regular, human (MYXREDLIN) 100 units/ 100 mL infusion (9.5 Units/hr Intravenous Rate/Dose Change 07/21/22 1453)  lactated ringers infusion ( Intravenous New Bag/Given 07/21/22 1409)  dextrose 5 % in lactated ringers infusion (has no administration in time range)  dextrose 50 % solution 0-50 mL (has no administration in time range)    ED Course/ Medical Decision Making/ A&P                           Medical Decision Making Amount and/or Complexity of Data Reviewed Labs: ordered.  Risk Prescription drug management.   ***  {Document critical care time when appropriate:1} {Document review of labs and clinical decision tools ie heart score, Chads2Vasc2 etc:1}  {Document your independent review of radiology images, and any outside records:1} {Document your discussion with family members, caretakers, and with consultants:1} {Document social determinants of health affecting pt's care:1} {Document your decision making why or why not admission, treatments were needed:1} Final Clinical Impression(s) / ED Diagnoses Final diagnoses:  None    Rx / DC Orders ED Discharge Orders     None

## 2022-07-21 NOTE — ED Triage Notes (Signed)
Pt reports going to UC for possible yeast infection. Pt reports they checked CBG over there and it read higher than 600. Pt reports chronic blurred vision. Denies headache, dizziness, and N/V.

## 2022-07-21 NOTE — ED Triage Notes (Signed)
Pt is present today with vaginal irritation. Pt sx started one week ago.

## 2022-07-21 NOTE — ED Notes (Signed)
Patient is being discharged from the Urgent Care and sent to the Emergency Department via POV . Per Ervin Knack NP, patient is in need of higher level of care due to hyperglycemia. Patient is aware and verbalizes understanding of plan of care.  Vitals:   07/21/22 1131  BP: 120/77  Pulse: 74  Resp: 18  Temp: (!) 97.3 F (36.3 C)  SpO2: 98%

## 2022-07-21 NOTE — ED Provider Triage Note (Signed)
Emergency Medicine Provider Triage Evaluation Note  Stephanie Reid , a 31 y.o. female  was evaluated in triage.  Pt complains of originally presented to UC for vaginal irritation and was told her blood sugar for over 600. She reports that she has been more thirsty and urinating more frequently for the past month. Denies any abdominal pain, nausea, or vomiting .  Review of Systems  Positive:  Negative:   Physical Exam  BP 126/81 (BP Location: Right Arm)   Pulse 72   Temp 98.2 F (36.8 C) (Oral)   Resp 16   LMP 12/04/2009   SpO2 99%  Gen:   Awake, no distress   Resp:  Normal effort  MSK:   Moves extremities without difficulty  Other:    Medical Decision Making  Medically screening exam initiated at 1:03 PM.  Appropriate orders placed.  Stephanie Reid was informed that the remainder of the evaluation will be completed by another provider, this initial triage assessment does not replace that evaluation, and the importance of remaining in the ED until their evaluation is complete.  Hyperglycemia ordered placed   Achille Rich, [place] 07/21/22 1304

## 2022-07-21 NOTE — H&P (Signed)
History and Physical    Stephanie Reid PXT:[id number] DOB: 03-26-1991 DOA: 07/21/2022  PCP: Pcp, No   I have briefly reviewed patients previous medical reports in [facility].  Patient coming from: Home  Chief Complaint: 1 month history of polyuria, polydipsia, blurred vision and 1 week history of vaginal pruritus.  HPI: Stephanie Reid is a 31 year old female, lives with her cousin, works as a Equities trader at a [employer], PMH of STDs (bacterial vaginosis, chlamydia, gonorrhea, trichomoniasis) but otherwise no other PMH, presented to the [facility] ED on 07/21/2022 with above complaints.  She reports approximately 1 month history of polyuria where she goes several times to urinate in the daytime and up to 10 times at night, drinks 10 bottles of water (approximately 5 L total) daily, has noted blurred vision without diplopia in both eyes, has been weak and lethargic, decreased appetite and has been eating only 1 time daily.  She reports 1 week history of vaginal pruritus without discharge.  He went to urgent care due to this where CBG checks read as "high" and patient was sent to the ED for further evaluation.  Family history of DM in her father and grandmother.  She denies any other complaints.  ED Course: Vital signs stable.  Lab work significant for blood glucose 699, sodium 129, negative beta-hCG.  Normal bicarbonate and anion gap.  VBG showed pH of 7.36.  Did not get an IV fluid bolus, was placed on IV fluids and insulin drip.  Within a couple of hours, CBG had improved to 264 and insulin drip was discontinued.  Hospitalist admission was requested.  Review of Systems:  All other systems reviewed and apart from HPI, are negative.  Past Medical History:  Diagnosis Date   Bacterial vaginosis    Herpes simplex    Hx of chlamydia infection    Hx of gonorrhea    Hx of trichomoniasis    Seasonal allergies     Past Surgical History:  Procedure  Laterality Date   NO PAST SURGERIES      Social History  reports that she has been smoking cigarettes. She has been smoking an average of .1 packs per day. She has never used smokeless tobacco. She reports current alcohol use. She reports current drug use. Drug: Marijuana.  Patient reports that since her symptoms above, she has only been able to smoke 1 cigarette/day.  She drinks only on weekends, undetermined amount of tequila.  Denies any other drug use.  Allergies  Allergen Reactions   Apple Juice Anaphylaxis   Banana Anaphylaxis   Orange Fruit [Citrus] Anaphylaxis   Lactase-Lactobacillus Other (See Comments)    hemmorrhoids   Spinach Diarrhea   Vicodin [Hydrocodone-Acetaminophen] Nausea And Vomiting   Penicillin G      GI Upset (intolerance) Did it involve swelling of the face/tongue/throat, SOB, or low BP? No Did it involve sudden or severe rash/hives, skin peeling, or any reaction on the inside of your mouth or nose? No Did you need to seek medical attention at a hospital or doctor's office? No When did it last happen?  not recent If all above answers are "NO", may proceed with cephalosporin use.     Family History  Problem Relation Age of Onset   Hypertension Mother    Heart disease Mother    Stroke Mother    Diabetes Maternal Grandmother      Prior to Admission medications   Medication Sig Start Date End Date  Taking? Authorizing Provider  acetaminophen (TYLENOL) 500 MG tablet Take 500 mg by mouth every 6 (six) hours as needed for fever.    [provider]  Ascorbic Acid (VITAMIN C PO) Take 1 tablet by mouth daily.    [provider]  fluconazole (DIFLUCAN) 150 MG tablet Take 1 tablet (150 mg total) by mouth every 3 (three) days as needed. 06/09/22   Raspet, Noberto Retort, PA-C  nystatin (MYCOSTATIN) 100000 UNIT/ML suspension Take 5 mLs (500,000 Units total) by mouth 4 (four) times daily. 06/09/22   Raspet, Noberto Retort, PA-C  valACYclovir (VALTREX) 1000 MG tablet  Take 1 tablet (1,000 mg total) by mouth 2 (two) times daily. 06/09/22   Raspet, Noberto Retort, PA-C  promethazine (PHENERGAN) 12.5 MG tablet Take 1 tablet (12.5 mg total) by mouth every 6 (six) hours as needed for nausea or vomiting. 12/09/20 02/06/21  Milagros Loll, MD    Physical Exam: Vitals:   07/21/22 1440 07/21/22 1500 07/21/22 1510 07/21/22 1600  BP: 120/79 115/74 116/79 117/77  Pulse: (!) 59 (!) 58 69 66  Resp: 16 16 16 15   Temp:      TempSrc:      SpO2: 100% 100% 100% 100%  Weight:      Height:          Constitutional: Young female, moderately built and obese lying comfortably propped up in bed without distress.  Oral mucosa with borderline hydration. Eyes: PERTLA, lids and conjunctivae normal.??  Bilateral immature cataract (in this young female). ENMT: Mucous membranes are borderline hydration.  Posterior pharynx clear of any exudate or lesions. Normal dentition.  No oral thrush. Neck: supple, no masses, no thyromegaly Respiratory: Clear to auscultation without wheezing, rhonchi or crackles. No increased work of breathing. Cardiovascular: S1 & S2 heard, regular rate and rhythm. No JVD, murmurs, rubs or clicks. No pedal edema. Abdomen: Non distended. Non tender. Soft. No organomegaly or masses appreciated. No clinical Ascites. Normal bowel sounds heard. Musculoskeletal: no clubbing / cyanosis. No joint deformity upper and lower extremities. Good ROM, no contractures. Normal muscle tone.  Skin: no rashes, lesions, ulcers. No induration.  Multiple tattoos over the body. Neurologic: CN 2-12 grossly intact. Sensation intact, DTR normal. Strength 5/5 in all 4 limbs.  Psychiatric: Normal judgment and insight. Alert and oriented x 3. Normal mood.     Labs on Admission: I have personally reviewed following labs and imaging studies  CBC: Recent Labs  Lab 07/21/22 1252  WBC 6.4  HGB 13.7  HCT 40.8  MCV 89.5  PLT 206    Basic Metabolic Panel: Recent Labs  Lab 07/21/22 1252   NA 129*  K 4.2  CL 94*  CO2 26  GLUCOSE 699*  BUN 8  CREATININE 0.89  CALCIUM 9.3    Liver Function Tests: Recent Labs  Lab 07/21/22 1252  AST 30  ALT 32  ALKPHOS 105  BILITOT 0.6  PROT 8.1  ALBUMIN 3.9    Urine analysis:    Component Value Date/Time   COLORURINE COLORLESS (A) 07/21/2022 1310   APPEARANCEUR CLEAR 07/21/2022 1310   LABSPEC 1.030 07/21/2022 1310   PHURINE 5.0 07/21/2022 1310   GLUCOSEU >=500 (A) 07/21/2022 1310   HGBUR NEGATIVE 07/21/2022 1310   BILIRUBINUR NEGATIVE 07/21/2022 1310   BILIRUBINUR negative 07/21/2022 1149   KETONESUR NEGATIVE 07/21/2022 1310   KETONESUR negative 07/21/2022 1149   PROTEINUR NEGATIVE 07/21/2022 1310   PROTEINUR negative 07/21/2022 1149   UROBILINOGEN 0.2 07/21/2022 1149   UROBILINOGEN 0.2  02/09/2020 0920   NITRITE NEGATIVE 07/21/2022 1310   NITRITE Negative 07/21/2022 1149   LEUKOCYTESUR NEGATIVE 07/21/2022 1310   LEUKOCYTESUR Negative 07/21/2022 1149     Radiological Exams on Admission: No results found.    Assessment/Plan Principal Problem:   New onset type 2 diabetes mellitus (HCC) Active Problems:   Dehydration   Vaginal candidiasis     New onset type II DM: Strong family history of DM and patient's father and grandmother.  1 month history of polyuria, polydipsia, unintentional 20 pound weight loss and blurred vision.  Presented to the ED with blood glucose of 699 mg per DL.  Briefly placed on insulin drip, required approximately 33 units and CBG dropped to 264 mg per DL and insulin drip was discontinued before initiation of subcutaneous insulins.  We will bolus with a liter of LR, continue LR infusion thereafter at 150 mL/h overnight.  Diabetic diet.  Started Semglee 10 units nightly, SSI and consider adding mealtime NovoLog.  Will need extensive DM education.  A1c requested and suspect will be >10 needing some form of insulins at discharge.  Consulted TOC for PCP and medication needs.  Consulted diabetes  coordinator for education of all aspects of DM care including insulin management.  Dietitian consultation.  Dehydration with hyponatremia: Some of the hyponatremia is pseudohyponatremia from hyperglycemia.  IV fluids.  Oral ad lib. intake.  Vaginal candidiasis: Patient wants to be tested for STDs.  Requested GC and Chlamydia probe.  Treat with a dose of fluconazole.  Pregnancy test negative.  Reports unprotected sexual intercourse.  Vaginal exam deferred.  Body mass index is 33.28 kg/m.  Obesity: Will need lifestyle modifications and weight loss.  Tobacco and alcohol use: Cessation counseled.    DVT prophylaxis: Lovenox Code Status: Full Family Communication: None at bedside Disposition Plan:   Patient is from:  Home  Anticipated DC to:  Home  Anticipated DC date:  07/22/2022  Anticipated DC barriers: None   Consults called: None Admission status: Observation, medical bed  Severity of Illness: The appropriate patient status for this patient is OBSERVATION. Observation status is judged to be reasonable and necessary in order to provide the required intensity of service to ensure the patient's safety. The patient's presenting symptoms, physical exam findings, and initial radiographic and laboratory data in the context of their medical condition is felt to place them at decreased risk for further clinical deterioration. Furthermore, it is anticipated that the patient will be medically stable for discharge from the hospital within 2 midnights of admission.     Marcellus Scott MD [facility]  To contact the attending provider between 7A-7P or the covering provider during after hours 7P-7A, please log into the web site [email or web address] and access using universal [place] password for that web site. If you do not have the password, please call the hospital operator.  07/21/2022, 4:49 PM

## 2022-07-21 NOTE — Plan of Care (Signed)
  Problem: Education: Goal: Knowledge of General Education information will improve Description: Including pain rating scale, medication(s)/side effects and non-pharmacologic comfort measures 07/21/2022 2316 by Virgilio Belling, RN Outcome: Progressing 07/21/2022 2316 by Virgilio Belling, RN Outcome: Progressing   Problem: Activity: Goal: Risk for activity intolerance will decrease Outcome: Progressing   Problem: Pain Managment: Goal: General experience of comfort will improve Outcome: Progressing   Problem: Safety: Goal: Ability to remain free from injury will improve 07/21/2022 2316 by Virgilio Belling, RN Outcome: Progressing 07/21/2022 2316 by Virgilio Belling, RN Outcome: Progressing   Problem: Metabolic: Goal: Ability to maintain appropriate glucose levels will improve Outcome: Progressing

## 2022-07-21 NOTE — Plan of Care (Signed)
  Problem: Education: Goal: Knowledge of General Education information will improve Description: Including pain rating scale, medication(s)/side effects and non-pharmacologic comfort measures Outcome: Progressing   Problem: Health Behavior/Discharge Planning: Goal: Ability to manage health-related needs will improve Outcome: Progressing   Problem: Clinical Measurements: Goal: Diagnostic test results will improve Outcome: Progressing   

## 2022-07-21 NOTE — Discharge Instructions (Signed)
Go to the emergency department as soon as you leave urgent care for further evaluation and management given that you have high blood sugar.

## 2022-07-21 NOTE — ED Provider Notes (Addendum)
[facility]    CSN: [id number] Arrival date & time: 07/21/22  1112      History   Chief Complaint Chief Complaint  Patient presents with   Vaginitis    HPI Stephanie Reid is a 31 y.o. female.   Patient presents with urinary frequency, vaginal irritation, white vaginal discharge that started about a week ago.  Denies urinary burning, abnormal vaginal bleeding, abdominal pain, back pain, fever.  Symptoms started after having unprotected sexual intercourse.  Denies any confirmed exposure to STD.     Past Medical History:  Diagnosis Date   Bacterial vaginosis    Herpes simplex    Hx of chlamydia infection    Hx of gonorrhea    Hx of trichomoniasis    Seasonal allergies     Patient Active Problem List   Diagnosis Date Noted   Amenorrhea, secondary 10/06/2013    Past Surgical History:  Procedure Laterality Date   NO PAST SURGERIES      OB History     Gravida  0   Para  0   Term  0   Preterm  0   AB  0   Living  0      SAB  0   IAB  0   Ectopic  0   Multiple  0   Live Births  0            Home Medications    Prior to Admission medications   Medication Sig Start Date End Date Taking? Authorizing Provider  acetaminophen (TYLENOL) 500 MG tablet Take 500 mg by mouth every 6 (six) hours as needed for fever.    [provider]  Ascorbic Acid (VITAMIN C PO) Take 1 tablet by mouth daily.    [provider]  fluconazole (DIFLUCAN) 150 MG tablet Take 1 tablet (150 mg total) by mouth every 3 (three) days as needed. 06/09/22   Raspet, Noberto Retort, PA-C  nystatin (MYCOSTATIN) 100000 UNIT/ML suspension Take 5 mLs (500,000 Units total) by mouth 4 (four) times daily. 06/09/22   Raspet, Noberto Retort, PA-C  valACYclovir (VALTREX) 1000 MG tablet Take 1 tablet (1,000 mg total) by mouth 2 (two) times daily. 06/09/22   Raspet, Noberto Retort, PA-C  promethazine (PHENERGAN) 12.5 MG tablet Take 1 tablet (12.5 mg total) by mouth every 6 (six) hours as  needed for nausea or vomiting. 12/09/20 02/06/21  Milagros Loll, MD    Family History Family History  Problem Relation Age of Onset   Hypertension Mother    Heart disease Mother    Stroke Mother    Diabetes Maternal Grandmother     Social History Social History   Tobacco Use   Smoking status: Every Day    Packs/day: 0.10    Types: Cigarettes   Smokeless tobacco: Never  Vaping Use   Vaping Use: Never used  Substance Use Topics   Alcohol use: Yes    Comment: on weekends   Drug use: Yes    Types: Marijuana     Allergies   Apple juice, Banana, Orange fruit [citrus], Lactase-lactobacillus, Spinach, Vicodin [hydrocodone-acetaminophen], and Penicillin g   Review of Systems Review of Systems Per HPI  Physical Exam Triage Vital Signs ED Triage Vitals  Enc Vitals Group     BP 07/21/22 1131 120/77     Pulse Rate 07/21/22 1131 74     Resp 07/21/22 1131 18     Temp 07/21/22 1131 (!) 97.3 F (36.3 C)  Temp src --      SpO2 07/21/22 1131 98 %     Weight --      Height --      Head Circumference --      Peak Flow --      Pain Score 07/21/22 1134 0     Pain Loc --      Pain Edu? --      Excl. in GC? --    No data found.  Updated Vital Signs BP 120/77   Pulse 74   Temp (!) 97.3 F (36.3 C)   Resp 18   LMP 12/04/2009   SpO2 98%   Visual Acuity Right Eye Distance:   Left Eye Distance:   Bilateral Distance:    Right Eye Near:   Left Eye Near:    Bilateral Near:     Physical Exam Constitutional:      General: She is not in acute distress.    Appearance: Normal appearance. She is not toxic-appearing or diaphoretic.  HENT:     Head: Normocephalic and atraumatic.  Eyes:     Extraocular Movements: Extraocular movements intact.     Conjunctiva/sclera: Conjunctivae normal.  Cardiovascular:     Rate and Rhythm: Normal rate and regular rhythm.     Pulses: Normal pulses.     Heart sounds: Normal heart sounds.  Pulmonary:     Effort: Pulmonary effort  is normal. No respiratory distress.     Breath sounds: Normal breath sounds.  Abdominal:     General: Bowel sounds are normal. There is no distension.     Palpations: Abdomen is soft.     Tenderness: There is no abdominal tenderness.  Genitourinary:    Comments: Deferred with shared decision-making.  Self swab performed. Neurological:     General: No focal deficit present.     Mental Status: She is alert and oriented to person, place, and time. Mental status is at baseline.  Psychiatric:        Mood and Affect: Mood normal.        Behavior: Behavior normal.        Thought Content: Thought content normal.        Judgment: Judgment normal.      UC Treatments / Results  Labs (all labs ordered are listed, but only abnormal results are displayed) Labs Reviewed  POCT URINALYSIS DIP (MANUAL ENTRY) - Abnormal; Notable for the following components:      Result Value   Glucose, UA =500 (*)    Spec Grav, UA <=1.005 (*)    All other components within normal limits  POCT FASTING CBG [facility] MANUAL ENTRY - Abnormal; Notable for the following components:   POCT Glucose ([facility])   (*)    All other components within normal limits  CERVICOVAGINAL ANCILLARY ONLY    EKG   Radiology No results found.  Procedures Procedures (including critical care time)  Medications Ordered in UC Medications - No data to display  Initial Impression / Assessment and Plan / UC Course  I have reviewed the triage vital signs and the nursing notes.  Pertinent labs & imaging results that were available during my care of the patient were reviewed by me and considered in my medical decision making (see chart for details).     Urinalysis showing large amount of glucose in urine.  CBG was completed that showed "high" but  would not read on CBG monitor.  Advised patient that she will need to go to  the emergency department for further evaluation and management of this given limited resources here in urgent care.   Patient was agreeable with plan.  Vital signs and patient stable at discharge.  Agree with patient self transport to the hospital.  Patient denied that she had ever been diagnosed with diabetes.  Highly suspicious that patient could have vaginal yeast infection but will await cervicovaginal swab.  Cervicovaginal swab is currently pending.  Patient advised of this. Final Clinical Impressions(s) / UC Diagnoses   Final diagnoses:  Hyperglycemia  Glycosuria  Urinary frequency  Vaginal discharge     Discharge Instructions      Go to the emergency department as soon as you leave urgent care for further evaluation and management given that you have high blood sugar.    ED Prescriptions   None    PDMP not reviewed this encounter.   Gustavus Bryant, [place] 07/21/22 1216    [address], [place] 07/21/22 1217

## 2022-07-22 LAB — BASIC METABOLIC PANEL
Anion gap: 6 (ref 5–15)
BUN: 9 mg/dL (ref 6–20)
CO2: 26 mmol/L (ref 22–32)
Calcium: 8.6 mg/dL — ABNORMAL LOW (ref 8.9–10.3)
Chloride: 103 mmol/L (ref 98–111)
Creatinine, Ser: 0.7 mg/dL (ref 0.44–1.00)
GFR, Estimated: >60 mL/min (ref >60)
Glucose, Bld: 392 mg/dL — ABNORMAL HIGH (ref 70–99)
Potassium: 3.7 mmol/L (ref 3.5–5.1)
Sodium: 135 mmol/L (ref 135–145)

## 2022-07-22 LAB — CERVICOVAGINAL ANCILLARY ONLY
Bacterial Vaginitis (gardnerella): POSITIVE — AB
Candida Glabrata: POSITIVE — AB
Candida Vaginitis: POSITIVE — AB
Chlamydia: NEGATIVE
Comment: NEGATIVE
Comment: NEGATIVE
Comment: NEGATIVE
Comment: NEGATIVE
Comment: NEGATIVE
Comment: NORMAL
Neisseria Gonorrhea: NEGATIVE
Trichomonas: NEGATIVE

## 2022-07-22 LAB — GC/CHLAMYDIA PROBE AMP (~~LOC~~) NOT AT ARMC
Chlamydia: NEGATIVE
Comment: NEGATIVE
Comment: NORMAL
Neisseria Gonorrhea: NEGATIVE

## 2022-07-22 LAB — CBC
HCT: 35.9 % — ABNORMAL LOW (ref 36.0–46.0)
Hemoglobin: 12.1 g/dL (ref 12.0–15.0)
MCH: 30.3 pg (ref 26.0–34.0)
MCHC: 33.7 g/dL (ref 30.0–36.0)
MCV: 90 fL (ref 80.0–100.0)
Platelets: 169 10*3/uL (ref 150–400)
RBC: 3.99 MIL/uL (ref 3.87–5.11)
RDW: 12 % (ref 11.5–15.5)
WBC: 6.9 10*3/uL (ref 4.0–10.5)
nRBC: 0 % (ref 0.0–0.2)

## 2022-07-22 LAB — GLUCOSE, CAPILLARY
Glucose-Capillary: 330 mg/dL — ABNORMAL HIGH (ref 70–99)
Glucose-Capillary: 373 mg/dL — ABNORMAL HIGH (ref 70–99)

## 2022-07-22 MED ORDER — INSULIN ASPART 100 UNIT/ML IJ SOLN: 0.0000 [IU] | Freq: Every day | INTRAMUSCULAR | Status: DC

## 2022-07-22 MED ORDER — INSULIN STARTER KIT- PEN NEEDLES (ENGLISH)
1.0000 | Freq: Once | Status: AC
Start: 2022-07-22 — End: 2022-07-22
  Administered 2022-07-22: 1
  Filled 2022-07-22: qty 1

## 2022-07-22 MED ORDER — IBUPROFEN 400 MG PO TABS
600.0000 mg | ORAL_TABLET | Freq: Four times a day (QID) | ORAL | Status: DC | PRN
  Administered 2022-07-22: 600 mg via ORAL
  Filled 2022-07-22: qty 1

## 2022-07-22 MED ORDER — NOVOLIN 70/30 FLEXPEN RELION (70-30) 100 UNIT/ML ~~LOC~~ SUPN: 15.0000 [IU] | PEN_INJECTOR | Freq: Two times a day (BID) | SUBCUTANEOUS | 0 refills | Status: DC

## 2022-07-22 MED ORDER — FLUCONAZOLE 150 MG PO TABS
150.0000 mg | ORAL_TABLET | Freq: Once | ORAL | Status: AC
  Administered 2022-07-22: 150 mg via ORAL
  Filled 2022-07-22: qty 1

## 2022-07-22 MED ORDER — BLOOD GLUCOSE METER KIT: PACK | 0 refills | Status: AC

## 2022-07-22 MED ORDER — LIVING WELL WITH DIABETES BOOK
Freq: Once | Status: AC
  Filled 2022-07-22: qty 1

## 2022-07-22 MED ORDER — "PEN NEEDLES 3/16"" 31G X 5 MM MISC": 0 refills | Status: AC

## 2022-07-22 MED ORDER — INSULIN GLARGINE-YFGN 100 UNIT/ML ~~LOC~~ SOLN
20.0000 [IU] | Freq: Every day | SUBCUTANEOUS | Status: DC
  Administered 2022-07-22: 20 [IU] via SUBCUTANEOUS
  Filled 2022-07-22: qty 0.2

## 2022-07-22 MED ORDER — INSULIN ASPART 100 UNIT/ML IJ SOLN
0.0000 [IU] | Freq: Three times a day (TID) | INTRAMUSCULAR | Status: DC
  Administered 2022-07-22: 11 [IU] via SUBCUTANEOUS
  Administered 2022-07-22: 15 [IU] via SUBCUTANEOUS

## 2022-07-22 MED ORDER — INSULIN ASPART 100 UNIT/ML IJ SOLN
4.0000 [IU] | Freq: Three times a day (TID) | INTRAMUSCULAR | Status: DC
  Administered 2022-07-22 (×2): 4 [IU] via SUBCUTANEOUS

## 2022-07-22 NOTE — TOC Initial Note (Addendum)
Transition of Care [facility]) - Initial/Assessment Note   Patient Details  Name: Stephanie Reid MRN: [id number] Date of Birth: 06-08-1991  Transition of Care [facility]) CM/SW Contact:    Ewing Schlein, LCSW Phone Number: 07/22/2022, 12:15 PM  Clinical Narrative: [facility] consulted for PCP needs and possible medication assistance. Patient is agreeable to a hospital follow up appointment being scheduled at [facility]. The hospital follow up appointment is scheduled for Wednesday July 31, 2022 at 3:30pm. Patient will see Dr. Adron Bene. Appointment added to AVS and patient updated. TOC to follow for possible medication assistance needs.  Addendum: TOC notified by RN that patient reported she can afford her insulin, so TOC is signing off.  Expected Discharge Plan: Home/Self Care Barriers to Discharge: Continued Medical Work up, Inadequate or no insurance  Patient Goals and CMS Choice Patient states their goals for this hospitalization and ongoing recovery are:: Get set up with PCP Choice offered to / list presented to : NA  Expected Discharge Plan and Services Expected Discharge Plan: Home/Self Care In-house Referral: Clinical Social Work Discharge Planning Services: Follow-up appt scheduled Post Acute Care Choice: NA Living arrangements for the past 2 months: Apartment            DME Arranged: N/A DME Agency: NA  Prior Living Arrangements/Services Living arrangements for the past 2 months: Apartment Patient language and need for interpreter reviewed:: Yes Do you feel safe going back to the place where you live?: Yes      Care giver support system in place?: Yes (comment) Criminal Activity/Legal Involvement Pertinent to Current Situation/Hospitalization: No - Comment as needed  Activities of Daily Living Home Assistive Devices/Equipment: None ADL Screening (condition at time of admission) Patient's cognitive ability adequate to safely complete daily activities?: Yes Is  the patient deaf or have difficulty hearing?: No Does the patient have difficulty seeing, even when wearing glasses/contacts?: No Does the patient have difficulty concentrating, remembering, or making decisions?: No Patient able to express need for assistance with ADLs?: Yes Does the patient have difficulty dressing or bathing?: No Independently performs ADLs?: Yes (appropriate for developmental age) Does the patient have difficulty walking or climbing stairs?: No Weakness of Legs: None Weakness of Arms/Hands: None  Permission Sought/Granted Permission sought to share information with : Other (comment) Permission granted to share information with : Yes, Verbal Permission Granted Permission granted to share info w AGENCY: [facility]  Emotional Assessment Appearance:: Appears stated age Attitude/Demeanor/Rapport: Engaged Affect (typically observed): Accepting Orientation: : Oriented to Self, Oriented to Place, Oriented to  Time, Oriented to Situation Alcohol / Substance Use: Tobacco Use Psych Involvement: No (comment)  Admission diagnosis:  New onset type 2 diabetes mellitus (HCC) [E11.9] Patient Active Problem List   Diagnosis Date Noted   New onset type 2 diabetes mellitus (HCC) 07/21/2022   Dehydration 07/21/2022   Vaginal candidiasis 07/21/2022   Amenorrhea, secondary 10/06/2013   PCP:  Oneita Hurt No Pharmacy:   [id number] [address] ([address]), [place] - [address] [place] ([address]) [place] [address] Phone: [phone] Fax: [phone]  Readmission Risk Interventions     No data to display

## 2022-07-22 NOTE — Plan of Care (Signed)
  RD consulted for nutrition education regarding diabetes.   No results found for: "HGBA1C"   Labs reviewed: CBGS: 240-359 (inpatient orders for glycemic control are 0-15 units insulin aspart TID, 0-5 units insulin aspart daily, and 4 units insulin aspart TID, and 20 units insulin glargine-yfgn daily).    Spoke with pt who reports feeling "in shock" over her diagnosis. Pt reports she typically consumes 1 meal per day- she works at [employer] and meals consist of anything from a burger and fries and a salad and potatoes. Pt reports she only drinks water.   Per pt, DM coordinator just came to visit her and and she was able to teachback basics of DM self-management to this RD. Pt with no further questions and feels comfortable with insulin injections. RD will also refer pt to [facility] for additional support. Pt reports she has just been connected to a PCP and explained importance of self-management to prevent complications.   Case discussed with RN, MD, and DM coordinator.   RD provided "Carbohydrate Counting for People with Diabetes" handout from the Academy of Nutrition and Dietetics. Discussed different food groups and their effects on blood sugar, emphasizing carbohydrate-containing foods. Provided list of carbohydrates and recommended serving sizes of common foods.  Discussed importance of controlled and consistent carbohydrate intake throughout the day. Provided examples of ways to balance meals/snacks and encouraged intake of high-fiber, whole grain complex carbohydrates. Teach back method used.  Expect fair to good compliance.  Body mass index is 33.28 kg/m. Pt meets criteria for obesity, class I based on current BMI. Obesity is a complex, chronic medical condition that is optimally managed by a multidisciplinary care team. Weight loss is not an ideal goal for an acute inpatient hospitalization. However, if further work-up for obesity is warranted, consider outpatient referral to outpatient  bariatric service and/or [place].    Current diet order is carb modified, patient is consuming approximately 100% of meals at this time. Labs and medications reviewed. No further nutrition interventions warranted at this time. RD contact information provided. If additional nutrition issues arise, please re-consult RD.  Levada Schilling, RD, LDN, CDCES Registered Dietitian II Certified Diabetes Care and Education Specialist Please refer to [facility] for RD and/or RD on-call/weekend/after hours pager

## 2022-07-22 NOTE — Discharge Instructions (Addendum)
Carbohydrate Counting For People With Diabetes  Foods with carbohydrates make your blood glucose level go up. Learning how to count carbohydrates can help you control your blood glucose levels. First, identify the foods you eat that contain carbohydrates. Then, using the Foods with Carbohydrates chart, determine about how much carbohydrates are in your meals and snacks. Make sure you are eating foods with fiber, protein, and healthy fat along with your carbohydrate foods. Foods with Carbohydrates The following table shows carbohydrate foods that have about 15 grams of carbohydrate each. Using measuring cups, spoons, or a food scale when you first begin learning about carbohydrate counting can help you learn about the portion sizes you typically eat. The following foods have 15 grams carbohydrate each:  Grains 1 slice bread (1 ounce)  1 small tortilla (6-inch size)   large bagel (1 ounce)  1/3 cup pasta or rice (cooked)   hamburger or hot dog bun ( ounce)   cup cooked cereal   to  cup ready-to-eat cereal  2 taco shells (5-inch size) Fruit 1 small fresh fruit ( to 1 cup)   medium banana  17 small grapes (3 ounces)  1 cup melon or berries   cup canned or frozen fruit  2 tablespoons dried fruit (blueberries, cherries, cranberries, raisins)   cup unsweetened fruit juice  Starchy Vegetables  cup cooked beans, peas, corn, potatoes/sweet potatoes   large baked potato (3 ounces)  1 cup acorn or butternut squash  Snack Foods 3 to 6 crackers  8 potato chips or 13 tortilla chips ( ounce to 1 ounce)  3 cups popped popcorn  Dairy 3/4 cup (6 ounces) nonfat plain yogurt, or yogurt with sugar-free sweetener  1 cup milk  1 cup plain rice, soy, coconut or flavored almond milk Sweets and Desserts  cup ice cream or frozen yogurt  1 tablespoon jam, jelly, pancake syrup, table sugar, or honey  2 tablespoons light pancake syrup  1 inch square of frosted cake or 2 inch square of unfrosted  cake  2 small cookies (2/3 ounce each) or  large cookie  Sometimes you'll have to estimate carbohydrate amounts if you don't know the exact recipe. One cup of mixed foods like soups can have 1 to 2 carbohydrate servings, while some casseroles might have 2 or more servings of carbohydrate. Foods that have less than 20 calories in each serving can be counted as "free" foods. Count 1 cup raw vegetables, or  cup cooked non-starchy vegetables as "free" foods. If you eat 3 or more servings at one meal, then count them as 1 carbohydrate serving.  Foods without Carbohydrates  Not all foods contain carbohydrates. Meat, some dairy, fats, non-starchy vegetables, and many beverages don't contain carbohydrate. So when you count carbohydrates, you can generally exclude chicken, pork, beef, fish, seafood, eggs, tofu, cheese, butter, sour cream, avocado, nuts, seeds, olives, mayonnaise, water, black coffee, unsweetened tea, and zero-calorie drinks. Vegetables with no or low carbohydrate include green beans, cauliflower, tomatoes, and onions. How much carbohydrate should I eat at each meal?  Carbohydrate counting can help you plan your meals and manage your weight. Following are some starting points for carbohydrate intake at each meal. Work with your registered dietitian nutritionist to find the best range that works for your blood glucose and weight.   To Lose Weight To Maintain Weight  Women 2 - 3 carb servings 3 - 4 carb servings  Men 3 - 4 carb servings 4 - 5 carb servings  Checking your   blood glucose after meals will help you know if you need to adjust the timing, type, or number of carbohydrate servings in your meal plan. Achieve and keep a healthy body weight by balancing your food intake and physical activity.  Tips How should I plan my meals?  Plan for half the food on your plate to include non-starchy vegetables, like salad greens, broccoli, or carrots. Try to eat 3 to 5 servings of non-starchy vegetables  every day. Have a protein food at each meal. Protein foods include chicken, fish, meat, eggs, or beans (note that beans contain carbohydrate). These two food groups (non-starchy vegetables and proteins) are low in carbohydrate. If you fill up your plate with these foods, you will eat less carbohydrate but still fill up your stomach. Try to limit your carbohydrate portion to  of the plate.  What fats are healthiest to eat?  Diabetes increases risk for heart disease. To help protect your heart, eat more healthy fats, such as olive oil, nuts, and avocado. Eat less saturated fats like butter, cream, and high-fat meats, like bacon and sausage. Avoid trans fats, which are in all foods that list "partially hydrogenated oil" as an ingredient. What should I drink?  Choose drinks that are not sweetened with sugar. The healthiest choices are water, carbonated or seltzer waters, and tea and coffee without added sugars.  Sweet drinks will make your blood glucose go up very quickly. One serving of soda or energy drink is  cup. It is best to drink these beverages only if your blood glucose is low.  Artificially sweetened, or diet drinks, typically do not increase your blood glucose if they have zero calories in them. Read labels of beverages, as some diet drinks do have carbohydrate and will raise your blood glucose. Label Reading Tips Read Nutrition Facts labels to find out how many grams of carbohydrate are in a food you want to eat. Don't forget: sometimes serving sizes on the label aren't the same as how much food you are going to eat, so you may need to calculate how much carbohydrate is in the food you are serving yourself.   Carbohydrate Counting for People with Diabetes Sample 1-Day Menu  Breakfast  cup yogurt, low fat, low sugar (1 carbohydrate serving)   cup cereal, ready-to-eat, unsweetened (1 carbohydrate serving)  1 cup strawberries (1 carbohydrate serving)   cup almonds ( carbohydrate serving)   Lunch 1, 5 ounce can chunk light tuna  2 ounces cheese, low fat cheddar  6 whole wheat crackers (1 carbohydrate serving)  1 small apple (1 carbohydrate servings)   cup carrots ( carbohydrate serving)   cup snap peas  1 cup 1% milk (1 carbohydrate serving)   Evening Meal Stir fry made with: 3 ounces chicken  1 cup brown rice (3 carbohydrate servings)   cup broccoli ( carbohydrate serving)   cup green beans   cup onions  1 tablespoon olive oil  2 tablespoons teriyaki sauce ( carbohydrate serving)  Evening Snack 1 extra small banana (1 carbohydrate serving)  1 tablespoon peanut butter   Carbohydrate Counting for People with Diabetes Vegan Sample 1-Day Menu  Breakfast 1 cup cooked oatmeal (2 carbohydrate servings)   cup blueberries (1 carbohydrate serving)  2 tablespoons flaxseeds  1 cup soymilk fortified with calcium and vitamin D  1 cup coffee  Lunch 2 slices whole wheat bread (2 carbohydrate servings)   cup baked tofu   cup lettuce  2 slices tomato  2 slices avocado     cup baby carrots ( carbohydrate serving)  1 orange (1 carbohydrate serving)  1 cup soymilk fortified with calcium and vitamin D   Evening Meal Burrito made with: 1 6-inch corn tortilla (1 carbohydrate serving)  1 cup refried vegetarian beans (2 carbohydrate servings)   cup chopped tomatoes   cup lettuce   cup salsa  1/3 cup brown rice (1 carbohydrate serving)  1 tablespoon olive oil for rice   cup zucchini   Evening Snack 6 small whole grain crackers (1 carbohydrate serving)  2 apricots ( carbohydrate serving)   cup unsalted peanuts ( carbohydrate serving)    Carbohydrate Counting for People with Diabetes Vegetarian (Lacto-Ovo) Sample 1-Day Menu  Breakfast 1 cup cooked oatmeal (2 carbohydrate servings)   cup blueberries (1 carbohydrate serving)  2 tablespoons flaxseeds  1 egg  1 cup 1% milk (1 carbohydrate serving)  1 cup coffee  Lunch 2 slices whole wheat bread (2 carbohydrate  servings)  2 ounces low-fat cheese   cup lettuce  2 slices tomato  2 slices avocado   cup baby carrots ( carbohydrate serving)  1 orange (1 carbohydrate serving)  1 cup unsweetened tea  Evening Meal Burrito made with: 1 6-inch corn tortilla (1 carbohydrate serving)   cup refried vegetarian beans (1 carbohydrate serving)   cup tomatoes   cup lettuce   cup salsa  1/3 cup brown rice (1 carbohydrate serving)  1 tablespoon olive oil for rice   cup zucchini  1 cup 1% milk (1 carbohydrate serving)  Evening Snack 6 small whole grain crackers (1 carbohydrate serving)  2 apricots ( carbohydrate serving)   cup unsalted peanuts ( carbohydrate serving)    Copyright 2020  Academy of [employer] and Dietetics. All rights reserved.  Using Nutrition Labels: Carbohydrate  Serving Size  Look at the serving size. All the information on the label is based on this portion. Servings Per Container  The number of servings contained in the package. Guidelines for Carbohydrate  Look at the total grams of carbohydrate in the serving size.  1 carbohydrate choice = 15 grams of carbohydrate. Range of Carbohydrate Grams Per Choice  Carbohydrate Grams/Choice Carbohydrate Choices  6-10   11-20 1  21-25 1  26-35 2  36-40 2  41-50 3  51-55 3  56-65 4  66-70 4  71-80 5    Copyright 2020  Academy of Nutrition and Dietetics. All rights reserved.   Additional Discharge Instructions   Please get your medications reviewed and adjusted by your Primary MD.  Please request your Primary MD to go over all Hospital Tests and Procedure/Radiological results at the follow up, please get all Hospital records sent to your Prim MD by signing hospital release before you go home.  If you had Pneumonia of Lung problems at the Hospital: Please get a 2 view Chest X ray done in approximately 4 weeks after hospital discharge or sooner if instructed by your Primary MD.  If you have Congestive Heart  Failure: Please call your Cardiologist or Primary MD anytime you have any of the following symptoms:  1) 3 pound weight gain in 24 hours or 5 pounds in 1 week  2) shortness of breath, with or without a dry hacking cough  3) swelling in the hands, feet or stomach  4) if you have to sleep on extra pillows at night in order to breathe  Follow cardiac low salt diet and 1.5 lit/day fluid restriction.  If you have diabetes Accuchecks 4 times/day, Once   in AM empty stomach and then before each meal. Log in all results and show them to your primary doctor at your next visit. If any glucose reading is under 80 or above 300 call your primary MD immediately.  If you have Seizure/Convulsions/Epilepsy: Please do not drive, operate heavy machinery, participate in activities at heights or participate in high speed sports until you have seen by Primary MD or a Neurologist and advised to do so again. Per [place] DMV statutes, patients with seizures are not allowed to drive until they have been seizure-free for six months.  Use caution when using heavy equipment or power tools. Avoid working on ladders or at heights. Take showers instead of baths. Ensure the water temperature is not too high on the home water heater. Do not go swimming alone. Do not lock yourself in a room alone (i.e. bathroom). When caring for infants or small children, sit down when holding, feeding, or changing them to minimize risk of injury to the child in the event you have a seizure. Maintain good sleep hygiene. Avoid alcohol.   If you had Gastrointestinal Bleeding: Please ask your Primary MD to check a complete blood count within one week of discharge or at your next visit. Your endoscopic/colonoscopic biopsies that are pending at the time of discharge, will also need to followed by your Primary MD.  Get Medicines reviewed and adjusted. Please take all your medications with you for your next visit with your Primary MD  Please  request your Primary MD to go over all hospital tests and procedure/radiological results at the follow up, please ask your Primary MD to get all Hospital records sent to his/her office.  If you experience worsening of your admission symptoms, develop shortness of breath, life threatening emergency, suicidal or homicidal thoughts you must seek medical attention immediately by calling 911 or calling your MD immediately  if symptoms less severe.  You must read complete instructions/literature along with all the possible adverse reactions/side effects for all the Medicines you take and that have been prescribed to you. Take any new Medicines after you have completely understood and accpet all the possible adverse reactions/side effects.   Do not drive or operate heavy machinery when taking Pain medications.   Do not take more than prescribed Pain, Sleep and Anxiety Medications  Special Instructions: If you have smoked or chewed Tobacco  in the last 2 yrs please stop smoking, stop any regular Alcohol  and or any Recreational drug use.  Wear Seat belts while driving.  Please note You were cared for by a hospitalist during your hospital stay. If you have any questions about your discharge medications or the care you received while you were in the hospital after you are discharged, you can call the unit and asked to speak with the hospitalist on call if the hospitalist that took care of you is not available. Once you are discharged, your primary care physician will handle any further medical issues. Please note that NO REFILLS for any discharge medications will be authorized once you are discharged, as it is imperative that you return to your primary care physician (or establish a relationship with a primary care physician if you do not have one) for your aftercare needs so that they can reassess your need for medications and monitor your lab values.  You can reach the hospitalist office at phone  [phone] or fax [phone]   If you do not have a primary care physician, you can call [phone]   for a physician referral.  

## 2022-07-22 NOTE — Discharge Summary (Signed)
Physician Discharge Summary  Stephanie Reid:[id number] DOB: 1991-01-07  PCP: Pcp, No.  Now will follow-up with Dr. Linward Natal at the [facility]  Admitted from: Home Discharged to: Home  Admit date: 07/21/2022 Discharge date: 07/22/2022  Recommendations for Outpatient Follow-up:    [place]. Go on 07/31/2022.   Why: Your hospital follow up appointment is scheduled for Wednesday July 31, 2022 at 3:30pm. You will see Dr. Linward Natal.  To be seen with repeat labs (CBC & BMP).  [facility] follow-up on the A1c results that were sent from the hospital on 07/21/2022. Contact information: [address] [place]: None    Equipment/Devices: None    Discharge Condition: Improved and stable.   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  Presented with blood glucose of 699.  Admitted for newly diagnosed type II DM without DKA.  Briefly on insulin drip, quickly transitioned to subcutaneous insulins.  Adjusting insulins.     Assessment & Plan:    New onset type II DM: Strong family history of DM and  patient's father and grandmother.  1 month history of polyuria, polydipsia, unintentional 20 pound weight loss and blurred vision.  Presented to the ED with blood glucose of 699 mg per DL.  Briefly placed on insulin drip, required approximately 33 units and CBG dropped to 264 mg per DL and insulin drip was discontinued.  Diabetic diet.  Started Semglee 10 units nightly, SSI and consider adding mealtime NovoLog.  A1c pending at time of discharge then can be followed up as outpatient, suspect will be >10 needing insulins at discharge.  Consulted TOC for PCP and medication needs.  Consulted diabetes coordinator for education of all aspects of DM care including insulin management.  Dietitian consultation.  Fasting blood glucose 392 on BMP.  Changed SSI to moderate sensitivity, added mealtime NovoLog 4 units 3 times daily and increased Semglee to 20 units nightly which she got a little earlier today.  Although CBG this morning at 373, this is expected to improve with above measures.  TOC has arranged a new PCP appointment for patient as noted above.  Registered dietitian has seen and counseled patient regarding her diet.  DM coordinator input appreciated and recommends Novolin 70/30 insulin, 15 units twice daily to start tomorrow since she has received Semglee today.  This insulin was opted (rather than the current one she is getting in the hospital) because she can afford it at the [facility].  She has been extensively educated regarding  all aspects of management of her DM.  Close outpatient follow-up and titration of insulins as deemed necessary.  Recommend outpatient ophthalmology consultation which can be arranged through her PCP.  Her blurred vision on admission was likely due to marked hyperglycemia.  However on initial exam in ED, also felt to have immature cataracts at this young age.   Dehydration with hyponatremia: Resolved after IV fluids.   Vaginal candidiasis: Patient wanted to be tested for  STDs.  Requested GC and Chlamydia probe-results pending and results can be followed up as outpatient and treated as deemed necessary.  Treated with a dose of fluconazole.  Pregnancy test negative.  Reports unprotected sexual intercourse.  Vaginal exam deferred.  HIV screen nonreactive.   Body mass index is 33.28 kg/m.  Obesity: Has been counseled regarding lifestyle modifications and weight loss.   Tobacco and alcohol use: Cessation counseled.     Consultations: None  Procedures: None   Discharge Instructions  Discharge Instructions     Amb Referral to Nutrition and Diabetic Education   Complete by: As directed    Diet - low sodium heart healthy   Complete by: As directed    Increase activity slowly   Complete by: As directed         Medication List     STOP taking these medications    fluconazole 150 MG tablet Commonly known as: Diflucan   nystatin 100000 UNIT/ML suspension Commonly known as: MYCOSTATIN   valACYclovir 1000 MG tablet Commonly known as: VALTREX       TAKE these medications    blood glucose meter kit and supplies Dispense based on patient and insurance preference. Use up to four times daily as directed. (FOR ICD-10 E10.9, E11.9).   NovoLIN 70/30 Kwikpen (70-30) 100 UNIT/ML KwikPen Generic drug: insulin isophane & regular human KwikPen Inject 15 Units into the skin 2 (two) times daily before a meal. Start taking on: July 23, 2022   Pen Needles 3/16" 31G X 5 MM Misc Use twice daily as per instructions.       Allergies  Allergen Reactions   Apple Juice Anaphylaxis   Banana Anaphylaxis   Orange Fruit [Citrus] Anaphylaxis   Lactase-Lactobacillus Other (See Comments)    hemmorrhoids   Spinach Diarrhea   Vicodin [Hydrocodone-Acetaminophen] Nausea And Vomiting   Penicillin G      GI Upset (intolerance) Did it involve swelling of the face/tongue/throat, SOB, or low BP? No Did it involve sudden or severe rash/hives, skin peeling, or  any reaction on the inside of your mouth or nose? No Did you need to seek medical attention at a hospital or doctor's office? No When did it last happen?  not recent If all above answers are "NO", may proceed with cephalosporin use.       Procedures/Studies: No results found.    Subjective: Had some headache this morning.  Otherwise feels much better with no further polyuria or polydipsia or blurred vision.  Discharge Exam:  Vitals:   07/22/22 0108 07/22/22 0501 07/22/22 0934 07/22/22 1335  BP: 122/83 112/74 115/80 113/72  Pulse: 62 (!) 55 65 (!) 53  Resp: 18 18 14 16   Temp: 98.1 F (36.7 C) 97.9 F (36.6 C) 97.8 F (36.6 C) 97.9 F (36.6 C)  TempSrc: Oral Oral  Oral  SpO2: 99% 98% 100% 99%  Weight:      Height:        General exam: Pleasant young female, moderately built and obese lying comfortably propped up  in bed without distress.  Oral mucosa moist. Respiratory system: Clear to auscultation. Respiratory effort normal. Cardiovascular system: S1 & S2 heard, RRR. No JVD, murmurs, rubs, gallops or clicks. No pedal edema. Gastrointestinal system: Abdomen is nondistended, soft and nontender. No organomegaly or masses felt. Normal bowel sounds heard. Central nervous system: Alert and oriented. No focal neurological deficits. Extremities: Symmetric 5 x 5 power. Skin: No rashes, lesions or ulcers Psychiatry: Judgement and insight appear normal. Mood & affect appropriate.     The results of significant diagnostics from this hospitalization (including imaging, microbiology, ancillary and laboratory) are listed below for reference.     Microbiology: No results found for this or any previous visit (from the past 240 hour(s)).   Labs: CBC: Recent Labs  Lab 07/21/22 1252 07/22/22 0329  WBC 6.4 6.9  HGB 13.7 12.1  HCT 40.8 35.9*  MCV 89.5 90.0  PLT 206 458    Basic Metabolic Panel: Recent Labs  Lab 07/21/22 1252 07/22/22 0329  NA 129* 135  K 4.2 3.7  CL  94* 103  CO2 26 26  GLUCOSE 699* 392*  BUN 8 9  CREATININE 0.89 0.70  CALCIUM 9.3 8.6*    Liver Function Tests: Recent Labs  Lab 07/21/22 1252  AST 30  ALT 32  ALKPHOS 105  BILITOT 0.6  PROT 8.1  ALBUMIN 3.9    CBG: Recent Labs  Lab 07/21/22 1525 07/21/22 1736 07/21/22 2141 07/22/22 0753 07/22/22 1146  GLUCAP 264* 240* 359* 330* 373*    Thyroid function studies Recent Labs    07/21/22 1645  TSH 1.732    Urinalysis    Component Value Date/Time   COLORURINE COLORLESS (A) 07/21/2022 1310   APPEARANCEUR CLEAR 07/21/2022 1310   LABSPEC 1.030 07/21/2022 1310   PHURINE 5.0 07/21/2022 1310   GLUCOSEU >=500 (A) 07/21/2022 1310   HGBUR NEGATIVE 07/21/2022 1310   BILIRUBINUR NEGATIVE 07/21/2022 1310   BILIRUBINUR negative 07/21/2022 1149   KETONESUR NEGATIVE 07/21/2022 1310   KETONESUR negative 07/21/2022 1149   PROTEINUR NEGATIVE 07/21/2022 1310   PROTEINUR negative 07/21/2022 1149   UROBILINOGEN 0.2 07/21/2022 1149   UROBILINOGEN 0.2 02/09/2020 0920   NITRITE NEGATIVE 07/21/2022 1310   NITRITE Negative 07/21/2022 1149   LEUKOCYTESUR NEGATIVE 07/21/2022 1310   LEUKOCYTESUR Negative 07/21/2022 1149      Time coordinating discharge: 25 minutes  SIGNED:  Vernell Leep, MD,  FACP, [facility], [facility], [facility] (Care Management Physician Certified). [facility]  To contact the attending provider between 7A-7P or the covering provider during after hours 7P-7A, please log into the web site [email or web address] and access using universal [place] password for that web site. If you do not have the password, please call the hospital operator.

## 2022-07-22 NOTE — Plan of Care (Signed)
Problem: Education: Goal: Knowledge of General Education information will improve Description: Including pain rating scale, medication(s)/side effects and non-pharmacologic comfort measures Outcome: Progressing   Problem: Health Behavior/Discharge Planning: Goal: Ability to manage health-related needs will improve Outcome: Progressing   Problem: Clinical Measurements: Goal: Ability to maintain clinical measurements within normal limits will improve Outcome: Progressing   Haydee Salter, RN 07/22/22 9:11 AM

## 2022-07-22 NOTE — Plan of Care (Signed)
Patient discharging with cousin via private vehicle. Haydee Salter, RN 07/22/22 3:08 PM

## 2022-07-22 NOTE — Progress Notes (Signed)
PROGRESS NOTE   Stephanie Reid  JIR:[id number]    DOB: 1991/07/31    DOA: 07/21/2022  PCP: Pcp, No   I have briefly reviewed patients previous medical records in [facility].  Chief Complaint  Patient presents with   Hyperglycemia    Brief Narrative:   31 year old female, lives with her cousin, works as a Equities trader at a [employer], PMH of STDs (bacterial vaginosis, chlamydia, gonorrhea, trichomoniasis) but otherwise no other PMH, presented to the [facility] ED on 07/21/2022 with 1 month history of polyuria, polydipsia, blurred vision, decreased appetite, unintentional 20 pound weight loss in 1 week history of vaginal pruritus.  Presented with blood glucose of 699.  Admitted for newly diagnosed type II DM without DKA.  Briefly on insulin drip, quickly transitioned to subcutaneous insulins.  Adjusting insulins.   Assessment & Plan:  Principal Problem:   New onset type 2 diabetes mellitus (HCC) Active Problems:   Dehydration   Vaginal candidiasis   New onset type II DM: Strong family history of DM and patient's father and grandmother.  1 month history of polyuria, polydipsia, unintentional 20 pound weight loss and blurred vision.  Presented to the ED with blood glucose of 699 mg per DL.  Briefly placed on insulin drip, required approximately 33 units and CBG dropped to 264 mg per DL and insulin drip was discontinued before initiation of subcutaneous insulins.  Diabetic diet.  Started Semglee 10 units nightly, SSI and consider adding mealtime NovoLog.  Will need extensive DM education.  A1c pending and suspect will be >10 needing some form of insulins at discharge.  Consulted TOC for PCP and medication needs.  Consulted diabetes coordinator for education of all aspects of DM care including insulin management.  Dietitian consultation.  Fasting blood glucose 392.  Changed SSI to moderate sensitivity, added mealtime NovoLog 4 units 3 times daily.  Will  need further titration of Semglee.  Await diabetes coordinator input to determine choice of meds at discharge.   Dehydration with hyponatremia: Some of the hyponatremia is pseudohyponatremia from hyperglycemia.  Improved after IV fluids.  DC IV fluids.   Vaginal candidiasis: Patient wants to be tested for STDs.  Requested GC and Chlamydia probe-results pending.  Treated with a dose of fluconazole.  Pregnancy test negative.  Reports unprotected sexual intercourse.  Vaginal exam deferred.   Body mass index is 33.28 kg/m.  Obesity: Will need lifestyle modifications and weight loss.   Tobacco and alcohol use: Cessation counseled.  Body mass index is 33.28 kg/m.    DVT prophylaxis: enoxaparin (LOVENOX) injection 40 mg Start: 07/21/22 2200     Code Status: Full Code:  Family Communication: None at bedside Disposition:  Status is: Observation The patient remains OBS appropriate and will d/c before 2 midnights.   Pending all consults as noted above and better control of DM, possible discharge either later today or tomorrow.  Consultants:     Procedures:     Antimicrobials:      Subjective:  Headache this morning.  No further polyuria or polydipsia.  States that she usually takes someone else's ibuprofen 800 mg when she has headaches.  Objective:   Vitals:   07/21/22 2022 07/21/22 2136 07/22/22 0108 07/22/22 0501  BP: 110/61 111/65 122/83 112/74  Pulse: 68 71 62 (!) 55  Resp: 16 18 18 18   Temp: 97.8 F (36.6 C) 97.9 F (36.6 C) 98.1 F (36.7 C) 97.9 F (36.6 C)  TempSrc: Oral Oral Oral  Oral  SpO2: 100% 96% 99% 98%  Weight:      Height:        General exam: Pleasant young female, moderately built and obese lying comfortably propped up in bed without distress.  Oral mucosa moist. Respiratory system: Clear to auscultation. Respiratory effort normal. Cardiovascular system: S1 & S2 heard, RRR. No JVD, murmurs, rubs, gallops or clicks. No pedal edema. Gastrointestinal  system: Abdomen is nondistended, soft and nontender. No organomegaly or masses felt. Normal bowel sounds heard. Central nervous system: Alert and oriented. No focal neurological deficits. Extremities: Symmetric 5 x 5 power. Skin: No rashes, lesions or ulcers Psychiatry: Judgement and insight appear normal. Mood & affect appropriate.     Data Reviewed:   I have personally reviewed following labs and imaging studies   CBC: Recent Labs  Lab 07/21/22 1252 07/22/22 0329  WBC 6.4 6.9  HGB 13.7 12.1  HCT 40.8 35.9*  MCV 89.5 90.0  PLT 206 169    Basic Metabolic Panel: Recent Labs  Lab 07/21/22 1252 07/22/22 0329  NA 129* 135  K 4.2 3.7  CL 94* 103  CO2 26 26  GLUCOSE 699* 392*  BUN 8 9  CREATININE 0.89 0.70  CALCIUM 9.3 8.6*    Liver Function Tests: Recent Labs  Lab 07/21/22 1252  AST 30  ALT 32  ALKPHOS 105  BILITOT 0.6  PROT 8.1  ALBUMIN 3.9    CBG: Recent Labs  Lab 07/21/22 1525 07/21/22 1736 07/21/22 2141  GLUCAP 264* 240* 359*    Microbiology Studies:  No results found for this or any previous visit (from the past 240 hour(s)).  Radiology Studies:  No results found.  Scheduled Meds:    enoxaparin (LOVENOX) injection  40 mg Subcutaneous Q24H   fluconazole  150 mg Oral Once   insulin aspart  0-15 Units Subcutaneous TID WC   insulin aspart  0-5 Units Subcutaneous QHS   insulin aspart  4 Units Subcutaneous TID WC   insulin glargine-yfgn  10 Units Subcutaneous QHS    Continuous Infusions:    lactated ringers 150 mL/hr at 07/22/22 0435     LOS: 0 days     Marcellus Scott, MD,  FACP, [facility], [facility], [facility] (Care Management Physician Certified) [facility] [place]  To contact the attending provider between 7A-7P or the covering provider during after hours 7P-7A, please log into the web site [email or web address] and access using universal [place] password for that web site. If you do not have the password, please call  the hospital operator.  07/22/2022, 7:29 AM

## 2022-07-22 NOTE — Inpatient Diabetes Management (Signed)
Inpatient Diabetes Program Recommendations  AACE/ADA: New Consensus Statement on Inpatient Glycemic Control (2015)  Target Ranges:  Prepandial:   less than 140 mg/dL      Peak postprandial:   less than 180 mg/dL (1-2 hours)      Critically ill patients:  140 - 180 mg/dL   Lab Results  Component Value Date   GLUCAP 330 (H) 07/22/2022    Review of Glycemic Control  Diabetes history: New onset DM2 Outpatient Diabetes medications: None Current orders for Inpatient glycemic control: Semglee 20 QD, Novolog 0-15 TID with meals and 0-5 HS + 4 units TID  HgbA1C - pending  Inpatient Diabetes Program Recommendations:    Spoke with pt at bedside regarding new diagnosis of diabetes. Pt states she's had symptoms of lethargy, thirst, blurred vision "for awhile now." Does not have PCP. States she doesn't go to doctor unless she needs physical, then goes to Urgent Care. Has family hx DM.  Discussed basic pathophysiology of DM Type 2, basic home care, importance of checking CBGs and maintaining good CBG control to prevent long-term and short-term complications. Reviewed glucose and A1C goals, although results aren't back yet. Spoke with patient about new diabetes diagnosis.  Discussed A1C results () and explained what an A1C is and informed patient that his current A1C indicates an average glucose of 240 mg/dl over the past 2-3 months. Discussed basic pathophysiology of DM Type 2, basic home care, importance of checking CBGs and maintaining good CBG control to prevent long-term and short-term complications. Reviewed glucose and A1C goals. Reviewed signs and symptoms of hyperglycemia and hypoglycemia along with treatment for both. Discussed impact of nutrition, exercise, stress, sickness, and medications on diabetes control. Ordered Living Well with diabetes booklet and encouraged patient to read through entire book. Informed patient that he will be prescribed Novolin 70/30 since it is more affordable. Informed  patient that Novolin 70/30 can be purchased at [facility] for $44 for 5 pens. Encouraged patient to go to [facility] to get the Reli-On Prime glucometer and strips. Discussed 70/30 insulin in detail (how to take it, when to take it)  Asked patient to check his glucose 4 times per day (before meals and at bedtime) and to keep a log book of glucose readings and insulin taken. Explained how the doctor he follows up with can use the log book to continue to make insulin adjustments if needed. Informed patient that RN will be asking him to self-administer insulin to ensure proper technique and ability to administer self insulin shots.  Patient verbalized understanding of information discussed and states that she has no further questions at this time related to diabetes.   RNs to provide ongoing basic DM education at bedside with this patient and engage patient to actively check blood glucose and administer insulin injections.  Educated patient on insulin pen use at home. Reviewed contents of insulin flexpen starter kit. Reviewed all steps if insulin pen including attachment of needle, 2-unit air shot, dialing up dose, giving injection, removing needle, disposal of sharps, storage of unused insulin, disposal of insulin etc. Patient able to provide successful return demonstration. Also reviewed troubleshooting with insulin pen. MD to give patient Rxs for insulin pens and insulin pen needles.  Pt works 2nd shift. Will need to take 70/30 prior to eating first meal after waking up (mid-afternoon) and take 2nd dose at work before dinner meal (9-10 pm).  70/30 15 units BID before meals.  Will need prescriptions for: Novolin ReliOn 70/30  flexpen (#[id number])  Insulin pen needles (#[id number]) Blood glucose meter kit (#[id number])  Discussed above with RN.  Thank you. Lorenda Peck, RD, LDN, [place] Inpatient Diabetes Coordinator [phone]

## 2022-07-23 ENCOUNTER — Telehealth (HOSPITAL_COMMUNITY): Payer: Self-pay | Admitting: Emergency Medicine

## 2022-07-23 LAB — HEMOGLOBIN A1C
Hgb A1c MFr Bld: >15.5 % — ABNORMAL HIGH (ref 4.8–5.6)
Mean Plasma Glucose: >398 mg/dL

## 2022-07-23 MED ORDER — METRONIDAZOLE 0.75 % VA GEL: 1.0000 | Freq: Every day | VAGINAL | 0 refills | Status: AC

## 2022-07-31 ENCOUNTER — Ambulatory Visit: Payer: Self-pay

## 2022-07-31 ENCOUNTER — Other Ambulatory Visit: Payer: Self-pay

## 2022-07-31 VITALS — BP 113/71 | HR 79 | Temp 98.4°F | Resp 28 | Ht 65.0 in | Wt 198.9 lb

## 2022-07-31 DIAGNOSIS — E1165 Type 2 diabetes mellitus with hyperglycemia: Secondary | ICD-10-CM

## 2022-07-31 DIAGNOSIS — B3731 Acute candidiasis of vulva and vagina: Secondary | ICD-10-CM

## 2022-07-31 DIAGNOSIS — E119 Type 2 diabetes mellitus without complications: Secondary | ICD-10-CM

## 2022-07-31 MED ORDER — NOVOLIN 70/30 FLEXPEN RELION (70-30) 100 UNIT/ML ~~LOC~~ SUPN
20.0000 [IU] | PEN_INJECTOR | Freq: Two times a day (BID) | SUBCUTANEOUS | 0 refills | Status: DC
Start: 2022-07-31 — End: 2023-01-08

## 2022-07-31 MED ORDER — METFORMIN HCL 500 MG PO TABS: ORAL_TABLET | ORAL | 11 refills | Status: DC

## 2022-07-31 NOTE — Patient Instructions (Signed)
[name], it was a pleasure seeing you today!  Today we discussed: T2 Diabetes: Your blood sugar remains elevated. Will increase Novolin to 20 u twice a day.   Will add metformin to be taken as follows: Week 1: 1 tablet with breakfast Week 2: 1 tablet with breakfast and 1 tablet with dinner Week 3: 2 tablets with breakfast and 1 tablet with dinner Week 4: 2 tablets with breakfast and 2 tablets with dinner  I have ordered the following labs today:  Lab Orders  No laboratory test(s) ordered today     Tests ordered today:  None  Referrals ordered today:   Referral Orders  No referral(s) requested today     I have ordered the following medication/changed the following medications:   Stop the following medications: There are no discontinued medications.   Start the following medications: No orders of the defined types were placed in this encounter.    Follow-up:  2-4 weeks    Please make sure to arrive 15 minutes prior to your next appointment. If you arrive late, you may be asked to reschedule.   We look forward to seeing you next time. Please call our clinic at [phone] if you have any questions or concerns. The best time to call is Monday-Friday from 9am-4pm, but there is someone available 24/7. If after hours or the weekend, call the main hospital number and ask for the Internal Medicine Resident On-Call. If you need medication refills, please notify your pharmacy one week in advance and they will send [place] a request.  Thank you for letting us take part in your care. Wishing you the best!  Thank you, Adron Bene, MD

## 2022-07-31 NOTE — Progress Notes (Signed)
   CC: Hospital follow-up, establish care  HPI:  [name] is a 31 y.o. with past medical history as below who presents for hospital follow-up and to establish care.  Past Medical History:  Diagnosis Date   Bacterial vaginosis    Herpes simplex    Hx of chlamydia infection    Hx of gonorrhea    Hx of trichomoniasis    Seasonal allergies    Review of Systems: Pertinent ROS included in detailed assessment and plan.  Physical Exam:  Vitals:   07/31/22 1551  BP: 113/71  Pulse: 79  Resp: (!) 28  Temp: 98.4 F (36.9 C)  TempSrc: Oral  SpO2: 97%  Weight: 198 lb 14.4 oz (90.2 kg)  Height: 5\' 5"  (1.651 m)   Physical Exam Constitutional:      General: She is not in acute distress. Eyes:     Extraocular Movements: Extraocular movements intact.  Cardiovascular:     Rate and Rhythm: Normal rate and regular rhythm.  Pulmonary:     Effort: Pulmonary effort is normal.     Breath sounds: Normal breath sounds.  Skin:    General: Skin is warm and dry.  Neurological:     Mental Status: She is alert and oriented to person, place, and time.  Psychiatric:        Mood and Affect: Mood normal.      Assessment & Plan:   See Encounters Tab for problem based charting.  Vaginal candidiasis Patient had vaginal swab with bacterial vaginitis and candida vaginitis and glabrata. Had reported vaginal itching. Got one dose fluconazole. Symptoms have since resolved.   -Will not treat BV as symptoms have resolved.  New onset type 2 diabetes mellitus (HCC) Discharged 8/27 after admission for new onset diabetes, likely type 2, with severe hyperglycemia. A1c 15.5 at that time. Discharged on Novolin 70/30 15 u BID. Sugars continue in the 300s on average with home glucose monitor. Polyuria and polydipsia have resolved but headaches and vision changes continue to be an issue. Interested in glucose monitor. Working on Stephanie Reid through work or orange card.  -increase Novolin  70/30 to 20 u BID -Start metformin dose ramp to total of 1000 mg BID -F/u in 2-4 weeks with diabetes education    Patient seen with Dr. Museum/gallery curator

## 2022-07-31 NOTE — Assessment & Plan Note (Addendum)
Discharged 8/27 after admission for new onset diabetes, likely type 2, with severe hyperglycemia and history of significant weight loss. A1c 15.5 at that time. Discharged on Novolin 70/30 15 u BID. Sugars continue in the 300s - 400s on average with home glucose monitor. Polyuria and polydipsia have resolved but headaches and vision changes continue to be an issue. Interested in glucose monitor. Working on Museum/gallery curator through work or orange card.  -increase Novolin 70/30 to 20 u BID -Start metformin dose ramp to total of 1000 mg BID -F/u in 2-4 weeks with diabetes education -Referral to ophthalmology once insurance obtained

## 2022-07-31 NOTE — Assessment & Plan Note (Addendum)
Patient had vaginal swab with bacterial vaginitis and candida vaginitis and glabrata during recent admission. Had reported vaginal itching. Got one dose fluconazole. Symptoms have since resolved.   -Will not treat BV as symptoms have resolved.

## 2022-08-01 NOTE — Progress Notes (Signed)
Internal Medicine Clinic Attending  I saw and evaluated the patient.  I personally confirmed the key portions of the history and exam documented by Dr. Cliffton Asters and I reviewed pertinent patient test results.  The assessment, diagnosis, and plan were formulated together and I agree with the documentation in the resident's note.   New diagnosis of diabetes without ketoacidosis and currently without insurance. Goal in the coming months will be to transition away from an insulin regimen to other diabetes agents. She is going to work on insurance during the upcoming open enrollment period at her work, which will give [place] more options. She is a great candidate for CGM when it can be accessed. For now we will add metformin and increase the mixed insulin. I suspect this is most likely type 2 diabetes.

## 2022-08-28 ENCOUNTER — Ambulatory Visit: Payer: Self-pay | Admitting: Dietician

## 2022-11-21 ENCOUNTER — Ambulatory Visit
Admission: EM | Admit: 2022-11-21 | Discharge: 2022-11-21 | Disposition: A | Discharge status: Home / Self Care | Payer: Self-pay | Attending: Family Medicine | Admitting: Family Medicine

## 2022-11-21 DIAGNOSIS — B3749 Other urogenital candidiasis: Secondary | ICD-10-CM

## 2022-11-21 MED ORDER — FLUCONAZOLE 150 MG PO TABS: 150.0000 mg | ORAL_TABLET | ORAL | 0 refills | Status: AC

## 2022-11-21 NOTE — ED Provider Notes (Signed)
[facility]    CSN: [id number] Arrival date & time: 11/21/22  0836      History   Chief Complaint Chief Complaint  Patient presents with   Vaginal Itching    HPI Stephanie Reid is a 31 y.o. female.    Vaginal Itching   Here for some perineal itching and irritation.  Is been going on about 3 days.  No discharge.  She has done some over-the-counter Monistat which helped a little.  No urinary frequency or dysuria.  No fever or chills and no abdominal pain or vomiting  She has been diagnosed with diabetes and since August has been using insulin.  She does not check her sugars as she has run out of test trips.  She did see an internist once right after her hospitalization in August, but has not been back.    Past Medical History:  Diagnosis Date   Bacterial vaginosis    Herpes simplex    Hx of chlamydia infection    Hx of gonorrhea    Hx of trichomoniasis    Seasonal allergies     Patient Active Problem List   Diagnosis Date Noted   New onset type 2 diabetes mellitus (HCC) 07/21/2022   Dehydration 07/21/2022   Vaginal candidiasis 07/21/2022   Amenorrhea, secondary 10/06/2013    Past Surgical History:  Procedure Laterality Date   NO PAST SURGERIES      OB History     Gravida  0   Para  0   Term  0   Preterm  0   AB  0   Living  0      SAB  0   IAB  0   Ectopic  0   Multiple  0   Live Births  0            Home Medications    Prior to Admission medications   Medication Sig Start Date End Date Taking? Authorizing Provider  fluconazole (DIFLUCAN) 150 MG tablet Take 1 tablet (150 mg total) by mouth every 3 (three) days for 2 doses. 11/21/22 11/25/22 Yes Banister, Pamela K, MD  blood glucose meter kit and supplies Dispense based on patient and insurance preference. Use up to four times daily as directed. (FOR ICD-10 E10.9, E11.9). 07/22/22   Hongalgi, Anand D, MD  insulin isophane & regular human KwikPen (NOVOLIN 70/30  KWIKPEN) (70-30) 100 UNIT/ML KwikPen Inject 20 Units into the skin 2 (two) times daily before a meal. 07/31/22   White, Jonathan, MD  Insulin Pen Needle (PEN NEEDLES 3/16") 31G X 5 MM MISC Use twice daily as per instructions. 07/22/22   Hongalgi, Anand D, MD  metFORMIN (GLUCOPHAGE) 500 MG tablet Take 1 tablet (500 mg total) by mouth daily with breakfast for 7 days, THEN 1 tablet (500 mg total) 2 (two) times daily with a meal for 7 days, THEN 1 tablet (500 mg total) in the morning, at noon, and at bedtime for 7 days, THEN 2 tablets (1,000 mg total) 2 (two) times daily with a meal. 07/31/22 09/20/22  White, Jonathan, MD  promethazine (PHENERGAN) 12.5 MG tablet Take 1 tablet (12.5 mg total) by mouth every 6 (six) hours as needed for nausea or vomiting. 12/09/20 02/06/21  Dykstra, Richard S, MD    Family History Family History  Problem Relation Age of Onset   Hypertension Mother    Heart disease Mother    Stroke Mother    Diabetes Maternal Grandmother       Social History Social History   Tobacco Use   Smoking status: Every Day    Packs/day: 0.10    Types: Cigarettes   Smokeless tobacco: Never   Tobacco comments:    2 cigs per day   Vaping Use   Vaping Use: Never used  Substance Use Topics   Alcohol use: Yes    Comment: on weekends   Drug use: Yes    Types: Marijuana     Allergies   Apple juice, Banana, Orange fruit [citrus], Lactase-lactobacillus, Spinach, Vicodin [hydrocodone-acetaminophen], and Penicillin g   Review of Systems Review of Systems   Physical Exam Triage Vital Signs ED Triage Vitals  Enc Vitals Group     BP 11/21/22 0935 110/76     Pulse Rate 11/21/22 0935 73     Resp 11/21/22 0935 18     Temp 11/21/22 0935 97.9 F (36.6 C)     Temp Source 11/21/22 0935 Oral     SpO2 11/21/22 0935 96 %     Weight --      Height --      Head Circumference --      Peak Flow --      Pain Score 11/21/22 0937 0     Pain Loc --      Pain Edu? --      Excl. in [place]? --    No  data found.  Updated Vital Signs BP 110/76 (BP Location: Right Arm)   Pulse 73   Temp 97.9 F (36.6 C) (Oral)   Resp 18   LMP 12/04/2009   SpO2 96%   Visual Acuity Right Eye Distance:   Left Eye Distance:   Bilateral Distance:    Right Eye Near:   Left Eye Near:    Bilateral Near:     Physical Exam Vitals reviewed.  Constitutional:      General: She is not in acute distress.    Appearance: She is not ill-appearing, toxic-appearing or diaphoretic.  HENT:     Mouth/Throat:     Mouth: Mucous membranes are moist.  Cardiovascular:     Rate and Rhythm: Normal rate and regular rhythm.     Heart sounds: No murmur heard. Pulmonary:     Effort: Pulmonary effort is normal.     Breath sounds: Normal breath sounds.  Skin:    Coloration: Skin is not pale.  Neurological:     Mental Status: She is oriented to person, place, and time.  Psychiatric:        Behavior: Behavior normal.      UC Treatments / Results  Labs (all labs ordered are listed, but only abnormal results are displayed) Labs Reviewed - No data to display  EKG   Radiology No results found.  Procedures Procedures (including critical care time)  Medications Ordered in UC Medications - No data to display  Initial Impression / Assessment and Plan / UC Course  I have reviewed the triage vital signs and the nursing notes.  Pertinent labs & imaging results that were available during my care of the patient were reviewed by me and considered in my medical decision making (see chart for details).        Since she had a little bit of improvement with the Monistat I will treat empirically with fluconazole.  Staff has made her a new PCP appointment for next week.  Encouragement is given to get some more test strips and check her sugar some at home.  She states that  she still has insulin to use at home Final Clinical Impressions(s) / UC Diagnoses   Final diagnoses:  Candidiasis of perineum     Discharge  Instructions      Take fluconazole 150 mg--1 tablet every 3 days for 2 doses  Keep your appointment for next week with a new primary care provider  It would be best to check your sugar some, since you are using insulin for your diabetes     ED Prescriptions     Medication Sig Dispense Auth. Provider   fluconazole (DIFLUCAN) 150 MG tablet Take 1 tablet (150 mg total) by mouth every 3 (three) days for 2 doses. 2 tablet Banister, Pamela K, MD      PDMP not reviewed this encounter.   Banister, Pamela K, MD 11/21/22 1006  

## 2022-11-21 NOTE — Discharge Instructions (Signed)
Take fluconazole 150 mg--1 tablet every 3 days for 2 doses  Keep your appointment for next week with a new primary care provider  It would be best to check your sugar some, since you are using insulin for your diabetes

## 2022-11-21 NOTE — ED Triage Notes (Signed)
Pt presents with recurrent vaginal itching; pt is diabetic.

## 2022-11-28 ENCOUNTER — Ambulatory Visit: Payer: Self-pay | Admitting: Family Medicine

## 2022-12-15 ENCOUNTER — Ambulatory Visit
Admission: EM | Admit: 2022-12-15 | Discharge: 2022-12-15 | Disposition: A | Discharge status: Home / Self Care | Payer: Commercial Managed Care - PPO | Attending: Emergency Medicine | Admitting: Emergency Medicine

## 2022-12-15 DIAGNOSIS — N898 Other specified noninflammatory disorders of vagina: Secondary | ICD-10-CM | POA: Insufficient documentation

## 2022-12-15 MED ORDER — FLUCONAZOLE 150 MG PO TABS: 150.0000 mg | ORAL_TABLET | Freq: Every day | ORAL | 0 refills | Status: AC

## 2022-12-15 NOTE — ED Provider Notes (Signed)
[facility]    CSN: [id number] Arrival date & time: 12/15/22  1428      History   Chief Complaint Chief Complaint  Patient presents with   Vaginitis    HPI Stephanie Reid is a 32 y.o. female.   Patient presents for evaluation of vaginal irritation beginning 3 days ago.  Has not attempted treatment of symptoms denies discharge, odor or urinary symptoms.  Sexually active, no concern for STD.  History of diabetes, uncontrolled.  Endorses upcoming PCP appointment within the week.  Recently treated for yeast vaginitis, endorses symptoms resolved after medications.  Past Medical History:  Diagnosis Date   Bacterial vaginosis    Herpes simplex    Hx of chlamydia infection    Hx of gonorrhea    Hx of trichomoniasis    Seasonal allergies     Patient Active Problem List   Diagnosis Date Noted   New onset type 2 diabetes mellitus ([place]) 07/21/2022   Dehydration 07/21/2022   Vaginal candidiasis 07/21/2022   Amenorrhea, secondary 10/06/2013    Past Surgical History:  Procedure Laterality Date   NO PAST SURGERIES      OB History     Gravida  0   Para  0   Term  0   Preterm  0   AB  0   Living  0      SAB  0   IAB  0   Ectopic  0   Multiple  0   Live Births  0            Home Medications    Prior to Admission medications   Medication Sig Start Date End Date Taking? Authorizing Provider  blood glucose meter kit and supplies Dispense based on patient and insurance preference. Use up to four times daily as directed. (FOR ICD-10 E10.9, E11.9). 07/22/22   Hongalgi, Lenis Dickinson, MD  insulin isophane & regular human KwikPen (NOVOLIN 70/30 KWIKPEN) (70-30) 100 UNIT/ML KwikPen Inject 20 Units into the skin 2 (two) times daily before a meal. 07/31/22   Linward Natal, MD  Insulin Pen Needle (PEN NEEDLES 3/16") 31G X 5 MM MISC Use twice daily as per instructions. 07/22/22   Hongalgi, Lenis Dickinson, MD  metFORMIN (GLUCOPHAGE) 500 MG tablet Take 1 tablet (500  mg total) by mouth daily with breakfast for 7 days, THEN 1 tablet (500 mg total) 2 (two) times daily with a meal for 7 days, THEN 1 tablet (500 mg total) in the morning, at noon, and at bedtime for 7 days, THEN 2 tablets (1,000 mg total) 2 (two) times daily with a meal. 07/31/22 09/20/22  Linward Natal, MD  promethazine (PHENERGAN) 12.5 MG tablet Take 1 tablet (12.5 mg total) by mouth every 6 (six) hours as needed for nausea or vomiting. 12/09/20 02/06/21  Lucrezia Starch, MD    Family History Family History  Problem Relation Age of Onset   Hypertension Mother    Heart disease Mother    Stroke Mother    Diabetes Maternal Grandmother     Social History Social History   Tobacco Use   Smoking status: Every Day    Packs/day: 0.10    Types: Cigarettes   Smokeless tobacco: Never   Tobacco comments:    2 cigs per day   Vaping Use   Vaping Use: Never used  Substance Use Topics   Alcohol use: Yes    Comment: on weekends   Drug use: Yes  Types: Marijuana     Allergies   Apple juice, Banana, Orange fruit [citrus], Lactase-lactobacillus, Spinach, Vicodin [hydrocodone-acetaminophen], and Penicillin g   Review of Systems Review of Systems   Physical Exam Triage Vital Signs ED Triage Vitals  Enc Vitals Group     BP 12/15/22 1440 (!) 111/54     Pulse Rate 12/15/22 1440 77     Resp 12/15/22 1440 18     Temp 12/15/22 1440 98.3 F (36.8 C)     Temp Source 12/15/22 1440 Oral     SpO2 12/15/22 1440 97 %     Weight --      Height --      Head Circumference --      Peak Flow --      Pain Score 12/15/22 1439 5     Pain Loc --      Pain Edu? --      Excl. in [place]? --    No data found.  Updated Vital Signs BP (!) 111/54 (BP Location: Left Arm)   Pulse 77   Temp 98.3 F (36.8 C) (Oral)   Resp 18   LMP 12/04/2009   SpO2 97%   Visual Acuity Right Eye Distance:   Left Eye Distance:   Bilateral Distance:    Right Eye Near:   Left Eye Near:    Bilateral Near:      Physical Exam Constitutional:      Appearance: Normal appearance.  Eyes:     Extraocular Movements: Extraocular movements intact.  Pulmonary:     Effort: Pulmonary effort is normal.  Genitourinary:    Comments: deferred Neurological:     Mental Status: She is alert and oriented to person, place, and time. Mental status is at baseline.      UC Treatments / Results  Labs (all labs ordered are listed, but only abnormal results are displayed) Labs Reviewed - No data to display  EKG   Radiology No results found.  Procedures Procedures (including critical care time)  Medications Ordered in UC Medications - No data to display  Initial Impression / Assessment and Plan / UC Course  I have reviewed the triage vital signs and the nursing notes.  Pertinent labs & imaging results that were available during my care of the patient were reviewed by me and considered in my medical decision making (see chart for details).  Vaginal irritation  Most likely yeast vaginitis as diabetes is uncontrolled, discussed with patient, yeast and BV labs pending, will treat further per protocol, Diflucan prescribed and discussed administration, may follow-up with urgent care as needed Final Clinical Impressions(s) / UC Diagnoses   Final diagnoses:  None   Discharge Instructions   None    ED Prescriptions   None    PDMP not reviewed this encounter.   Hans Eden, [place] 12/18/22 [phone]

## 2022-12-15 NOTE — Discharge Instructions (Addendum)
Declined avs 

## 2022-12-15 NOTE — ED Triage Notes (Signed)
Pt presents with vaginal irritation X 3 days. 

## 2022-12-16 LAB — CERVICOVAGINAL ANCILLARY ONLY
Bacterial Vaginitis (gardnerella): POSITIVE — AB
Candida Glabrata: NEGATIVE
Candida Vaginitis: POSITIVE — AB
Chlamydia: NEGATIVE
Comment: NEGATIVE
Comment: NEGATIVE
Comment: NEGATIVE
Comment: NEGATIVE
Comment: NEGATIVE
Comment: NORMAL
Neisseria Gonorrhea: NEGATIVE
Trichomonas: NEGATIVE

## 2022-12-17 ENCOUNTER — Encounter: Payer: Self-pay | Admitting: Family Medicine

## 2022-12-17 ENCOUNTER — Ambulatory Visit (INDEPENDENT_AMBULATORY_CARE_PROVIDER_SITE_OTHER): Payer: Commercial Managed Care - PPO | Admitting: Family Medicine

## 2022-12-17 ENCOUNTER — Telehealth (HOSPITAL_COMMUNITY): Payer: Self-pay | Admitting: Emergency Medicine

## 2022-12-17 VITALS — BP 124/82 | HR 60 | Temp 98.0°F | Ht 65.0 in | Wt 197.5 lb

## 2022-12-17 DIAGNOSIS — E1165 Type 2 diabetes mellitus with hyperglycemia: Secondary | ICD-10-CM

## 2022-12-17 DIAGNOSIS — Z794 Long term (current) use of insulin: Secondary | ICD-10-CM

## 2022-12-17 LAB — MICROALBUMIN / CREATININE URINE RATIO
Creatinine,U: 120.2 mg/dL
Microalb Creat Ratio: 0.7 mg/g (ref 0.0–30.0)
Microalb, Ur: 0.9 mg/dL (ref 0.0–1.9)

## 2022-12-17 MED ORDER — SEMAGLUTIDE(0.25 OR 0.5MG/DOS) 2 MG/3ML ~~LOC~~ SOPN: 0.2500 mg | PEN_INJECTOR | SUBCUTANEOUS | 1 refills | Status: AC

## 2022-12-17 MED ORDER — METRONIDAZOLE 500 MG PO TABS: 500.0000 mg | ORAL_TABLET | Freq: Two times a day (BID) | ORAL | 0 refills | Status: DC

## 2022-12-17 MED ORDER — METFORMIN HCL 500 MG PO TABS: 500.0000 mg | ORAL_TABLET | Freq: Two times a day (BID) | ORAL | 0 refills | Status: AC

## 2022-12-17 NOTE — Patient Instructions (Signed)
Welcome to [facility]! It was a pleasure meeting you today.  As discussed, Please schedule a 2 month follow up visit today.  Check insurance which continuous monitor is covered.   Colace-stool softener-100mg  daily.    PLEASE NOTE:  If you had any LAB tests please let us know if you have not heard back within a few days. You may see your results on MyChart before we have a chance to review them but we will give you a call once they are reviewed by [place]. If we ordered any REFERRALS today, please let us know if you have not heard from their office within the next week.  Let us know through MyChart if you are needing REFILLS, or have your pharmacy send [place] the request. You can also use MyChart to communicate with me or any office staff.  Please try these tips to maintain a healthy lifestyle:  Eat most of your calories during the day when you are active. Eliminate processed foods including packaged sweets (pies, cakes, cookies), reduce intake of potatoes, white bread, white pasta, and white rice. Look for whole grain options, oat flour or almond flour.  Each meal should contain half fruits/vegetables, one quarter protein, and one quarter carbs (no bigger than a computer mouse).  Cut down on sweet beverages. This includes juice, soda, and sweet tea. Also watch fruit intake, though this is a healthier sweet option, it still contains natural sugar! Limit to 3 servings daily.  Drink at least 1 glass of water with each meal and aim for at least 8 glasses per day  Exercise at least 150 minutes every week.

## 2022-12-17 NOTE — Progress Notes (Signed)
New Patient Office Visit  Subjective:  Patient ID: Stephanie Reid, female    DOB: 1991-10-05  Age: 32 y.o. MRN: [id number]  CC:  Chief Complaint  Patient presents with   Establish Care    Need new pcp Discuss trying Wegovy or Ozempic     HPI Stephanie Reid presents for new pt DM  New pt dx w/DM in Aug-was hosp.  On 70/30 bid. But forgets.not checking sugars. Walks at work all day.  Some tingle in feet recently. Occ blurry vision.  Urinary freq if not taking insulin. Can eat but not enough money for good healthy food. Never filled metformin as cost was too much when went to pharm to pick up(but was with insulin).  No period in 10 yrs-was on depo for 2-3 yrs and none since off.  LMP 21-20 yo.  Using condoms for STD protection but not consistently  Past Medical History:  Diagnosis Date   Bacterial vaginosis    Diabetes mellitus without complication (HCC)    Herpes simplex    Hx of chlamydia infection    Hx of gonorrhea    Hx of trichomoniasis    Seasonal allergies     Past Surgical History:  Procedure Laterality Date   NO PAST SURGERIES      Family History  Problem Relation Age of Onset   Diabetes Mother    Hypertension Mother    Heart disease Mother        pacemaker   Stroke Mother 39   Heart attack Mother 7   Diabetes Father    Hypertension Maternal Grandmother    Diabetes Maternal Grandmother     Social History   Socioeconomic History   Marital status: Single    Spouse name: Not on file   Number of children: 0   Years of education: Not on file   Highest education level: Not on file  Occupational History   Occupation: Event organiser: [employer]  Tobacco Use   Smoking status: Every Day    Packs/day: 0.10    Years: 15.00    Total pack years: 1.50    Types: Cigarettes   Smokeless tobacco: Never   Tobacco comments:    2 cigs per day   Vaping Use   Vaping Use: Never used  Substance and Sexual Activity   Alcohol use: Yes    Alcohol/week:  4.0 standard drinks of alcohol    Types: 4 Glasses of wine per week    Comment: wine qod and liquor on wekend   Drug use: Not Currently    Types: Marijuana   Sexual activity: Yes    Birth control/protection: Condom  Other Topics Concern   Not on file  Social History Narrative   Not on file   Social Determinants of Health   Financial Resource Strain: Not on file  Food Insecurity: No Food Insecurity (04/29/2019)   Hunger Vital Sign    Worried About Running Out of Food in the Last Year: Never true    Ran Out of Food in the Last Year: Never true  Transportation Needs: No Transportation Needs (04/29/2019)   PRAPARE - Administrator, Civil Service (Medical): No    Lack of Transportation (Non-Medical): No  Physical Activity: Not on file  Stress: Not on file  Social Connections: Not on file  Intimate Partner Violence: Not on file    ROS  ROS: Gen: no fever, chills  Skin: no rash, itching ENT: no ear  pain, ear drainage, nasal congestion, rhinorrhea, sinus pressure, sore throat Eyes: no double vision Resp: no cough, wheeze,SOB CV: no CP, palpitations, LE edema,  GI: no heartburn, n/v/d/c, abd pain.  Occ nausea. GU: no dysuria, urgency, hematuria MSK: no joint pain, myalgias, back pain Neuro: no dizziness, headache, weakness, vertigo Psych: no depression, anxiety, SI.  Mind keeps going so hard to sleep at times.   Objective:   Today's Vitals: BP 124/82   Pulse 60   Temp 98 F (36.7 C) (Temporal)   Ht 5\' 5"  (1.651 m)   Wt 197 lb 8 oz (89.6 kg)   LMP 12/04/2009   SpO2 97%   BMI 32.87 kg/m   Physical Exam  Gen: WDWN NAD OAAF HEENT: NCAT, conjunctiva not injected, sclera nonicteric NECK:  supple, no thyromegaly, no nodes, no carotid bruits CARDIAC: RRR, S1S2+, no murmur. DP 2+B LUNGS: CTAB. No wheezes ABDOMEN:  BS+, soft, NTND, No HSM, no masses EXT:  no edema MSK: no gross abnormalities.  NEURO: A&O x3.  CN II-XII intact.  PSYCH: normal mood. Good eye  contact   Assessment & Plan:   Problem List Items Addressed This Visit   None Visit Diagnoses     Type 2 diabetes mellitus with hyperglycemia, with long-term current use of insulin (HCC)    -  Primary   Relevant Medications   metFORMIN (GLUCOPHAGE) 500 MG tablet   Semaglutide,0.25 or 0.5MG /DOS, 2 MG/3ML SOPN   Other Relevant Orders   Comprehensive metabolic panel   Hemoglobin A1c   Lipid panel   TSH   CBC with Differential/Platelet   Microalbumin / creatinine urine ratio     1.  Diabetes type 2 with hyperglycemia.  On insulin 70/30 20 units twice daily.  Frequently forgets medications.  We discussed long-term complications of uncontrolled diabetes.  She has not had regular follow-up.  I am not sure she is quite aware of the implications of not taking meds and monitoring like she should.  Try to set alarm to remember to take the insulin.  Will depend in her purse.  Advised not to leave all the pens out on the counter.  They should be refrigerated except the ones she is using.  Will start metformin 500 mg twice daily.  Start Ozempic 0.25 mg weekly x 4 then 0.5 mg.  Side effects discussed.  Advised to take Colace 100 mg daily as a stool softener.  Strongly warned about constipation.  Needs to monitor sugars a little more closely as Ozempic may decrease the need for current doses of insulin.  Check CBC, CMP, A1c, lipids, TSH, microalbumin creatinine ratio.  Follow-up in 2 months  Outpatient Encounter Medications as of 12/17/2022  Medication Sig   blood glucose meter kit and supplies Dispense based on patient and insurance preference. Use up to four times daily as directed. (FOR ICD-10 E10.9, E11.9).   fluconazole (DIFLUCAN) 150 MG tablet Take 1 tablet (150 mg total) by mouth daily for 3 doses.   insulin isophane & regular human KwikPen (NOVOLIN 70/30 KWIKPEN) (70-30) 100 UNIT/ML KwikPen Inject 20 Units into the skin 2 (two) times daily before a meal.   Insulin Pen Needle (PEN NEEDLES 3/16")  31G X 5 MM MISC Use twice daily as per instructions.   metFORMIN (GLUCOPHAGE) 500 MG tablet Take 1 tablet (500 mg total) by mouth 2 (two) times daily with a meal.   metroNIDAZOLE (FLAGYL) 500 MG tablet Take 1 tablet (500 mg total) by mouth 2 (two) times daily.  Semaglutide,0.25 or 0.5MG /DOS, 2 MG/3ML SOPN Inject 0.25 mg into the skin once a week.   [DISCONTINUED] metFORMIN (GLUCOPHAGE) 500 MG tablet Take 1 tablet (500 mg total) by mouth daily with breakfast for 7 days, THEN 1 tablet (500 mg total) 2 (two) times daily with a meal for 7 days, THEN 1 tablet (500 mg total) in the morning, at noon, and at bedtime for 7 days, THEN 2 tablets (1,000 mg total) 2 (two) times daily with a meal.   [DISCONTINUED] promethazine (PHENERGAN) 12.5 MG tablet Take 1 tablet (12.5 mg total) by mouth every 6 (six) hours as needed for nausea or vomiting.   No facility-administered encounter medications on file as of 12/17/2022.    Follow-up: Return in about 2 months (around 02/15/2023) for dm.   Wellington Hampshire, MD

## 2022-12-18 LAB — CBC WITH DIFFERENTIAL/PLATELET
Basophils Absolute: 0 10*3/uL (ref 0.0–0.1)
Basophils Relative: 0.7 % (ref 0.0–3.0)
Eosinophils Absolute: 0.2 10*3/uL (ref 0.0–0.7)
Eosinophils Relative: 2.9 % (ref 0.0–5.0)
HCT: 41.7 % (ref 36.0–46.0)
Hemoglobin: 14 g/dL (ref 12.0–15.0)
Lymphocytes Relative: 29.2 % (ref 12.0–46.0)
Lymphs Abs: 1.8 10*3/uL (ref 0.7–4.0)
MCHC: 33.6 g/dL (ref 30.0–36.0)
MCV: 88.9 fl (ref 78.0–100.0)
Monocytes Absolute: 0.4 10*3/uL (ref 0.1–1.0)
Monocytes Relative: 5.9 % (ref 3.0–12.0)
Neutro Abs: 3.7 10*3/uL (ref 1.4–7.7)
Neutrophils Relative %: 61.3 % (ref 43.0–77.0)
Platelets: 216 10*3/uL (ref 150.0–400.0)
RBC: 4.69 Mil/uL (ref 3.87–5.11)
RDW: 13.1 % (ref 11.5–15.5)
WBC: 6.1 10*3/uL (ref 4.0–10.5)

## 2022-12-18 LAB — COMPREHENSIVE METABOLIC PANEL
ALT: 17 U/L (ref 0–35)
AST: 15 U/L (ref 0–37)
Albumin: 4.2 g/dL (ref 3.5–5.2)
Alkaline Phosphatase: 91 U/L (ref 39–117)
BUN: 11 mg/dL (ref 6–23)
CO2: 31 mEq/L (ref 19–32)
Calcium: 9.9 mg/dL (ref 8.4–10.5)
Chloride: 96 mEq/L (ref 96–112)
Creatinine, Ser: 0.7 mg/dL (ref 0.40–1.20)
GFR: 113.85 mL/min (ref >60.00)
Glucose, Bld: 350 mg/dL — ABNORMAL HIGH (ref 70–99)
Potassium: 4.2 mEq/L (ref 3.5–5.1)
Sodium: 135 mEq/L (ref 135–145)
Total Bilirubin: 0.6 mg/dL (ref 0.2–1.2)
Total Protein: 7.9 g/dL (ref 6.0–8.3)

## 2022-12-18 LAB — LIPID PANEL
Cholesterol: 213 mg/dL — ABNORMAL HIGH (ref 0–200)
HDL: 41.9 mg/dL (ref >39.00)
LDL Cholesterol: 134 mg/dL — ABNORMAL HIGH (ref 0–99)
NonHDL: 171.28
Total CHOL/HDL Ratio: 5
Triglycerides: 184 mg/dL — ABNORMAL HIGH (ref 0.0–149.0)
VLDL: 36.8 mg/dL (ref 0.0–40.0)

## 2022-12-18 LAB — HEMOGLOBIN A1C: Hgb A1c MFr Bld: 13.5 % — ABNORMAL HIGH (ref 4.6–6.5)

## 2022-12-18 LAB — TSH: TSH: 0.9 u[IU]/mL (ref 0.35–5.50)

## 2022-12-19 ENCOUNTER — Telehealth: Payer: Self-pay | Admitting: Family Medicine

## 2022-12-19 MED ORDER — DEXCOM G7 SENSOR MISC: 1.0000 | 5 refills | Status: AC

## 2022-12-19 MED ORDER — DEXCOM G7 RECEIVER DEVI: 1.0000 | Freq: Once | 1 refills | Status: AC

## 2022-12-19 NOTE — Telephone Encounter (Signed)
Patent calling Dr Cherlynn Kaiser back re: ASV message "Check insurance which continuous monitor is covered " per patient she stated it is covered. She did not provide name of monitor and would like a call back at [phone].

## 2022-12-19 NOTE — Telephone Encounter (Signed)
Please see message below

## 2022-12-19 NOTE — Progress Notes (Signed)
1.  Cholesterol too high-send in atorvastatin 20mg  #90/0 2.  Sugars way too high-needs to do better at taking insulin and should increase by 5 units twice daily and let us know in 2 weeks what sugars are running

## 2022-12-19 NOTE — Telephone Encounter (Signed)
Spoke with patient and she stated that she forgot to ask insurance which monitor was covered, she will have to call them back. Patient stated that PA is needed for Ozempic. Advised patient that I will contact PA department so they can work on it and it may take 24-72 hours for a decision, but someone will let her know the outcome.

## 2022-12-19 NOTE — Telephone Encounter (Signed)
Patient states is dexom - stated that is all the insurance told her. No number. Just Dexom.

## 2022-12-19 NOTE — Addendum Note (Signed)
Addended by: Wellington Hampshire on: 12/19/2022 07:24 PM   Modules accepted: Orders

## 2022-12-20 ENCOUNTER — Other Ambulatory Visit: Payer: Self-pay | Admitting: *Deleted

## 2022-12-20 DIAGNOSIS — E7800 Pure hypercholesterolemia, unspecified: Secondary | ICD-10-CM

## 2022-12-20 MED ORDER — ATORVASTATIN CALCIUM 20 MG PO TABS: 20.0000 mg | ORAL_TABLET | Freq: Every day | ORAL | 0 refills | Status: AC

## 2022-12-20 NOTE — Telephone Encounter (Signed)
Noted  

## 2022-12-27 NOTE — Telephone Encounter (Signed)
Patient called for an update. Requests callback to know where in the process we are with PA for ozempic.

## 2022-12-30 ENCOUNTER — Telehealth: Payer: Self-pay | Admitting: Family Medicine

## 2022-12-30 ENCOUNTER — Encounter: Payer: Self-pay | Admitting: *Deleted

## 2022-12-30 NOTE — Telephone Encounter (Signed)
Patient called for an update. Requests callback to know where in the process we are with PA for ozempic. Please advise.

## 2022-12-30 NOTE — Telephone Encounter (Signed)
Patient sent mychart message with update.

## 2022-12-31 NOTE — Telephone Encounter (Signed)
Insurance company called back and states the zip code was incorrect for pt - should be [id number]. ID #[id number]

## 2022-12-31 NOTE — Telephone Encounter (Signed)
Pt called and I read her Qs message and she will call her insurance company

## 2023-01-01 ENCOUNTER — Other Ambulatory Visit (HOSPITAL_COMMUNITY): Payer: Self-pay

## 2023-01-02 NOTE — Telephone Encounter (Signed)
Pt called for details on the PA. Please call pt back.

## 2023-01-03 ENCOUNTER — Other Ambulatory Visit: Payer: Self-pay

## 2023-01-03 NOTE — Telephone Encounter (Signed)
Patient called for an update. Informed patient of QuaNeisha's message below. Pt verbalized understanding but states she spoke to insurance company yesterday as well. States representative told her that they faxed a form yesterday for completion.   Patient stated she will be calling insurance company to determine when paperwork was faxed. She requests to speak with Noelle Penner at some point

## 2023-01-03 NOTE — Telephone Encounter (Signed)
Spoke to [employer] and they stated PA could not be done through covermymeds. They gave me the promptPA website to complete PA, office note faxed to promptPA. Patient notified and verbalized understanding.

## 2023-01-08 ENCOUNTER — Telehealth: Payer: Self-pay | Admitting: Family Medicine

## 2023-01-08 ENCOUNTER — Other Ambulatory Visit: Payer: Self-pay | Admitting: Family Medicine

## 2023-01-08 MED ORDER — NOVOLIN 70/30 FLEXPEN RELION (70-30) 100 UNIT/ML ~~LOC~~ SUPN: 20.0000 [IU] | PEN_INJECTOR | Freq: Two times a day (BID) | SUBCUTANEOUS | 0 refills | Status: DC

## 2023-01-08 NOTE — Telephone Encounter (Signed)
Noted  

## 2023-01-08 NOTE — Telephone Encounter (Signed)
Patient requests the following RX (Previously prescribed by previous Provider)   LAST APPOINTMENT DATE:  12/17/22  NEXT APPOINTMENT DATE: 02/25/23  MEDICATION:  insulin isophane & regular human KwikPen (NOVOLIN 70/30 KWIKPEN) (70-30) 100 UNIT/ML KwikPen  Is the patient out of medication?  Yes-out for 2 weeks  PHARMACY:  [facility] [place] Phone: [phone]  Fax: [phone]      Let patient know to contact pharmacy at the end of the day to make sure medication is ready.  Please notify patient to allow 48-72 hours to process

## 2023-01-08 NOTE — Telephone Encounter (Signed)
Please see message below and advise.

## 2023-01-10 ENCOUNTER — Encounter: Payer: Self-pay | Admitting: *Deleted

## 2023-02-15 ENCOUNTER — Other Ambulatory Visit: Payer: Self-pay | Admitting: Family Medicine

## 2023-02-25 ENCOUNTER — Ambulatory Visit: Payer: Self-pay | Admitting: Family Medicine

## 2023-03-08 ENCOUNTER — Ambulatory Visit
Admission: EM | Admit: 2023-03-08 | Discharge: 2023-03-08 | Disposition: A | Discharge status: Home / Self Care | Payer: Self-pay | Attending: Emergency Medicine | Admitting: Emergency Medicine

## 2023-03-08 DIAGNOSIS — Z760 Encounter for issue of repeat prescription: Secondary | ICD-10-CM

## 2023-03-08 DIAGNOSIS — N898 Other specified noninflammatory disorders of vagina: Secondary | ICD-10-CM

## 2023-03-08 DIAGNOSIS — B3731 Acute candidiasis of vulva and vagina: Secondary | ICD-10-CM

## 2023-03-08 DIAGNOSIS — Z113 Encounter for screening for infections with a predominantly sexual mode of transmission: Secondary | ICD-10-CM

## 2023-03-08 MED ORDER — FLUCONAZOLE 150 MG PO TABS: 150.0000 mg | ORAL_TABLET | Freq: Every day | ORAL | 0 refills | Status: DC

## 2023-03-08 MED ORDER — VALACYCLOVIR HCL 1 G PO TABS: 1000.0000 mg | ORAL_TABLET | Freq: Three times a day (TID) | ORAL | 0 refills | Status: DC

## 2023-03-08 NOTE — ED Triage Notes (Addendum)
Pt presents to [facility] with concern for yeast infection for one week with vaginal burning and discomfort. Pt reports no odder and no discharge. Pt reports worse near her rectum has used mono stat with no improvement. Pt reports she had a herpes break out last week and is now out of her valacyclovir needs refill.

## 2023-03-08 NOTE — ED Provider Notes (Signed)
[facility]ARE CENTER   [id number] 03/08/23 Arrival Time: 1224   GY:JEHUDJS DISCHARGE  SUBJECTIVE:  Stephanie Reid is a 32 y.o. female who presents to the urgent care for complaint of yeast infection for the past 1 week.  Patient is reporting vaginal itching and discomfort..  She is sexually active.  Denies recent travel.  Has tried OTC medication without relief.  Denies any alleviating or aggravating factors.  Reports s previous symptoms in the past.   Denies fever, chills, fatigue,, vaginal bleeding.  She denies fever, chills, nausea, vomiting, abdominal or pelvic pain, urinary symptoms,  vaginal bleeding, dyspareunia, vaginal rashes or lesions.   Patient's last menstrual period was 12/04/2009. Current birth control method: Compliant with BC:  ROS: As per HPI.  All other pertinent ROS negative.     Past Medical History:  Diagnosis Date   Bacterial vaginosis    Diabetes mellitus without complication    Herpes simplex    Hx of chlamydia infection    Hx of gonorrhea    Hx of trichomoniasis    Seasonal allergies    Past Surgical History:  Procedure Laterality Date   NO PAST SURGERIES     Allergies  Allergen Reactions   Apple Juice Anaphylaxis   Banana Anaphylaxis   Orange Fruit [Citrus] Anaphylaxis   Lactase-Lactobacillus Other (See Comments)    hemmorrhoids   Spinach Diarrhea   Vicodin [Hydrocodone-Acetaminophen] Nausea And Vomiting   Penicillin G      GI Upset (intolerance) Did it involve swelling of the face/tongue/throat, SOB, or low BP? No Did it involve sudden or severe rash/hives, skin peeling, or any reaction on the inside of your mouth or nose? No Did you need to seek medical attention at a hospital or doctor's office? No When did it last happen?  not recent If all above answers are "NO", may proceed with cephalosporin use.    No current facility-administered medications on file prior to encounter.   Current Outpatient Medications on File Prior to  Encounter  Medication Sig Dispense Refill   atorvastatin (LIPITOR) 20 MG tablet Take 1 tablet (20 mg total) by mouth daily. 90 tablet 0   blood glucose meter kit and supplies Dispense based on patient and insurance preference. Use up to four times daily as directed. (FOR ICD-10 E10.9, E11.9). 1 each 0   Continuous Blood Gluc Sensor (DEXCOM G7 SENSOR) MISC 1 each by Does not apply route every 14 (fourteen) days. Every 10 days. Not 14 3 each 5   Insulin Pen Needle (PEN NEEDLES 3/16") 31G X 5 MM MISC Use twice daily as per instructions. 100 each 0   metFORMIN (GLUCOPHAGE) 500 MG tablet Take 1 tablet (500 mg total) by mouth 2 (two) times daily with a meal. 180 tablet 0   metroNIDAZOLE (FLAGYL) 500 MG tablet Take 1 tablet (500 mg total) by mouth 2 (two) times daily. 14 tablet 0   NOVOLIN 70/30 KWIKPEN (70-30) 100 UNIT/ML KwikPen INJECT 20 UNITS SUBCUTANEOUSLY TWICE DAILY BEFORE A MEAL 15 mL 0   Semaglutide,0.25 or 0.5MG /DOS, 2 MG/3ML SOPN Inject 0.25 mg into the skin once a week. 3 mL 1   [DISCONTINUED] promethazine (PHENERGAN) 12.5 MG tablet Take 1 tablet (12.5 mg total) by mouth every 6 (six) hours as needed for nausea or vomiting. 30 tablet 0    Social History   Socioeconomic History   Marital status: Single    Spouse name: Not on file   Number of children: 0   Years of education:  Not on file   Highest education level: Not on file  Occupational History   Occupation: Event organiser: [employer]  Tobacco Use   Smoking status: Every Day    Packs/day: 0.10    Years: 15.00    Additional pack years: 0.00    Total pack years: 1.50    Types: Cigarettes   Smokeless tobacco: Never   Tobacco comments:    2 cigs per day   Vaping Use   Vaping Use: Never used  Substance and Sexual Activity   Alcohol use: Yes    Alcohol/week: 4.0 standard drinks of alcohol    Types: 4 Glasses of wine per week    Comment: wine qod and liquor on wekend   Drug use: Not Currently    Types: Marijuana   Sexual  activity: Yes    Birth control/protection: Condom  Other Topics Concern   Not on file  Social History Narrative   Not on file   Social Determinants of Health   Financial Resource Strain: Not on file  Food Insecurity: No Food Insecurity (04/29/2019)   Hunger Vital Sign    Worried About Running Out of Food in the Last Year: Never true    Ran Out of Food in the Last Year: Never true  Transportation Needs: No Transportation Needs (04/29/2019)   PRAPARE - Administrator, Civil Service (Medical): No    Lack of Transportation (Non-Medical): No  Physical Activity: Not on file  Stress: Not on file  Social Connections: Not on file  Intimate Partner Violence: Not on file   Family History  Problem Relation Age of Onset   Diabetes Mother    Hypertension Mother    Heart disease Mother        pacemaker   Stroke Mother 63   Heart attack Mother 75   Diabetes Father    Hypertension Maternal Grandmother    Diabetes Maternal Grandmother     OBJECTIVE:  Vitals:   03/08/23 1353  BP: 124/82  Pulse: 82  Resp: 19  Temp: 97.9 F (36.6 C)  TempSrc: Oral  SpO2: 98%     General appearance: Alert, NAD, appears stated age Head: NCAT Throat: lips, mucosa, and tongue normal; teeth and gums normal Lungs: CTA bilaterally without adventitious breath sounds Heart: regular rate and rhythm.  Radial pulses 2+ symmetrical bilaterally Back: no CVA tenderness Abdomen: soft, non-tender; bowel sounds normal; no masses or organomegaly; no guarding or rebound tenderness GU: self vaginal swab obtained. Exam deferred Skin: warm and dry Psychological:  Alert and cooperative. Normal mood and affect.  LABS:  Results for orders placed or performed in visit on 12/17/22  Comprehensive metabolic panel  Result Value Ref Range   Sodium 135 135 - 145 mEq/L   Potassium 4.2 3.5 - 5.1 mEq/L   Chloride 96 96 - 112 mEq/L   CO2 31 19 - 32 mEq/L   Glucose, Bld 350 (H) 70 - 99 mg/dL   BUN 11 6 - 23 mg/dL    Creatinine, Ser 1.61 0.40 - 1.20 mg/dL   Total Bilirubin 0.6 0.2 - 1.2 mg/dL   Alkaline Phosphatase 91 39 - 117 U/L   AST 15 0 - 37 U/L   ALT 17 0 - 35 U/L   Total Protein 7.9 6.0 - 8.3 g/dL   Albumin 4.2 3.5 - 5.2 g/dL   GFR 096.04 >54.09 mL/min   Calcium 9.9 8.4 - 10.5 mg/dL  Hemoglobin W1X  Result Value Ref Range  Hgb A1c MFr Bld 13.5 (H) 4.6 - 6.5 %  Lipid panel  Result Value Ref Range   Cholesterol 213 (H) 0 - 200 mg/dL   Triglycerides 161.0 (H) 0.0 - 149.0 mg/dL   HDL 96.04 >54.09 mg/dL   VLDL 81.1 0.0 - 91.4 mg/dL   LDL Cholesterol 782 (H) 0 - 99 mg/dL   Total CHOL/HDL Ratio 5    NonHDL 171.28   TSH  Result Value Ref Range   TSH 0.90 0.35 - 5.50 uIU/mL  CBC with Differential/Platelet  Result Value Ref Range   WBC 6.1 4.0 - 10.5 K/uL   RBC 4.69 3.87 - 5.11 Mil/uL   Hemoglobin 14.0 12.0 - 15.0 g/dL   HCT 95.6 21.3 - 08.6 %   MCV 88.9 78.0 - 100.0 fl   MCHC 33.6 30.0 - 36.0 g/dL   RDW 57.8 46.9 - 62.9 %   Platelets 216.0 150.0 - 400.0 K/uL   Neutrophils Relative % 61.3 43.0 - 77.0 %   Lymphocytes Relative 29.2 12.0 - 46.0 %   Monocytes Relative 5.9 3.0 - 12.0 %   Eosinophils Relative 2.9 0.0 - 5.0 %   Basophils Relative 0.7 0.0 - 3.0 %   Neutro Abs 3.7 1.4 - 7.7 K/uL   Lymphs Abs 1.8 0.7 - 4.0 K/uL   Monocytes Absolute 0.4 0.1 - 1.0 K/uL   Eosinophils Absolute 0.2 0.0 - 0.7 K/uL   Basophils Absolute 0.0 0.0 - 0.1 K/uL  Microalbumin / creatinine urine ratio  Result Value Ref Range   Microalb, Ur 0.9 0.0 - 1.9 mg/dL   Creatinine,U 528.4 mg/dL   Microalb Creat Ratio 0.7 0.0 - 30.0 mg/g    Labs Reviewed - No data to display  ASSESSMENT & PLAN:  1. Screening for STD (sexually transmitted disease)   2. Vaginal irritation   3. Medicine refill     Meds ordered this encounter  Medications   valACYclovir (VALTREX) 1000 MG tablet    Sig: Take 1 tablet (1,000 mg total) by mouth 3 (three) times daily.    Dispense:  21 tablet    Refill:  0   fluconazole  (DIFLUCAN) 150 MG tablet    Sig: Take 1 tablet (150 mg total) by mouth daily. May take the second dose 72 hours after the first if symptoms does not resolve    Dispense:  2 tablet    Refill:  0    Pending: Labs Reviewed - No data to display  Discharge instructions  Vaginal self-swab obtained.  We will follow up with you regarding abnormal results Prescribed diflucan 150 mg once daily and then second dose 72 hours later Valacyclovir was refilled Take medications as prescribed and to completion If tests results are positive, please abstain from sexual activity until you and your partner(s) have been treated Follow up with PCP or [facility] if symptoms persists Return here or go to ER if you have any new or worsening symptoms fever, chills, nausea, vomiting, abdominal or pelvic pain, painful intercourse, vaginal discharge, vaginal bleeding, persistent symptoms despite treatment, etc...  Reviewed expectations re: course of current medical issues. Questions answered. Outlined signs and symptoms indicating need for more acute intervention. Patient verbalized understanding. After Visit Summary given.        Durward Parcel, FNP 03/08/23 1419

## 2023-03-08 NOTE — Discharge Instructions (Addendum)
Vaginal self-swab obtained.  We will follow up with you regarding abnormal results Prescribed diflucan 150 mg once daily and then second dose 72 hours later Valacyclovir was refilled Take medications as prescribed and to completion If tests results are positive, please abstain from sexual activity until you and your partner(s) have been treated Follow up with PCP or [facility] if symptoms persists Return here or go to ER if you have any new or worsening symptoms fever, chills, nausea, vomiting, abdominal or pelvic pain, painful intercourse, vaginal discharge, vaginal bleeding, persistent symptoms despite treatment, etc..Marland Kitchen

## 2023-03-11 LAB — CERVICOVAGINAL ANCILLARY ONLY
Bacterial Vaginitis (gardnerella): NEGATIVE
Candida Glabrata: NEGATIVE
Candida Vaginitis: POSITIVE — AB
Chlamydia: NEGATIVE
Comment: NEGATIVE
Comment: NEGATIVE
Comment: NEGATIVE
Comment: NEGATIVE
Comment: NEGATIVE
Comment: NORMAL
Neisseria Gonorrhea: NEGATIVE
Trichomonas: NEGATIVE

## 2023-03-26 ENCOUNTER — Ambulatory Visit
Admission: EM | Admit: 2023-03-26 | Discharge: 2023-03-26 | Disposition: A | Discharge status: Home / Self Care | Payer: Self-pay | Attending: Emergency Medicine | Admitting: Emergency Medicine

## 2023-03-26 DIAGNOSIS — L02411 Cutaneous abscess of right axilla: Secondary | ICD-10-CM

## 2023-03-26 MED ORDER — TRAMADOL HCL 50 MG PO TABS: 50.0000 mg | ORAL_TABLET | Freq: Four times a day (QID) | ORAL | 0 refills | Status: AC | PRN

## 2023-03-26 MED ORDER — DOXYCYCLINE HYCLATE 100 MG PO CAPS: 100.0000 mg | ORAL_CAPSULE | Freq: Two times a day (BID) | ORAL | 0 refills | Status: DC

## 2023-03-26 NOTE — ED Triage Notes (Signed)
Pt c/o abscess under right arm onset ~ 3 days ago progressively worsening.

## 2023-03-26 NOTE — ED Provider Notes (Signed)
[facility]    CSN: [id number] Arrival date & time: 03/26/23  1649      History   Chief Complaint Chief Complaint  Patient presents with   Abscess    HPI Stephanie Reid is a 32 y.o. female.   She presents for evaluation of an abscess to the right axilla beginning 3 days ago.  Has increased in size and become more painful, unable to lift the arm.  Has attempted use of warm compresses which has been minimally effective.  Denies fevers or drainage..  Past Medical History:  Diagnosis Date   Bacterial vaginosis    Diabetes mellitus without complication (HCC)    Herpes simplex    Hx of chlamydia infection    Hx of gonorrhea    Hx of trichomoniasis    Seasonal allergies     Patient Active Problem List   Diagnosis Date Noted   New onset type 2 diabetes mellitus (HCC) 07/21/2022   Dehydration 07/21/2022   Vaginal candidiasis 07/21/2022   Amenorrhea, secondary 10/06/2013    Past Surgical History:  Procedure Laterality Date   NO PAST SURGERIES      OB History     Gravida  0   Para  0   Term  0   Preterm  0   AB  0   Living  0      SAB  0   IAB  0   Ectopic  0   Multiple  0   Live Births  0            Home Medications    Prior to Admission medications   Medication Sig Start Date End Date Taking? Authorizing Provider  atorvastatin (LIPITOR) 20 MG tablet Take 1 tablet (20 mg total) by mouth daily. 12/20/22   Jeani Sow, MD  doxycycline (VIBRAMYCIN) 100 MG capsule Take 1 capsule (100 mg total) by mouth 2 (two) times daily. 03/26/23  Yes Cleta Heatley, Elita Boone, NP  traMADol (ULTRAM) 50 MG tablet Take 1 tablet (50 mg total) by mouth every 6 (six) hours as needed. 03/26/23  Yes Daley Gosse, Elita Boone, NP  blood glucose meter kit and supplies Dispense based on patient and insurance preference. Use up to four times daily as directed. (FOR ICD-10 E10.9, E11.9). 07/22/22   Hongalgi, Maximino Greenland, MD  Continuous Blood Gluc Sensor (DEXCOM G7 SENSOR) MISC  1 each by Does not apply route every 14 (fourteen) days. Every 10 days. Not 14 12/19/22   Jeani Sow, MD  fluconazole (DIFLUCAN) 150 MG tablet Take 1 tablet (150 mg total) by mouth daily. May take the second dose 72 hours after the first if symptoms does not resolve 03/08/23   Durward Parcel, FNP  Insulin Pen Needle (PEN NEEDLES 3/16") 31G X 5 MM MISC Use twice daily as per instructions. 07/22/22   Hongalgi, Maximino Greenland, MD  metFORMIN (GLUCOPHAGE) 500 MG tablet Take 1 tablet (500 mg total) by mouth 2 (two) times daily with a meal. 12/17/22   Jeani Sow, MD  metroNIDAZOLE (FLAGYL) 500 MG tablet Take 1 tablet (500 mg total) by mouth 2 (two) times daily. 12/17/22   Merrilee Jansky, MD  NOVOLIN 70/30 KWIKPEN (70-30) 100 UNIT/ML KwikPen INJECT 20 UNITS SUBCUTANEOUSLY TWICE DAILY BEFORE A MEAL 02/15/23   Jeani Sow, MD  Semaglutide,0.25 or 0.5MG /DOS, 2 MG/3ML SOPN Inject 0.25 mg into the skin once a week. 12/17/22   Jeani Sow, MD  valACYclovir (VALTREX) 1000 MG  tablet Take 1 tablet (1,000 mg total) by mouth 3 (three) times daily. 03/08/23   Avegno, Zachery Dakins, FNP  promethazine (PHENERGAN) 12.5 MG tablet Take 1 tablet (12.5 mg total) by mouth every 6 (six) hours as needed for nausea or vomiting. 12/09/20 02/06/21  Milagros Loll, MD    Family History Family History  Problem Relation Age of Onset   Diabetes Mother    Hypertension Mother    Heart disease Mother        pacemaker   Stroke Mother 53   Heart attack Mother 63   Diabetes Father    Hypertension Maternal Grandmother    Diabetes Maternal Grandmother     Social History Social History   Tobacco Use   Smoking status: Every Day    Packs/day: 0.10    Years: 15.00    Additional pack years: 0.00    Total pack years: 1.50    Types: Cigarettes   Smokeless tobacco: Never   Tobacco comments:    2 cigs per day   Vaping Use   Vaping Use: Never used  Substance Use Topics   Alcohol use: Yes    Alcohol/week: 4.0  standard drinks of alcohol    Types: 4 Glasses of wine per week    Comment: wine qod and liquor on wekend   Drug use: Not Currently    Types: Marijuana     Allergies   Apple juice, Banana, Orange fruit [citrus], Lactase-lactobacillus, Spinach, Vicodin [hydrocodone-acetaminophen], and Penicillin g   Review of Systems Review of Systems  Constitutional: Negative.   Respiratory: Negative.    Cardiovascular: Negative.   Skin:  Positive for rash. Negative for color change, pallor and wound.  Neurological: Negative.      Physical Exam Triage Vital Signs ED Triage Vitals [03/26/23 1709]  Enc Vitals Group     BP 118/81     Pulse Rate 100     Resp 18     Temp 99.4 F (37.4 C)     Temp Source Oral     SpO2 95 %     Weight      Height      Head Circumference      Peak Flow      Pain Score 10     Pain Loc      Pain Edu?      Excl. in GC?    No data found.  Updated Vital Signs BP 118/81 (BP Location: Left Arm)   Pulse 100   Temp 99.4 F (37.4 C) (Oral)   Resp 18   LMP 12/04/2009   SpO2 95%   Visual Acuity Right Eye Distance:   Left Eye Distance:   Bilateral Distance:    Right Eye Near:   Left Eye Near:    Bilateral Near:     Physical Exam Constitutional:      Appearance: Normal appearance.  Eyes:     Extraocular Movements: Extraocular movements intact.  Pulmonary:     Effort: Pulmonary effort is normal.  Skin:    Comments: 2 x 3 erythematous and tender abscess present to the right axilla  Neurological:     Mental Status: She is alert and oriented to person, place, and time. Mental status is at baseline.      UC Treatments / Results  Labs (all labs ordered are listed, but only abnormal results are displayed) Labs Reviewed - No data to display  EKG   Radiology No results found.  Procedures Incision and  Drainage  Date/Time: 03/26/2023 5:39 PM  Performed by: Valinda Hoar, NP Authorized by: Valinda Hoar, NP   Consent:    Consent  obtained:  Verbal   Consent given by:  Patient Universal protocol:    Procedure explained and questions answered to patient or proxy's satisfaction: yes     Patient identity confirmed:  Verbally with patient Location:    Type:  Abscess   Size:  2x3   Location: Right axilla. Pre-procedure details:    Skin preparation:  Chlorhexidine with alcohol Anesthesia:    Anesthesia method:  Local infiltration   Local anesthetic:  Lidocaine 1% w/o epi Procedure type:    Complexity:  Simple Procedure details:    Incision types:  Single straight   Drainage:  Purulent and bloody   Drainage amount:  Moderate   Wound treatment:  Wound left open   Packing materials:  None Post-procedure details:    Procedure completion:  Procedure terminated at patient's request (due to pain)  (including critical care time)  Medications Ordered in UC Medications - No data to display  Initial Impression / Assessment and Plan / UC Course  I have reviewed the triage vital signs and the nursing notes.  Pertinent labs & imaging results that were available during my care of the patient were reviewed by me and considered in my medical decision making (see chart for details).  Abscess of the right axilla  Incision and drainage completed, able to expel copious amounts of purulent drainage, procedure stopped early as patient is unable to tolerate further due to pain, prescribed doxycycline and tramadol, recommended continued use of warm compresses, may follow-up for evaluation for nonhealing nondraining site Final Clinical Impressions(s) / UC Diagnoses   Final diagnoses:  None     Discharge Instructions      Take doxycycline every morning and every evening for 7 days  May use tramadol every 6 hours as needed for severe pain, attempt use of Tylenol and ibuprofen for aches, be mindful this medicine will make you feel drowsy  Hold warm-hot compresses to affected area at least 4 times a day, this helps to  facilitate draining, the more the better  Please return for evaluation for increased swelling, increased tenderness or pain, non healing site, non draining site, you begin to have fever or chills   We reviewed the etiology of recurrent abscesses of skin.  Skin abscesses are collections of pus within the dermis and deeper skin tissues. Skin abscesses manifest as painful, tender, fluctuant, and erythematous nodules, frequently surmounted by a pustule and surrounded by a rim of erythematous swelling.  Spontaneous drainage of purulent material may occur.  Fever can occur on occasion.    -Skin abscesses can develop in healthy individuals with no predisposing conditions other than skin or nasal carriage of Staphylococcus aureus.  Individuals in close contact with others who have active infection with skin abscesses are at increased risk which is likely to explain why twin brother has similar episodes.   In addition, any process leading to a breach in the skin barrier can also predispose to the development of a skin abscesses, such as atopic dermatitis.      ED Prescriptions     Medication Sig Dispense Auth. Provider   doxycycline (VIBRAMYCIN) 100 MG capsule Take 1 capsule (100 mg total) by mouth 2 (two) times daily. 14 capsule Shuntell Foody R, NP   traMADol (ULTRAM) 50 MG tablet Take 1 tablet (50 mg total) by mouth every 6 (six)  hours as needed. 15 tablet Sherrol Vicars, Elita Boone, NP      I have reviewed the PDMP during this encounter.   Valinda Hoar, [place] 03/26/23 [phone]

## 2023-03-26 NOTE — Discharge Instructions (Addendum)
Take doxycycline every morning and every evening for 7 days  May use tramadol every 6 hours as needed for severe pain, attempt use of Tylenol and ibuprofen for aches, be mindful this medicine will make you feel drowsy  Hold warm-hot compresses to affected area at least 4 times a day, this helps to facilitate draining, the more the better  Please return for evaluation for increased swelling, increased tenderness or pain, non healing site, non draining site, you begin to have fever or chills   We reviewed the etiology of recurrent abscesses of skin.  Skin abscesses are collections of pus within the dermis and deeper skin tissues. Skin abscesses manifest as painful, tender, fluctuant, and erythematous nodules, frequently surmounted by a pustule and surrounded by a rim of erythematous swelling.  Spontaneous drainage of purulent material may occur.  Fever can occur on occasion.    -Skin abscesses can develop in healthy individuals with no predisposing conditions other than skin or nasal carriage of Staphylococcus aureus.  Individuals in close contact with others who have active infection with skin abscesses are at increased risk which is likely to explain why twin brother has similar episodes.   In addition, any process leading to a breach in the skin barrier can also predispose to the development of a skin abscesses, such as atopic dermatitis.

## 2023-04-06 ENCOUNTER — Other Ambulatory Visit: Payer: Self-pay | Admitting: Family Medicine

## 2023-04-07 NOTE — Telephone Encounter (Signed)
Unable to contact pt , lvm asking for her to cb to schedule

## 2023-06-28 ENCOUNTER — Encounter (HOSPITAL_COMMUNITY): Payer: Self-pay

## 2023-06-28 ENCOUNTER — Ambulatory Visit (HOSPITAL_COMMUNITY)
Admission: EM | Admit: 2023-06-28 | Discharge: 2023-06-28 | Disposition: A | Discharge status: Home / Self Care | Payer: Self-pay | Attending: Emergency Medicine | Admitting: Emergency Medicine

## 2023-06-28 DIAGNOSIS — N898 Other specified noninflammatory disorders of vagina: Secondary | ICD-10-CM

## 2023-06-28 DIAGNOSIS — E119 Type 2 diabetes mellitus without complications: Secondary | ICD-10-CM

## 2023-06-28 DIAGNOSIS — Z794 Long term (current) use of insulin: Secondary | ICD-10-CM | POA: Insufficient documentation

## 2023-06-28 LAB — POCT FASTING CBG KUC MANUAL ENTRY: POCT Glucose (KUC): 344 mg/dL — AB (ref 70–99)

## 2023-06-28 MED ORDER — BLOOD GLUCOSE MONITORING SUPPL DEVI: 1.0000 | Freq: Three times a day (TID) | 0 refills | Status: AC

## 2023-06-28 MED ORDER — LANCET DEVICE MISC: 1.0000 | Freq: Three times a day (TID) | 0 refills | Status: AC

## 2023-06-28 MED ORDER — BLOOD GLUCOSE TEST VI STRP: 1.0000 | ORAL_STRIP | Freq: Every day | 0 refills | Status: AC | PRN

## 2023-06-28 MED ORDER — METRONIDAZOLE 0.75 % VA GEL: 1.0000 | Freq: Two times a day (BID) | VAGINAL | 0 refills | Status: DC

## 2023-06-28 MED ORDER — LANCETS MISC. MISC: 1.0000 | Freq: Three times a day (TID) | 0 refills | Status: AC

## 2023-06-28 NOTE — Discharge Instructions (Addendum)
Today you are being treated prophylactically for  Bacterial vaginosis   Your blood sugar in office is 344, this is high, I have sent you a glucometer for home use to the pharmacy, if you have any issue getting the glucometer please reach out to your primary doctor so this can be reordered please follow-up with your doctor for further management of your diabetes  Use MetroGel every night before bed for 7 days  Bacterial vaginosis which results from an overgrowth of one on several organisms that are normally present in your vagina. Vaginosis is an inflammation of the vagina that can result in discharge, itching and pain.  Labs pending 2-3 days, you will be contacted if positive for any sti and treatment will be sent to the pharmacy, you will have to return to the clinic if positive for gonorrhea to receive treatment   Please refrain from having sex until labs results, if positive please refrain from having sex until treatment complete and symptoms resolve   If positive for  Chlamydia  gonorrhea or trichomoniasis please notify partner or partners so they may tested as well  Moving forward, it is recommended you use some form of protection against the transmission of sti infections  such as condoms or dental dams with each sexual encounter     In addition: Avoid baths, hot tubs and whirlpool spas.  Don't use scented or harsh soaps Avoid irritants. These include scented tampons and pads. Wipe from front to back after using the toilet. Don't douche. Your vagina doesn't require cleansing other than normal bathing.  Use a condom.  Wear cotton underwear, this fabric absorbs some moisture.

## 2023-06-28 NOTE — ED Provider Notes (Incomplete)
[facility]    CSN: [id number] Arrival date & time: 06/28/23  1616      History   Chief Complaint No chief complaint on file.   HPI Stephanie Reid is a 32 y.o. female.   Used monistat 1 day after started havin g Stephanie Reid thin discharge with odor, then tried replinish, no treatment, and uropill, made worse,   No concrn for srtd, last encounte two months ago,   Past Medical History:  Diagnosis Date   Bacterial vaginosis    Diabetes mellitus without complication (HCC)    Herpes simplex    Hx of chlamydia infection    Hx of gonorrhea    Hx of trichomoniasis    Seasonal allergies     Patient Active Problem List   Diagnosis Date Noted   New onset type 2 diabetes mellitus (HCC) 07/21/2022   Dehydration 07/21/2022   Vaginal candidiasis 07/21/2022   Amenorrhea, secondary 10/06/2013    Past Surgical History:  Procedure Laterality Date   NO PAST SURGERIES      OB History     Gravida  0   Para  0   Term  0   Preterm  0   AB  0   Living  0      SAB  0   IAB  0   Ectopic  0   Multiple  0   Live Births  0            Home Medications    Prior to Admission medications   Medication Sig Start Date End Date Taking? Authorizing Provider  atorvastatin (LIPITOR) 20 MG tablet Take 1 tablet (20 mg total) by mouth daily. 12/20/22   Jeani Sow, MD  blood glucose meter kit and supplies Dispense based on patient and insurance preference. Use up to four times daily as directed. (FOR ICD-10 E10.9, E11.9). 07/22/22   Hongalgi, Maximino Greenland, MD  Continuous Blood Gluc Sensor (DEXCOM G7 SENSOR) MISC 1 each by Does not apply route every 14 (fourteen) days. Every 10 days. Not 14 12/19/22   Jeani Sow, MD  doxycycline (VIBRAMYCIN) 100 MG capsule Take 1 capsule (100 mg total) by mouth 2 (two) times daily. 03/26/23   Okey Zelek, Elita Boone, NP  fluconazole (DIFLUCAN) 150 MG tablet Take 1 tablet (150 mg total) by mouth daily. May take the second dose 72 hours after  the first if symptoms does not resolve 03/08/23   Durward Parcel, FNP  Insulin Pen Needle (PEN NEEDLES 3/16") 31G X 5 MM MISC Use twice daily as per instructions. 07/22/22   Hongalgi, Maximino Greenland, MD  metFORMIN (GLUCOPHAGE) 500 MG tablet Take 1 tablet (500 mg total) by mouth 2 (two) times daily with a meal. 12/17/22   Jeani Sow, MD  metroNIDAZOLE (FLAGYL) 500 MG tablet Take 1 tablet (500 mg total) by mouth 2 (two) times daily. 12/17/22   Merrilee Jansky, MD  NOVOLIN 70/30 KWIKPEN (70-30) 100 UNIT/ML KwikPen INJECT 20 UNITS SUBCUTANEOUSLY TWICE DAILY BEFORE A MEAL 04/06/23   Jeani Sow, MD  Semaglutide,0.25 or 0.5MG /DOS, 2 MG/3ML SOPN Inject 0.25 mg into the skin once a week. 12/17/22   Jeani Sow, MD  traMADol (ULTRAM) 50 MG tablet Take 1 tablet (50 mg total) by mouth every 6 (six) hours as needed. 03/26/23   Kerensa Nicklas, Elita Boone, NP  valACYclovir (VALTREX) 1000 MG tablet Take 1 tablet (1,000 mg total) by mouth 3 (three) times daily. 03/08/23   Avegno,  Zachery Dakins, FNP  promethazine (PHENERGAN) 12.5 MG tablet Take 1 tablet (12.5 mg total) by mouth every 6 (six) hours as needed for nausea or vomiting. 12/09/20 02/06/21  Milagros Loll, MD    Family History Family History  Problem Relation Age of Onset   Diabetes Mother    Hypertension Mother    Heart disease Mother        pacemaker   Stroke Mother 34   Heart attack Mother 66   Diabetes Father    Hypertension Maternal Grandmother    Diabetes Maternal Grandmother     Social History Social History   Tobacco Use   Smoking status: Every Day    Current packs/day: 0.10    Average packs/day: 0.1 packs/day for 15.0 years (1.5 ttl pk-yrs)    Types: Cigarettes   Smokeless tobacco: Never   Tobacco comments:    2 cigs per day   Vaping Use   Vaping status: Never Used  Substance Use Topics   Alcohol use: Yes    Alcohol/week: 4.0 standard drinks of alcohol    Types: 4 Glasses of wine per week    Comment: wine qod and liquor on  wekend   Drug use: Not Currently    Types: Marijuana     Allergies   Apple juice, Banana, Orange fruit [citrus], Lactase-lactobacillus, Spinach, Vicodin [hydrocodone-acetaminophen], and Penicillin g   Review of Systems Review of Systems   Physical Exam Triage Vital Signs ED Triage Vitals  Encounter Vitals Group     BP 06/28/23 1716 114/77     Systolic BP Percentile --      Diastolic BP Percentile --      Pulse Rate 06/28/23 1716 71     Resp 06/28/23 1716 18     Temp 06/28/23 1716 98.7 F (37.1 C)     Temp Source 06/28/23 1716 Oral     SpO2 06/28/23 1716 97 %     Weight --      Height --      Head Circumference --      Peak Flow --      Pain Score 06/28/23 1720 0     Pain Loc --      Pain Education --      Exclude from Growth Chart --    No data found.  Updated Vital Signs BP 114/77 (BP Location: Left Arm)   Pulse 71   Temp 98.7 F (37.1 C) (Oral)   Resp 18   LMP 12/04/2009 Comment: pt got off of birth control and never got her period again.  SpO2 97%   Visual Acuity Right Eye Distance:   Left Eye Distance:   Bilateral Distance:    Right Eye Near:   Left Eye Near:    Bilateral Near:     Physical Exam   UC Treatments / Results  Labs (all labs ordered are listed, but only abnormal results are displayed) Labs Reviewed  POCT FASTING CBG [facility] MANUAL ENTRY - Abnormal; Notable for the following components:      Result Value   POCT Glucose ([facility]) 344 (*)    All other components within normal limits  CERVICOVAGINAL ANCILLARY ONLY    EKG   Radiology No results found.  Procedures Procedures (including critical care time)  Medications Ordered in UC Medications - No data to display  Initial Impression / Assessment and Plan / UC Course  I have reviewed the triage vital signs and the nursing notes.  Pertinent labs & imaging  results that were available during my care of the patient were reviewed by me and considered in my medical decision making (see  chart for details).     *** Final Clinical Impressions(s) / UC Diagnoses   Final diagnoses:  Vaginal discharge     Discharge Instructions      Today you are being treated prophylactically for  Bacterial vaginosis   Your blood sugar in office is 344, this is high, I have sent you a glucometer for home use to the pharmacy, if you have any issue getting the glucometer please reach out to your primary doctor so this can be reordered please follow-up with your doctor for further management of your diabetes  Take Metronidazole 500 mg twice a day for 7 days, do not drink alcohol while using medication, this will make you feel sick   Bacterial vaginosis which results from an overgrowth of one on several organisms that are normally present in your vagina. Vaginosis is an inflammation of the vagina that can result in discharge, itching and pain.  Labs pending 2-3 days, you will be contacted if positive for any sti and treatment will be sent to the pharmacy, you will have to return to the clinic if positive for gonorrhea to receive treatment   Please refrain from having sex until labs results, if positive please refrain from having sex until treatment complete and symptoms resolve   If positive for HIV, Syphilis, Chlamydia  gonorrhea or trichomoniasis please notify partner or partners so they may tested as well  Moving forward, it is recommended you use some form of protection against the transmission of sti infections  such as condoms or dental dams with each sexual encounter     In addition: Avoid baths, hot tubs and whirlpool spas.  Don't use scented or harsh soaps Avoid irritants. These include scented tampons and pads. Wipe from front to back after using the toilet. Don't douche. Your vagina doesn't require cleansing other than normal bathing.  Use a condom.  Wear cotton underwear, this fabric absorbs some moisture.        ED Prescriptions   None    PDMP not reviewed this  encounter.

## 2023-06-28 NOTE — ED Triage Notes (Signed)
Pt reports she thought she had a yeast infection so she took OTC a vaginal rinse for bacteria and monistat. She now has odor and milky white vaginal discharge x 2 weeks.   Last unprotected sexual partner x 2 months.  Pt also states she wants her blood sugar checked due to her  glucose machine breaking x 4 months. Pt states she does take her insulin but does not check her sugar.

## 2023-06-30 LAB — CERVICOVAGINAL ANCILLARY ONLY
Bacterial Vaginitis (gardnerella): POSITIVE — AB
Candida Glabrata: NEGATIVE
Candida Vaginitis: NEGATIVE
Chlamydia: NEGATIVE
Comment: NEGATIVE
Comment: NEGATIVE
Comment: NEGATIVE
Comment: NEGATIVE
Comment: NEGATIVE
Comment: NORMAL
Neisseria Gonorrhea: NEGATIVE
Trichomonas: NEGATIVE

## 2023-07-15 ENCOUNTER — Other Ambulatory Visit: Payer: Self-pay | Admitting: Family Medicine

## 2023-07-15 NOTE — Telephone Encounter (Signed)
LVM to schedule appointment.

## 2023-07-15 NOTE — Telephone Encounter (Signed)
Needs appt

## 2023-07-18 ENCOUNTER — Ambulatory Visit
Admission: EM | Admit: 2023-07-18 | Discharge: 2023-07-18 | Disposition: A | Discharge status: Home / Self Care | Payer: Self-pay | Attending: Internal Medicine | Admitting: Internal Medicine

## 2023-07-18 DIAGNOSIS — B3731 Acute candidiasis of vulva and vagina: Secondary | ICD-10-CM | POA: Insufficient documentation

## 2023-07-18 DIAGNOSIS — N898 Other specified noninflammatory disorders of vagina: Secondary | ICD-10-CM | POA: Insufficient documentation

## 2023-07-18 MED ORDER — FLUCONAZOLE 150 MG PO TABS: 150.0000 mg | ORAL_TABLET | ORAL | 0 refills | Status: DC

## 2023-07-18 NOTE — ED Triage Notes (Signed)
Pt reports vaginal discharge and irritation. Pt states she is diabetic. Pt think she has yeast infection.

## 2023-07-18 NOTE — Discharge Instructions (Signed)
I have sent you Diflucan to treat suspicion of vaginal yeast infection.  Vaginal swab is pending.  Will call with results.  Please follow-up with gynecology if symptoms persist or worsen.

## 2023-07-18 NOTE — ED Provider Notes (Signed)
[facility]    CSN: [id number] Arrival date & time: 07/18/23  0909      History   Chief Complaint Chief Complaint  Patient presents with   Vaginal Discharge    HPI Kamirra Jaskowiak Nordmark is a 32 y.o. female.   Patient presents with new onset vaginal irritation that started a few days ago.  Denies that she has any vaginal discharge or any other associated symptoms.  Patient presented on 8/3 and tested positive for bacterial vaginosis but reports those symptoms have resolved.  Reports that she is not sexually active so has no concern for STD.  Last menstrual cycle was approximately 10 years ago but patient reports this is baseline for her.  Is concerned for vaginal yeast infection given history of diabetes.  States that this feels similar to yeast infections in the past.  She was sent order for glucose monitor at previous visit on 8/3 and reports that she has been using this.  Blood glucose has been in the 300s but she has not yet scheduled an appointment with PCP.  Reports that she has been taking her medication as prescribed.   Vaginal Discharge   Past Medical History:  Diagnosis Date   Bacterial vaginosis    Diabetes mellitus without complication (HCC)    Herpes simplex    Hx of chlamydia infection    Hx of gonorrhea    Hx of trichomoniasis    Seasonal allergies     Patient Active Problem List   Diagnosis Date Noted   New onset type 2 diabetes mellitus (HCC) 07/21/2022   Dehydration 07/21/2022   Vaginal candidiasis 07/21/2022   Amenorrhea, secondary 10/06/2013    Past Surgical History:  Procedure Laterality Date   NO PAST SURGERIES      OB History     Gravida  0   Para  0   Term  0   Preterm  0   AB  0   Living  0      SAB  0   IAB  0   Ectopic  0   Multiple  0   Live Births  0            Home Medications    Prior to Admission medications   Medication Sig Start Date End Date Taking? Authorizing Provider  atorvastatin  (LIPITOR) 20 MG tablet Take 1 tablet (20 mg total) by mouth daily. 12/20/22   Jeani Sow, MD  blood glucose meter kit and supplies Dispense based on patient and insurance preference. Use up to four times daily as directed. (FOR ICD-10 E10.9, E11.9). 07/22/22  Yes Hongalgi, Maximino Greenland, MD  Blood Glucose Monitoring Suppl DEVI 1 each by Does not apply route in the morning, at noon, and at bedtime. May substitute to any manufacturer covered by patient's insurance. 06/28/23  Yes White, Adrienne R, NP  Continuous Blood Gluc Sensor (DEXCOM G7 SENSOR) MISC 1 each by Does not apply route every 14 (fourteen) days. Every 10 days. Not 14 12/19/22  Yes Jeani Sow, MD  fluconazole (DIFLUCAN) 150 MG tablet Take 1 tablet (150 mg total) by mouth every 3 (three) days. 07/18/23  Yes Gage Treiber, Rolly Salter E, FNP  Glucose Blood (BLOOD GLUCOSE TEST STRIPS) STRP 1 each by In Vitro route daily as needed. May substitute to any manufacturer covered by patient's insurance. Give cheap kit. No insurance 06/28/23 07/28/23 Yes White, Hansel Starling R, NP  Insulin Pen Needle (PEN NEEDLES 3/16") 31G X 5 MM MISC  Use twice daily as per instructions. 07/22/22  Yes Hongalgi, Maximino Greenland, MD  Lancet Device MISC 1 each by Does not apply route in the morning, at noon, and at bedtime. May substitute to any manufacturer covered by patient's insurance. 06/28/23 07/28/23 Yes White, Elita Boone, NP  Lancets Misc. MISC 1 each by Does not apply route in the morning, at noon, and at bedtime. May substitute to any manufacturer covered by patient's insurance. 06/28/23 07/28/23 Yes White, Elita Boone, NP  NOVOLIN 70/30 KWIKPEN (70-30) 100 UNIT/ML KwikPen INJECT 20 UNITS SUBCUTANEOUSLY TWICE DAILY BEFORE A MEAL 07/15/23  Yes Jeani Sow, MD  Semaglutide,0.25 or 0.5MG /DOS, 2 MG/3ML SOPN Inject 0.25 mg into the skin once a week. 12/17/22  Yes Jeani Sow, MD  valACYclovir (VALTREX) 1000 MG tablet Take 1 tablet (1,000 mg total) by mouth 3 (three) times daily. 03/08/23  Yes Avegno,  Zachery Dakins, FNP  doxycycline (VIBRAMYCIN) 100 MG capsule Take 1 capsule (100 mg total) by mouth 2 (two) times daily. 03/26/23   Valinda Hoar, NP  metFORMIN (GLUCOPHAGE) 500 MG tablet Take 1 tablet (500 mg total) by mouth 2 (two) times daily with a meal. 12/17/22   Jeani Sow, MD  metroNIDAZOLE (FLAGYL) 500 MG tablet Take 1 tablet (500 mg total) by mouth 2 (two) times daily. 12/17/22   LampteyBritta Mccreedy, MD  metroNIDAZOLE (METROGEL) 0.75 % vaginal gel Place 1 Applicatorful vaginally 2 (two) times daily. 06/28/23   White, Elita Boone, NP  traMADol (ULTRAM) 50 MG tablet Take 1 tablet (50 mg total) by mouth every 6 (six) hours as needed. 03/26/23   White, Elita Boone, NP  promethazine (PHENERGAN) 12.5 MG tablet Take 1 tablet (12.5 mg total) by mouth every 6 (six) hours as needed for nausea or vomiting. 12/09/20 02/06/21  Milagros Loll, MD    Family History Family History  Problem Relation Age of Onset   Diabetes Mother    Hypertension Mother    Heart disease Mother        pacemaker   Stroke Mother 79   Heart attack Mother 35   Diabetes Father    Hypertension Maternal Grandmother    Diabetes Maternal Grandmother     Social History Social History   Tobacco Use   Smoking status: Every Day    Current packs/day: 0.10    Average packs/day: 0.1 packs/day for 15.0 years (1.5 ttl pk-yrs)    Types: Cigarettes   Smokeless tobacco: Never   Tobacco comments:    2 cigs per day   Vaping Use   Vaping status: Never Used  Substance Use Topics   Alcohol use: Yes    Alcohol/week: 4.0 standard drinks of alcohol    Types: 4 Glasses of wine per week    Comment: wine qod and liquor on wekend   Drug use: Not Currently    Types: Marijuana     Allergies   Apple juice, Banana, Orange fruit [citrus], Lactase-lactobacillus, Spinach, Vicodin [hydrocodone-acetaminophen], and Penicillin g   Review of Systems Review of Systems Per HPI  Physical Exam Triage Vital Signs ED Triage Vitals [07/18/23  0914]  Encounter Vitals Group     BP (!) 110/57     Systolic BP Percentile      Diastolic BP Percentile      Pulse Rate 71     Resp 16     Temp 97.6 F (36.4 C)     Temp Source Oral     SpO2 97 %  Weight      Height      Head Circumference      Peak Flow      Pain Score      Pain Loc      Pain Education      Exclude from Growth Chart    No data found.  Updated Vital Signs BP (!) 110/57 (BP Location: Left Arm)   Pulse 71   Temp 97.6 F (36.4 C) (Oral)   Resp 16   LMP 12/04/2009 Comment: pt got off of birth control and never got her period again.  SpO2 97%   Visual Acuity Right Eye Distance:   Left Eye Distance:   Bilateral Distance:    Right Eye Near:   Left Eye Near:    Bilateral Near:     Physical Exam Constitutional:      General: She is not in acute distress.    Appearance: Normal appearance. She is not toxic-appearing or diaphoretic.  HENT:     Head: Normocephalic and atraumatic.  Eyes:     Extraocular Movements: Extraocular movements intact.     Conjunctiva/sclera: Conjunctivae normal.  Pulmonary:     Effort: Pulmonary effort is normal.  Genitourinary:    Comments: Deferred with shared decision making.  Self swab performed. Neurological:     General: No focal deficit present.     Mental Status: She is alert and oriented to person, place, and time. Mental status is at baseline.  Psychiatric:        Mood and Affect: Mood normal.        Behavior: Behavior normal.        Thought Content: Thought content normal.        Judgment: Judgment normal.      UC Treatments / Results  Labs (all labs ordered are listed, but only abnormal results are displayed) Labs Reviewed  CERVICOVAGINAL ANCILLARY ONLY    EKG   Radiology No results found.  Procedures Procedures (including critical care time)  Medications Ordered in UC Medications - No data to display  Initial Impression / Assessment and Plan / UC Course  I have reviewed the triage vital  signs and the nursing notes.  Pertinent labs & imaging results that were available during my care of the patient were reviewed by me and considered in my medical decision making (see chart for details).     Will opt to treat for vaginal yeast infection given history of diabetes with Diflucan today.  Cervicovaginal swab pending to test for BV and yeast as patient reports that she is not sexually active so will defer STD testing.  Patient is monitoring glucose and blood blood sugars have been high which is causing suspicion for yeast infection.  Encouraged patient to follow-up with PCP as soon as possible for further evaluation and management with this.  Advised strict return precautions.  Patient verbalized understanding and was agreeable with plan. Final Clinical Impressions(s) / UC Diagnoses   Final diagnoses:  Vaginal candidiasis  Vaginal irritation     Discharge Instructions      I have sent you Diflucan to treat suspicion of vaginal yeast infection.  Vaginal swab is pending.  Will call with results.  Please follow-up with gynecology if symptoms persist or worsen.    ED Prescriptions     Medication Sig Dispense Auth. Provider   fluconazole (DIFLUCAN) 150 MG tablet Take 1 tablet (150 mg total) by mouth every 3 (three) days. 2 tablet McAlester, Acie Fredrickson, [place]  PDMP not reviewed this encounter.   Gustavus Bryant, [place] 07/18/23 [phone]

## 2023-07-21 LAB — CERVICOVAGINAL ANCILLARY ONLY
Bacterial Vaginitis (gardnerella): NEGATIVE
Candida Glabrata: POSITIVE — AB
Candida Vaginitis: POSITIVE — AB
Comment: NEGATIVE
Comment: NEGATIVE
Comment: NEGATIVE

## 2023-07-27 ENCOUNTER — Emergency Department (HOSPITAL_COMMUNITY)
Admission: EM | Admit: 2023-07-27 | Discharge: 2023-07-28 | Disposition: A | Discharge status: Home / Self Care | Payer: Self-pay | Attending: Emergency Medicine | Admitting: Emergency Medicine

## 2023-07-27 ENCOUNTER — Encounter (HOSPITAL_COMMUNITY): Payer: Self-pay

## 2023-07-27 DIAGNOSIS — R102 Pelvic and perineal pain: Secondary | ICD-10-CM | POA: Insufficient documentation

## 2023-07-27 DIAGNOSIS — E119 Type 2 diabetes mellitus without complications: Secondary | ICD-10-CM | POA: Insufficient documentation

## 2023-07-27 DIAGNOSIS — F17210 Nicotine dependence, cigarettes, uncomplicated: Secondary | ICD-10-CM | POA: Insufficient documentation

## 2023-07-27 HISTORY — DX: Herpesviral infection, unspecified: B00.9

## 2023-07-27 NOTE — ED Triage Notes (Signed)
Pt states that she is having vaginal pain during intercourse, hx of herpes and think she is having an outbreak

## 2023-07-28 LAB — WET PREP, GENITAL
Clue Cells Wet Prep HPF POC: NONE SEEN
Sperm: NONE SEEN
Trich, Wet Prep: NONE SEEN
WBC, Wet Prep HPF POC: <10 (ref <10)
Yeast Wet Prep HPF POC: NONE SEEN

## 2023-07-28 MED ORDER — VALACYCLOVIR HCL 1 G PO TABS: 1000.0000 mg | ORAL_TABLET | Freq: Two times a day (BID) | ORAL | 0 refills | Status: AC

## 2023-07-28 NOTE — Discharge Instructions (Signed)
You were evaluated in the Emergency Department and after careful evaluation, we did not find any emergent condition requiring admission or further testing in the hospital.  Your exam/testing today is overall reassuring.  Symptoms may be due to herpes outbreak.  Take the valacyclovir medication as prescribed, continue follow-up with your OB/GYN doctors.  Please return to the Emergency Department if you experience any worsening of your condition.   Thank you for allowing [place] to be a part of your care.

## 2023-07-28 NOTE — ED Provider Notes (Signed)
[facility] Emergency Department Provider Note MRN:  [id number]  Arrival date & time: 07/28/23     Chief Complaint   Vaginal Pain   History of Present Illness   Stephanie Reid is a 32 y.o. year-old female with a history of diabetes presenting to the ED with chief complaint of vaginal pain.  Pain with intercourse for the past 2 days.  Sharp vaginal pains.  Also thinks she may be breaking out in herpes.  Has only had 1 other outbreak 2 years ago.  No fever, no abdominal pain.  Review of Systems  A thorough review of systems was obtained and all systems are negative except as noted in the HPI and PMH.   Patient's Health History    Past Medical History:  Diagnosis Date   Bacterial vaginosis    Diabetes mellitus without complication (HCC)    Herpes    Herpes simplex    Hx of chlamydia infection    Hx of gonorrhea    Hx of trichomoniasis    Seasonal allergies     Past Surgical History:  Procedure Laterality Date   NO PAST SURGERIES      Family History  Problem Relation Age of Onset   Diabetes Mother    Hypertension Mother    Heart disease Mother        pacemaker   Stroke Mother 51   Heart attack Mother 62   Diabetes Father    Hypertension Maternal Grandmother    Diabetes Maternal Grandmother     Social History   Socioeconomic History   Marital status: Single    Spouse name: Not on file   Number of children: 0   Years of education: Not on file   Highest education level: Not on file  Occupational History   Occupation: Event organiser: [employer]  Tobacco Use   Smoking status: Every Day    Current packs/day: 0.10    Average packs/day: 0.1 packs/day for 15.0 years (1.5 ttl pk-yrs)    Types: Cigarettes   Smokeless tobacco: Never   Tobacco comments:    2 cigs per day   Vaping Use   Vaping status: Never Used  Substance and Sexual Activity   Alcohol use: Yes    Alcohol/week: 4.0 standard drinks of alcohol    Types: 4 Glasses of  wine per week    Comment: wine qod and liquor on wekend   Drug use: Not Currently    Types: Marijuana   Sexual activity: Yes    Birth control/protection: Condom  Other Topics Concern   Not on file  Social History Narrative   Not on file   Social Determinants of Health   Financial Resource Strain: Not on file  Food Insecurity: No Food Insecurity (04/29/2019)   Hunger Vital Sign    Worried About Running Out of Food in the Last Year: Never true    Ran Out of Food in the Last Year: Never true  Transportation Needs: No Transportation Needs (04/29/2019)   PRAPARE - Administrator, Civil Service (Medical): No    Lack of Transportation (Non-Medical): No  Physical Activity: Not on file  Stress: Not on file  Social Connections: Not on file  Intimate Partner Violence: Not on file     Physical Exam   Vitals:   07/27/23 2238  BP: 117/72  Pulse: 88  Resp: 20  Temp: 98.4 F (36.9 C)  SpO2: 99%    CONSTITUTIONAL: Well-appearing, NAD  NEURO/PSYCH:  Alert and oriented x 3, no focal deficits EYES:  eyes equal and reactive ENT/NECK:  no LAD, no JVD CARDIO: Regular rate, well-perfused, normal S1 and S2 PULM:  CTAB no wheezing or rhonchi GI/GU:  non-distended, non-tender MSK/SPINE:  No gross deformities, no edema SKIN:  no rash, atraumatic   *Additional and/or pertinent findings included in MDM below  Diagnostic and Interventional Summary    EKG Interpretation Date/Time:    Ventricular Rate:    PR Interval:    QRS Duration:    QT Interval:    QTC Calculation:   R Axis:      Text Interpretation:         Labs Reviewed  WET PREP, GENITAL  GC/CHLAMYDIA PROBE AMP ([place]) NOT AT [facility]    No orders to display    Medications - No data to display   Procedures  /  Critical Care Procedures  ED Course and Medical Decision Making  Initial Impression and Ddx Suspect herpes outbreak, pelvic exam to follow.  Past medical/surgical history that increases  complexity of ED encounter: None  Interpretation of Diagnostics Wet prep unremarkable  Patient Reassessment and Ultimate Disposition/Management     Pelvic exam largely unremarkable with no lymphadenopathy, small area of tenderness to the left inferior labia minora but no obvious rash or fluctuance.  No obvious abnormalities on internal pelvic exam.  Clinically this is suspicious for herpes flare, providing valacyclovir and patient encouraged to follow-up with OB/GYN.  Patient management required discussion with the following services or consulting groups:  None  Complexity of Problems Addressed Acute complicated illness or Injury  Additional Data Reviewed and Analyzed Further history obtained from: None  Additional Factors Impacting ED Encounter Risk Prescriptions  [name], MD [facility] Emergency Medicine [facility] [email or web address]  Final Clinical Impressions(s) / ED Diagnoses     ICD-10-CM   1. Vaginal pain  R10.2       ED Discharge Orders          Ordered    valACYclovir (VALTREX) 1000 MG tablet  2 times daily        07/28/23 0228             Discharge Instructions Discussed with and Provided to Patient:     Discharge Instructions      You were evaluated in the Emergency Department and after careful evaluation, we did not find any emergent condition requiring admission or further testing in the hospital.  Your exam/testing today is overall reassuring.  Symptoms may be due to herpes outbreak.  Take the valacyclovir medication as prescribed, continue follow-up with your OB/GYN doctors.  Please return to the Emergency Department if you experience any worsening of your condition.   Thank you for allowing [place] to be a part of your care.       [name], MD 07/28/23 [phone]

## 2023-10-08 ENCOUNTER — Ambulatory Visit
Admission: EM | Admit: 2023-10-08 | Discharge: 2023-10-08 | Disposition: A | Discharge status: Home / Self Care | Payer: Self-pay | Attending: Physician Assistant | Admitting: Physician Assistant

## 2023-10-08 DIAGNOSIS — N760 Acute vaginitis: Secondary | ICD-10-CM | POA: Insufficient documentation

## 2023-10-08 DIAGNOSIS — B9689 Other specified bacterial agents as the cause of diseases classified elsewhere: Secondary | ICD-10-CM | POA: Insufficient documentation

## 2023-10-08 MED ORDER — METRONIDAZOLE 500 MG PO TABS: 500.0000 mg | ORAL_TABLET | Freq: Two times a day (BID) | ORAL | 0 refills | Status: AC

## 2023-10-08 NOTE — Discharge Instructions (Addendum)
Return if problems

## 2023-10-08 NOTE — ED Triage Notes (Signed)
"  I want to get check to see If I have a yeast infection or STI". Current symptoms include "vaginal discharge starting about 3 days ago". No painful urination. No rash. No fever. No abd pain. No nausea.

## 2023-10-09 ENCOUNTER — Telehealth: Payer: Self-pay

## 2023-10-09 LAB — CERVICOVAGINAL ANCILLARY ONLY
Bacterial Vaginitis (gardnerella): POSITIVE — AB
Candida Glabrata: NEGATIVE
Candida Vaginitis: POSITIVE — AB
Chlamydia: NEGATIVE
Comment: NEGATIVE
Comment: NEGATIVE
Comment: NEGATIVE
Comment: NEGATIVE
Comment: NEGATIVE
Comment: NORMAL
Neisseria Gonorrhea: NEGATIVE
Trichomonas: NEGATIVE

## 2023-10-09 MED ORDER — FLUCONAZOLE 150 MG PO TABS: 150.0000 mg | ORAL_TABLET | Freq: Once | ORAL | 0 refills | Status: AC

## 2023-10-09 NOTE — Telephone Encounter (Signed)
Per protocol, pt requires tx with Diflucan.  Rx sent to pharmacy on file.

## 2023-10-11 NOTE — ED Provider Notes (Signed)
[facility]    CSN: [id number] Arrival date & time: 10/08/23  4098      History   Chief Complaint Chief Complaint  Patient presents with   Vaginitis   SEXUALLY TRANSMITTED DISEASE    Testing    HPI Stephanie Reid is a 32 y.o. female.   Patient request testing for a vaginal discharge.  Patient is concerned that she could have BV.  Patient denies any fever or chills she denies any nausea or vomiting.  She is not having any abdominal pain.  Patient reports partner has not had any symptoms.     Past Medical History:  Diagnosis Date   Bacterial vaginosis    Diabetes mellitus without complication (HCC)    Herpes    Herpes simplex    Hx of chlamydia infection    Hx of gonorrhea    Hx of trichomoniasis    Seasonal allergies     Patient Active Problem List   Diagnosis Date Noted   New onset type 2 diabetes mellitus (HCC) 07/21/2022   Dehydration 07/21/2022   Vaginal candidiasis 07/21/2022   Amenorrhea, secondary 10/06/2013    Past Surgical History:  Procedure Laterality Date   NO PAST SURGERIES      OB History     Gravida  0   Para  0   Term  0   Preterm  0   AB  0   Living  0      SAB  0   IAB  0   Ectopic  0   Multiple  0   Live Births  0            Home Medications    Prior to Admission medications   Medication Sig Start Date End Date Taking? Authorizing Provider  atorvastatin (LIPITOR) 20 MG tablet Take 1 tablet (20 mg total) by mouth daily. 12/20/22   Jeani Sow, MD  metroNIDAZOLE (FLAGYL) 500 MG tablet Take 1 tablet (500 mg total) by mouth 2 (two) times daily for 7 days. 10/08/23 10/15/23 Yes Elson Areas, PA-C  blood glucose meter kit and supplies Dispense based on patient and insurance preference. Use up to four times daily as directed. (FOR ICD-10 E10.9, E11.9). 07/22/22   Hongalgi, Maximino Greenland, MD  Blood Glucose Monitoring Suppl DEVI 1 each by Does not apply route in the morning, at noon, and at bedtime.  May substitute to any manufacturer covered by patient's insurance. 06/28/23   White, Elita Boone, NP  Continuous Blood Gluc Sensor (DEXCOM G7 SENSOR) MISC 1 each by Does not apply route every 14 (fourteen) days. Every 10 days. Not 14 12/19/22   Jeani Sow, MD  doxycycline (VIBRAMYCIN) 100 MG capsule Take 1 capsule (100 mg total) by mouth 2 (two) times daily. 03/26/23   White, Elita Boone, NP  fluconazole (DIFLUCAN) 150 MG tablet Take 1 tablet (150 mg total) by mouth every 3 (three) days. 07/18/23   Gustavus Bryant, FNP  Insulin Pen Needle (PEN NEEDLES 3/16") 31G X 5 MM MISC Use twice daily as per instructions. 07/22/22   Hongalgi, Maximino Greenland, MD  metFORMIN (GLUCOPHAGE) 500 MG tablet Take 1 tablet (500 mg total) by mouth 2 (two) times daily with a meal. 12/17/22   Jeani Sow, MD  metroNIDAZOLE (METROGEL) 0.75 % vaginal gel Place 1 Applicatorful vaginally 2 (two) times daily. 06/28/23   White, Adrienne R, NP  NOVOLIN 70/30 KWIKPEN (70-30) 100 UNIT/ML KwikPen INJECT 20 UNITS SUBCUTANEOUSLY TWICE  DAILY BEFORE A MEAL 07/15/23   Jeani Sow, MD  Semaglutide,0.25 or 0.5MG /DOS, 2 MG/3ML SOPN Inject 0.25 mg into the skin once a week. 12/17/22   Jeani Sow, MD  traMADol (ULTRAM) 50 MG tablet Take 1 tablet (50 mg total) by mouth every 6 (six) hours as needed. 03/26/23   White, Elita Boone, NP  promethazine (PHENERGAN) 12.5 MG tablet Take 1 tablet (12.5 mg total) by mouth every 6 (six) hours as needed for nausea or vomiting. 12/09/20 02/06/21  Milagros Loll, MD    Family History Family History  Problem Relation Age of Onset   Diabetes Mother    Hypertension Mother    Heart disease Mother        pacemaker   Stroke Mother 74   Heart attack Mother 35   Diabetes Father    Hypertension Maternal Grandmother    Diabetes Maternal Grandmother     Social History Social History   Tobacco Use   Smoking status: Every Day    Current packs/day: 0.10    Average packs/day: 0.1 packs/day for 15.0 years (1.5  ttl pk-yrs)    Types: Cigarettes   Smokeless tobacco: Never   Tobacco comments:    2 cigs per day   Vaping Use   Vaping status: Never Used  Substance Use Topics   Alcohol use: Yes    Alcohol/week: 4.0 standard drinks of alcohol    Types: 4 Glasses of wine per week    Comment: wine qod and liquor on wekend   Drug use: Yes    Types: Marijuana    Comment: rarely.     Allergies   Apple juice, Banana, Orange fruit [citrus], Bacid, Spinach, Vicodin [hydrocodone-acetaminophen], Hydrocodone-acetaminophen, Oxycodone-acetaminophen, and Penicillin g   Review of Systems Review of Systems  Genitourinary:  Positive for vaginal discharge.  All other systems reviewed and are negative.    Physical Exam Triage Vital Signs ED Triage Vitals  Encounter Vitals Group     BP 10/08/23 0858 116/73     Systolic BP Percentile --      Diastolic BP Percentile --      Pulse Rate 10/08/23 0858 78     Resp 10/08/23 0858 16     Temp 10/08/23 0858 98.2 F (36.8 C)     Temp Source 10/08/23 0858 Oral     SpO2 10/08/23 0858 98 %     Weight 10/08/23 0856 178 lb (80.7 kg)     Height 10/08/23 0856 5' 5.5" (1.664 m)     Head Circumference --      Peak Flow --      Pain Score 10/08/23 0853 0     Pain Loc --      Pain Education --      Exclude from Growth Chart --    No data found.  Updated Vital Signs BP 116/73 (BP Location: Left Arm)   Pulse 78   Temp 98.2 F (36.8 C) (Oral)   Resp 16   Ht 5' 5.5" (1.664 m)   Wt 80.7 kg   LMP  (Exact Date) Comment: No Menses. Last was about: 2011  SpO2 98%   BMI 29.17 kg/m   Visual Acuity Right Eye Distance:   Left Eye Distance:   Bilateral Distance:    Right Eye Near:   Left Eye Near:    Bilateral Near:     Physical Exam Vitals and nursing note reviewed.  Constitutional:      Appearance: She is well-developed.  HENT:     Head: Normocephalic.  Cardiovascular:     Rate and Rhythm: Normal rate.  Pulmonary:     Effort: Pulmonary effort is  normal.  Abdominal:     General: There is no distension.  Musculoskeletal:        General: Normal range of motion.     Cervical back: Normal range of motion.  Skin:    General: Skin is warm.  Neurological:     General: No focal deficit present.     Mental Status: She is alert and oriented to person, place, and time.      UC Treatments / Results  Labs (all labs ordered are listed, but only abnormal results are displayed) Labs Reviewed  CERVICOVAGINAL ANCILLARY ONLY - Abnormal; Notable for the following components:      Result Value   Bacterial Vaginitis (gardnerella) Positive (*)    Candida Vaginitis Positive (*)    All other components within normal limits    EKG   Radiology No results found.  Procedures Procedures (including critical care time)  Medications Ordered in UC Medications - No data to display  Initial Impression / Assessment and Plan / UC Course  I have reviewed the triage vital signs and the nursing notes.  Pertinent labs & imaging results that were available during my care of the patient were reviewed by me and considered in my medical decision making (see chart for details).     Patient most likely has BV given symptoms.  ED testing pending. Final Clinical Impressions(s) / UC Diagnoses   Final diagnoses:  Bacterial vaginitis     Discharge Instructions      Return if problems.     ED Prescriptions     Medication Sig Dispense Auth. Provider   metroNIDAZOLE (FLAGYL) 500 MG tablet Take 1 tablet (500 mg total) by mouth 2 (two) times daily for 7 days. 14 tablet Elson Areas, [place]      PDMP not reviewed this encounter. An After Visit Summary was printed and given to the patient.       Elson Areas, [place] 10/11/23 1140

## 2023-11-04 ENCOUNTER — Other Ambulatory Visit: Payer: Self-pay | Admitting: Family Medicine

## 2023-11-08 ENCOUNTER — Ambulatory Visit (HOSPITAL_COMMUNITY)
Admission: EM | Admit: 2023-11-08 | Discharge: 2023-11-08 | Disposition: A | Discharge status: Home / Self Care | Payer: Self-pay | Attending: Internal Medicine | Admitting: Internal Medicine

## 2023-11-08 ENCOUNTER — Encounter (HOSPITAL_COMMUNITY): Payer: Self-pay

## 2023-11-08 DIAGNOSIS — H01004 Unspecified blepharitis left upper eyelid: Secondary | ICD-10-CM

## 2023-11-08 DIAGNOSIS — A6004 Herpesviral vulvovaginitis: Secondary | ICD-10-CM

## 2023-11-08 MED ORDER — VALACYCLOVIR HCL 1 G PO TABS: 500.0000 mg | ORAL_TABLET | Freq: Two times a day (BID) | ORAL | 0 refills | Status: AC

## 2023-11-08 MED ORDER — ERYTHROMYCIN 5 MG/GM OP OINT: TOPICAL_OINTMENT | OPHTHALMIC | 0 refills | Status: DC

## 2023-11-08 NOTE — ED Provider Notes (Signed)
[facility]    CSN: [id number] Arrival date & time: 11/08/23  1530      History   Chief Complaint Chief Complaint  Patient presents with   Eye Swelling     HPI Stephanie Reid is a 32 y.o. female.   32 year old female who presents to urgent care with complaints of a swollen area on the left upper eyelid.  This has been present for 2 weeks.  It has gotten worse over the last 2 weeks.  It did almost go away at 1 point after she put an antibacterial ointment on it but came back when she stopped putting it on.  This is causing vision issues as it is swollen over top.  She is able to see out of her eye it is just that the area drops into her visual field.  She also relates that she currently has a genital herpes outbreak and would like medication to help treat this.  She is having significant irritation in the vaginal region from this.  She has had this in the past.     Past Medical History:  Diagnosis Date   Bacterial vaginosis    Diabetes mellitus without complication (HCC)    Herpes    Herpes simplex    Hx of chlamydia infection    Hx of gonorrhea    Hx of trichomoniasis    Seasonal allergies     Patient Active Problem List   Diagnosis Date Noted   New onset type 2 diabetes mellitus (HCC) 07/21/2022   Dehydration 07/21/2022   Vaginal candidiasis 07/21/2022   Amenorrhea, secondary 10/06/2013    Past Surgical History:  Procedure Laterality Date   NO PAST SURGERIES      OB History     Gravida  0   Para  0   Term  0   Preterm  0   AB  0   Living  0      SAB  0   IAB  0   Ectopic  0   Multiple  0   Live Births  0            Home Medications    Prior to Admission medications   Medication Sig Start Date End Date Taking? Authorizing Provider  atorvastatin (LIPITOR) 20 MG tablet Take 1 tablet (20 mg total) by mouth daily. 12/20/22   Jeani Sow, MD  erythromycin ophthalmic ointment Place a 1/2 inch ribbon of ointment into  the lower eyelid twice daily 11/08/23  Yes Charrie Mcconnon A, PA-C  valACYclovir (VALTREX) 1000 MG tablet Take 0.5 tablets (500 mg total) by mouth 2 (two) times daily for 5 days. 11/08/23 11/13/23 Yes Dezerae Freiberger A, PA-C  blood glucose meter kit and supplies Dispense based on patient and insurance preference. Use up to four times daily as directed. (FOR ICD-10 E10.9, E11.9). 07/22/22   Hongalgi, Maximino Greenland, MD  Blood Glucose Monitoring Suppl DEVI 1 each by Does not apply route in the morning, at noon, and at bedtime. May substitute to any manufacturer covered by patient's insurance. 06/28/23   Zenora Karpel, Elita Boone, NP  Continuous Blood Gluc Sensor (DEXCOM G7 SENSOR) MISC 1 each by Does not apply route every 14 (fourteen) days. Every 10 days. Not 14 12/19/22   Jeani Sow, MD  doxycycline (VIBRAMYCIN) 100 MG capsule Take 1 capsule (100 mg total) by mouth 2 (two) times daily. 03/26/23   Valinda Hoar, NP  fluconazole (DIFLUCAN) 150 MG tablet Take  1 tablet (150 mg total) by mouth every 3 (three) days. 07/18/23   Gustavus Bryant, FNP  Insulin Pen Needle (PEN NEEDLES 3/16") 31G X 5 MM MISC Use twice daily as per instructions. 07/22/22   Hongalgi, Maximino Greenland, MD  metFORMIN (GLUCOPHAGE) 500 MG tablet Take 1 tablet (500 mg total) by mouth 2 (two) times daily with a meal. 12/17/22   Jeani Sow, MD  metroNIDAZOLE (METROGEL) 0.75 % vaginal gel Place 1 Applicatorful vaginally 2 (two) times daily. 06/28/23   Valinda Hoar, NP  NOVOLIN 70/30 KWIKPEN (70-30) 100 UNIT/ML KwikPen INJECT 20 UNITS SUBCUTANEOUSLY TWICE DAILY BEFORE A MEAL 07/15/23   Jeani Sow, MD  Semaglutide,0.25 or 0.5MG /DOS, 2 MG/3ML SOPN Inject 0.25 mg into the skin once a week. 12/17/22   Jeani Sow, MD  traMADol (ULTRAM) 50 MG tablet Take 1 tablet (50 mg total) by mouth every 6 (six) hours as needed. 03/26/23   Josian Lanese, Elita Boone, NP  promethazine (PHENERGAN) 12.5 MG tablet Take 1 tablet (12.5 mg total) by mouth every 6 (six) hours as  needed for nausea or vomiting. 12/09/20 02/06/21  Milagros Loll, MD    Family History Family History  Problem Relation Age of Onset   Diabetes Mother    Hypertension Mother    Heart disease Mother        pacemaker   Stroke Mother 50   Heart attack Mother 45   Diabetes Father    Hypertension Maternal Grandmother    Diabetes Maternal Grandmother     Social History Social History   Tobacco Use   Smoking status: Every Day    Current packs/day: 0.10    Average packs/day: 0.1 packs/day for 15.0 years (1.5 ttl pk-yrs)    Types: Cigarettes   Smokeless tobacco: Never   Tobacco comments:    2 cigs per day   Vaping Use   Vaping status: Never Used  Substance Use Topics   Alcohol use: Yes    Alcohol/week: 4.0 standard drinks of alcohol    Types: 4 Glasses of wine per week    Comment: wine qod and liquor on wekend   Drug use: Yes    Types: Marijuana    Comment: rarely.     Allergies   Apple juice, Banana, Orange fruit [citrus], Bacid, Spinach, Vicodin [hydrocodone-acetaminophen], Hydrocodone-acetaminophen, Oxycodone-acetaminophen, and Penicillin g   Review of Systems Review of Systems  Constitutional:  Negative for chills and fever.  HENT:  Negative for ear pain and sore throat.   Eyes:  Positive for pain and visual disturbance.  Respiratory:  Negative for cough and shortness of breath.   Cardiovascular:  Negative for chest pain and palpitations.  Gastrointestinal:  Negative for abdominal pain and vomiting.  Genitourinary:  Positive for vaginal pain. Negative for dysuria and hematuria.  Musculoskeletal:  Negative for arthralgias and back pain.  Skin:  Negative for color change and rash.  Neurological:  Negative for seizures and syncope.  All other systems reviewed and are negative.    Physical Exam Triage Vital Signs ED Triage Vitals  Encounter Vitals Group     BP 11/08/23 1631 113/80     Systolic BP Percentile --      Diastolic BP Percentile --      Pulse  Rate 11/08/23 1631 82     Resp 11/08/23 1631 16     Temp 11/08/23 1631 98.2 F (36.8 C)     Temp Source 11/08/23 1631 Oral     SpO2  11/08/23 1631 99 %     Weight 11/08/23 1629 170 lb (77.1 kg)     Height 11/08/23 1629 5\' 5"  (1.651 m)     Head Circumference --      Peak Flow --      Pain Score 11/08/23 1629 0     Pain Loc --      Pain Education --      Exclude from Growth Chart --    No data found.  Updated Vital Signs BP 113/80 (BP Location: Left Arm)   Pulse 82   Temp 98.2 F (36.8 C) (Oral)   Resp 16   Ht 5\' 5"  (1.651 m)   Wt 170 lb (77.1 kg)   LMP  (Within Years)   SpO2 99%   BMI 28.29 kg/m   Visual Acuity Right Eye Distance:   Left Eye Distance:   Bilateral Distance:    Right Eye Near:   Left Eye Near:    Bilateral Near:     Physical Exam Vitals and nursing note reviewed.  Constitutional:      General: She is not in acute distress.    Appearance: She is well-developed.  HENT:     Head: Normocephalic and atraumatic.  Eyes:     Conjunctiva/sclera: Conjunctivae normal.   Cardiovascular:     Rate and Rhythm: Normal rate and regular rhythm.     Heart sounds: No murmur heard. Pulmonary:     Effort: Pulmonary effort is normal. No respiratory distress.     Breath sounds: Normal breath sounds.  Abdominal:     Palpations: Abdomen is soft.     Tenderness: There is no abdominal tenderness.  Musculoskeletal:        General: No swelling.     Cervical back: Neck supple.  Skin:    General: Skin is warm and dry.     Capillary Refill: Capillary refill takes less than 2 seconds.  Neurological:     Mental Status: She is alert.  Psychiatric:        Mood and Affect: Mood normal.      UC Treatments / Results  Labs (all labs ordered are listed, but only abnormal results are displayed) Labs Reviewed - No data to display  EKG   Radiology No results found.  Procedures Procedures (including critical care time)  Medications Ordered in UC Medications - No  data to display  Initial Impression / Assessment and Plan / UC Course  I have reviewed the triage vital signs and the nursing notes.  Pertinent labs & imaging results that were available during my care of the patient were reviewed by me and considered in my medical decision making (see chart for details).     Blepharitis of left upper eyelid, unspecified type  Herpes simplex vulvovaginitis   The area on your eye is called Blepharitis and This is an infection in the eyelid. We will treat with the following:  Erythromycin ointment twice daily to the area for 7 days. May use warm compresses on the eye Return to urgent care or PCP if symptoms worsen or fail to resolve.   For the herpes outbreak we will treat with the following:  Valacyclovir 500mg  twice daily for 5 days  Final Clinical Impressions(s) / UC Diagnoses   Final diagnoses:  Blepharitis of left upper eyelid, unspecified type  Herpes simplex vulvovaginitis     Discharge Instructions      The area on your eye is called Blepharitis and This is an infection  in the eyelid. We will treat with the following:  Erythromycin ointment twice daily to the area for 7 days. May use warm compresses on the eye Return to urgent care or PCP if symptoms worsen or fail to resolve.   For the herpes outbreak we will treat with the following:  Valacyclovir 500mg  twice daily for 5 days   ED Prescriptions     Medication Sig Dispense Auth. Provider   erythromycin ophthalmic ointment Place a 1/2 inch ribbon of ointment into the lower eyelid twice daily 3.5 g Huberta Tompkins A, PA-C   valACYclovir (VALTREX) 1000 MG tablet Take 0.5 tablets (500 mg total) by mouth 2 (two) times daily for 5 days. 5 tablet Landis Martins, [place]      PDMP not reviewed this encounter.   Landis Martins, [place] 11/08/23 1711

## 2023-11-08 NOTE — Discharge Instructions (Addendum)
The area on your eye is called Blepharitis and This is an infection in the eyelid. We will treat with the following:  Erythromycin ointment twice daily to the area for 7 days. May use warm compresses on the eye Return to urgent care or PCP if symptoms worsen or fail to resolve.   For the herpes outbreak we will treat with the following:  Valacyclovir 500mg  twice daily for 5 days

## 2023-11-08 NOTE — ED Triage Notes (Addendum)
Pt presents with left eye swelling x 2 weeks, "worse today." Pt currently denies pain/recent injury to area. Pt also reports burning with urination and pain during intercourse. Pt states "I know it is a herpes outbreak, I just need the medicine." Pt denies taking medications for pain/symptoms. Pt also states "white pus coming from upper eyelid this morning."

## 2023-11-18 ENCOUNTER — Ambulatory Visit
Admission: EM | Admit: 2023-11-18 | Discharge: 2023-11-18 | Disposition: A | Discharge status: Home / Self Care | Payer: Self-pay | Attending: Family Medicine | Admitting: Family Medicine

## 2023-11-18 ENCOUNTER — Encounter: Payer: Self-pay | Admitting: Emergency Medicine

## 2023-11-18 ENCOUNTER — Other Ambulatory Visit: Payer: Self-pay

## 2023-11-18 DIAGNOSIS — A6000 Herpesviral infection of urogenital system, unspecified: Secondary | ICD-10-CM

## 2023-11-18 DIAGNOSIS — N898 Other specified noninflammatory disorders of vagina: Secondary | ICD-10-CM

## 2023-11-18 MED ORDER — ACYCLOVIR 400 MG PO TABS: ORAL_TABLET | ORAL | 0 refills | Status: DC

## 2023-11-18 MED ORDER — VALACYCLOVIR HCL 1 G PO TABS: ORAL_TABLET | ORAL | 0 refills | Status: DC

## 2023-11-18 MED ORDER — FLUCONAZOLE 150 MG PO TABS: ORAL_TABLET | ORAL | 0 refills | Status: DC

## 2023-11-18 NOTE — ED Provider Notes (Signed)
[facility]   [id number] 11/18/23 Arrival Time: 0907  ASSESSMENT & PLAN:  1. Herpes simplex infection of genitourinary system   2. Vaginal discharge    Declines vaginal cytology and GU exam.  Meds ordered this encounter  Medications   fluconazole (DIFLUCAN) 150 MG tablet    Sig: Take one tablet by mouth as a single dose. May repeat in 3 days if symptoms persist.    Dispense:  2 tablet    Refill:  0       acyclovir (ZOVIRAX) 400 MG tablet    Sig: Take 2 tablets orally twice daily for 5 days for recurrent herpes outbreaks.    Dispense:  50 tablet    Refill:  0   Would like Rx acyclovir instead of valacyclovir.   Follow-up Information     Jeani Sow, MD.   Specialty: Family Medicine Why: As needed. Contact information: [address] [place] [address] [phone]                  Reviewed expectations re: course of current medical issues. Questions answered. Outlined signs and symptoms indicating need for more acute intervention. Patient verbalized understanding. After Visit Summary given.   SUBJECTIVE:  Stephanie Reid is a 32 y.o. female who presents with complaint of vaginal irritation and HSV outbreak; x 4-5 days; Valtrex without any relief from UC sev d ago. Feels she also has a yeast infection; with vagincal d/c and itching. Denies: urinary frequency, dysuria, and gross hematuria. Afebrile. No abdominal or pelvic pain. Normal PO intake wihout n/v. No genital rashes or lesions.  OBJECTIVE:  Vitals:   11/18/23 1007  BP: 107/77  Pulse: 70  Resp: 18  Temp: 97.9 F (36.6 C)  TempSrc: Oral  SpO2: 96%     General appearance: alert, cooperative, appears stated age and no distress Lungs: unlabored respirations; speaks full sentences without difficulty Back: no CVA tenderness; FROM at waist Abdomen: soft, non-tender GU: pt declined Skin: warm and dry Psychological: alert and cooperative; normal mood and  affect.    Labs Reviewed - No data to display  Allergies  Allergen Reactions   Apple Juice Anaphylaxis   Banana Anaphylaxis   Orange Fruit [Citrus] Anaphylaxis   Bacid Other (See Comments)    hemmorrhoids   Spinach Diarrhea   Vicodin [Hydrocodone-Acetaminophen] Nausea And Vomiting   Hydrocodone-Acetaminophen     Other Reaction(s): GI Intolerance   Oxycodone-Acetaminophen     Other Reaction(s): GI Intolerance   Penicillin G     GI Upset (intolerance)  Did it involve swelling of the face/tongue/throat, SOB, or low BP? No  Did it involve sudden or severe rash/hives, skin peeling, or any reaction on the inside of your mouth or nose? No  Did you need to seek medical attention at a hospital or doctor's office? No  When did it last happen?  not recent  If all above answers are "NO", may proceed with cephalosporin use.  Other Reaction(s): GI Intolerance    Past Medical History:  Diagnosis Date   Bacterial vaginosis    Diabetes mellitus without complication (HCC)    Herpes    Herpes simplex    Hx of chlamydia infection    Hx of gonorrhea    Hx of trichomoniasis    Seasonal allergies    Family History  Problem Relation Age of Onset   Diabetes Mother    Hypertension Mother    Heart disease Mother  pacemaker   Stroke Mother 15   Heart attack Mother 71   Diabetes Father    Hypertension Maternal Grandmother    Diabetes Maternal Grandmother    Social History   Socioeconomic History   Marital status: Single    Spouse name: Not on file   Number of children: 0   Years of education: Not on file   Highest education level: Not on file  Occupational History   Occupation: Event organiser: [employer]  Tobacco Use   Smoking status: Every Day    Current packs/day: 0.10    Average packs/day: 0.1 packs/day for 15.0 years (1.5 ttl pk-yrs)    Types: Cigarettes   Smokeless tobacco: Never   Tobacco comments:    2 cigs per day   Vaping Use   Vaping status: Never  Used  Substance and Sexual Activity   Alcohol use: Yes    Alcohol/week: 4.0 standard drinks of alcohol    Types: 4 Glasses of wine per week    Comment: wine qod and liquor on wekend   Drug use: Yes    Types: Marijuana    Comment: rarely.   Sexual activity: Yes    Birth control/protection: None    Comment: Last encounter: [id number]  Other Topics Concern   Not on file  Social History Narrative   Not on file   Social Drivers of Health   Financial Resource Strain: Not on file  Food Insecurity: No Food Insecurity (04/29/2019)   Hunger Vital Sign    Worried About Running Out of Food in the Last Year: Never true    Ran Out of Food in the Last Year: Never true  Transportation Needs: No Transportation Needs (04/29/2019)   PRAPARE - Administrator, Civil Service (Medical): No    Lack of Transportation (Non-Medical): No  Physical Activity: Not on file  Stress: Not on file  Social Connections: Not on file  Intimate Partner Violence: Not on file           Mardella Layman, MD 11/18/23 1032

## 2023-11-18 NOTE — ED Triage Notes (Signed)
Pt here for vaginal pain from herpes outbreak and sts she needs more meds; pt also feels like she has a yeast infection

## 2023-12-15 ENCOUNTER — Other Ambulatory Visit: Payer: Self-pay | Admitting: Family Medicine

## 2023-12-16 ENCOUNTER — Other Ambulatory Visit: Payer: Self-pay | Admitting: Family Medicine

## 2023-12-16 NOTE — Telephone Encounter (Signed)
Left detailed message informing patient to schedule appt. ASAP for further refills.

## 2023-12-18 ENCOUNTER — Encounter (HOSPITAL_COMMUNITY): Payer: Self-pay

## 2023-12-18 ENCOUNTER — Emergency Department (HOSPITAL_COMMUNITY)
Admission: EM | Admit: 2023-12-18 | Discharge: 2023-12-18 | Disposition: A | Discharge status: Home / Self Care | Payer: Self-pay | Attending: Emergency Medicine | Admitting: Emergency Medicine

## 2023-12-18 ENCOUNTER — Other Ambulatory Visit: Payer: Self-pay

## 2023-12-18 DIAGNOSIS — R0981 Nasal congestion: Secondary | ICD-10-CM | POA: Insufficient documentation

## 2023-12-18 DIAGNOSIS — E1165 Type 2 diabetes mellitus with hyperglycemia: Secondary | ICD-10-CM | POA: Insufficient documentation

## 2023-12-18 DIAGNOSIS — R059 Cough, unspecified: Secondary | ICD-10-CM | POA: Insufficient documentation

## 2023-12-18 DIAGNOSIS — Z20822 Contact with and (suspected) exposure to covid-19: Secondary | ICD-10-CM | POA: Insufficient documentation

## 2023-12-18 DIAGNOSIS — J069 Acute upper respiratory infection, unspecified: Secondary | ICD-10-CM

## 2023-12-18 DIAGNOSIS — Z794 Long term (current) use of insulin: Secondary | ICD-10-CM | POA: Insufficient documentation

## 2023-12-18 DIAGNOSIS — A6000 Herpesviral infection of urogenital system, unspecified: Secondary | ICD-10-CM | POA: Insufficient documentation

## 2023-12-18 DIAGNOSIS — R739 Hyperglycemia, unspecified: Secondary | ICD-10-CM

## 2023-12-18 DIAGNOSIS — Z7984 Long term (current) use of oral hypoglycemic drugs: Secondary | ICD-10-CM | POA: Insufficient documentation

## 2023-12-18 DIAGNOSIS — A6009 Herpesviral infection of other urogenital tract: Secondary | ICD-10-CM

## 2023-12-18 LAB — WET PREP, GENITAL
Clue Cells Wet Prep HPF POC: NONE SEEN
Sperm: NONE SEEN
Trich, Wet Prep: NONE SEEN
WBC, Wet Prep HPF POC: <10 (ref <10)
Yeast Wet Prep HPF POC: NONE SEEN

## 2023-12-18 LAB — URINALYSIS, ROUTINE W REFLEX MICROSCOPIC
Bacteria, UA: NONE SEEN
Bilirubin Urine: NEGATIVE
Glucose, UA: >=500 mg/dL — AB
Hgb urine dipstick: NEGATIVE
Ketones, ur: NEGATIVE mg/dL
Leukocytes,Ua: NEGATIVE
Nitrite: NEGATIVE
Protein, ur: NEGATIVE mg/dL
Specific Gravity, Urine: 1.033 — ABNORMAL HIGH (ref 1.005–1.030)
pH: 6 (ref 5.0–8.0)

## 2023-12-18 LAB — RESP PANEL BY RT-PCR (RSV, FLU A&B, COVID)  RVPGX2
Influenza A by PCR: NEGATIVE
Influenza B by PCR: NEGATIVE
Resp Syncytial Virus by PCR: NEGATIVE
SARS Coronavirus 2 by RT PCR: NEGATIVE

## 2023-12-18 LAB — CBG MONITORING, ED: Glucose-Capillary: 289 mg/dL — ABNORMAL HIGH (ref 70–99)

## 2023-12-18 MED ORDER — FLUCONAZOLE 150 MG PO TABS
150.0000 mg | ORAL_TABLET | Freq: Once | ORAL | Status: AC
  Administered 2023-12-18: 150 mg via ORAL
  Filled 2023-12-18: qty 1

## 2023-12-18 MED ORDER — VALACYCLOVIR HCL 1 G PO TABS: 1000.0000 mg | ORAL_TABLET | Freq: Three times a day (TID) | ORAL | 0 refills | Status: DC

## 2023-12-18 NOTE — Discharge Instructions (Signed)
As we discussed, your glucose was 289.  Please check your glucose frequently and use your insulin as prescribed by your doctor  You may have mild herpes infection and I prescribed Valtrex 1000 mg 3 times daily for a week  You were given Diflucan for possible yeast infection  See your doctor for follow-up  We sent off gonorrhea chlamydia test and if it is positive you need to notify your partner  Return to ER if you have fevers or worse vaginal discharge or abdominal pain or vomiting or trouble breathing

## 2023-12-18 NOTE — ED Provider Notes (Addendum)
[place] EMERGENCY DEPARTMENT AT [facility] Provider Note   CSN: [id number] Arrival date & time: 12/18/23  2136     History  Chief Complaint  Patient presents with   Cough    Stephanie Reid is a 33 y.o. female here presenting with cough and congestion and vaginal discharge.  Patient states that she just came back from [place].  She states that she has been having cough and congestion for several days.  She is concerned that she may have the flu.  Patient states that she is diabetic and she gets recurrent yeast infections.  She states that she has some cottage cheese appearing discharge first 2 weeks now.  She tried Monistat with no relief.  Patient states that she is not concerned for STDs and she is sexually active with her boyfriend only  The history is provided by the patient.       Home Medications Prior to Admission medications   Medication Sig Start Date End Date Taking? Authorizing Provider  atorvastatin (LIPITOR) 20 MG tablet Take 1 tablet (20 mg total) by mouth daily. 12/20/22   Jeani Sow, MD  acyclovir (ZOVIRAX) 400 MG tablet Take 2 tablets orally twice daily for 5 days for recurrent herpes outbreaks. 11/18/23   Mardella Layman, MD  blood glucose meter kit and supplies Dispense based on patient and insurance preference. Use up to four times daily as directed. (FOR ICD-10 E10.9, E11.9). 07/22/22   Hongalgi, Maximino Greenland, MD  Blood Glucose Monitoring Suppl DEVI 1 each by Does not apply route in the morning, at noon, and at bedtime. May substitute to any manufacturer covered by patient's insurance. 06/28/23   White, Elita Boone, NP  Continuous Blood Gluc Sensor (DEXCOM G7 SENSOR) MISC 1 each by Does not apply route every 14 (fourteen) days. Every 10 days. Not 14 12/19/22   Jeani Sow, MD  fluconazole (DIFLUCAN) 150 MG tablet Take one tablet by mouth as a single dose. May repeat in 3 days if symptoms persist. 11/18/23   Mardella Layman, MD  Insulin Pen Needle  (PEN NEEDLES 3/16") 31G X 5 MM MISC Use twice daily as per instructions. 07/22/22   Elease Etienne, MD  metFORMIN (GLUCOPHAGE) 500 MG tablet Take 1 tablet (500 mg total) by mouth 2 (two) times daily with a meal. 12/17/22   Jeani Sow, MD  NOVOLIN 70/30 KWIKPEN (70-30) 100 UNIT/ML KwikPen INJECT 20 UNITS SUBCUTANEOUSLY TWICE DAILY BEFORE A MEAL 12/16/23   Jeani Sow, MD  Semaglutide,0.25 or 0.5MG /DOS, 2 MG/3ML SOPN Inject 0.25 mg into the skin once a week. 12/17/22   Jeani Sow, MD  traMADol (ULTRAM) 50 MG tablet Take 1 tablet (50 mg total) by mouth every 6 (six) hours as needed. 03/26/23   White, Elita Boone, NP  valACYclovir (VALTREX) 1000 MG tablet Take one tablet daily for 5 days for recurrent HSV outbreaks. 11/18/23   Mardella Layman, MD  promethazine (PHENERGAN) 12.5 MG tablet Take 1 tablet (12.5 mg total) by mouth every 6 (six) hours as needed for nausea or vomiting. 12/09/20 02/06/21  Milagros Loll, MD      Allergies    Apple juice, Banana, Orange fruit [citrus], Bacid, Spinach, Vicodin [hydrocodone-acetaminophen], Hydrocodone-acetaminophen, Oxycodone-acetaminophen, and Penicillin g    Review of Systems   Review of Systems  Respiratory:  Positive for cough.   Genitourinary:  Positive for vaginal discharge.  All other systems reviewed and are negative.   Physical Exam Updated Vital Signs  BP (!) 124/93   Pulse 81   Temp 98 F (36.7 C)   Resp 18   Ht 5\' 5"  (1.651 m)   Wt 77 kg   SpO2 98%   BMI 28.25 kg/m  Physical Exam Vitals and nursing note reviewed.  HENT:     Head: Normocephalic.     Nose: Nose normal.     Mouth/Throat:     Mouth: Mucous membranes are moist.  Eyes:     Extraocular Movements: Extraocular movements intact.     Pupils: Pupils are equal, round, and reactive to light.  Cardiovascular:     Rate and Rhythm: Normal rate and regular rhythm.     Pulses: Normal pulses.     Heart sounds: Normal heart sounds.  Pulmonary:     Effort: Pulmonary  effort is normal.     Breath sounds: Normal breath sounds.  Abdominal:     General: Abdomen is flat.     Palpations: Abdomen is soft.  Genitourinary:    Comments: Patient wants to defer pelvic exam Musculoskeletal:     Cervical back: Normal range of motion and neck supple.  Skin:    General: Skin is warm.  Neurological:     General: No focal deficit present.     Mental Status: She is alert and oriented to person, place, and time.  Psychiatric:        Mood and Affect: Mood normal.        Behavior: Behavior normal.     ED Results / Procedures / Treatments   Labs (all labs ordered are listed, but only abnormal results are displayed) Labs Reviewed  URINALYSIS, ROUTINE W REFLEX MICROSCOPIC - Abnormal; Notable for the following components:      Result Value   Color, Urine STRAW (*)    Specific Gravity, Urine 1.033 (*)    Glucose, UA >=500 (*)    All other components within normal limits  RESP PANEL BY RT-PCR (RSV, FLU A&B, COVID)  [id number]  WET PREP, GENITAL  GC/CHLAMYDIA PROBE AMP ([place]) NOT AT [facility]    EKG None  Radiology No results found.  Procedures Procedures    Medications Ordered in ED Medications - No data to display  ED Course/ Medical Decision Making/ A&P                                 Medical Decision Making [name] is a 33 y.o. female here presenting with flulike symptom and vaginal discharge.  Patient traveled recently and now has flulike symptoms.  Plan to get RSV and flu and COVID test.  Patient also has history of diabetes and recurrent yeast infection.  Will get wet prep and GC chlamydia.  Patient declined empiric treatment for STDs and only has 1 partner  11:10 PM I reviewed patient's labs and glucose was 289.  Wet prep is negative.  UA is negative.  Patient states that she is taking Monistat already and requests a dose of Diflucan.  She also told me that she has a history of herpes and she is concerned that she has some painful  lesions.  I was able to perform a external vaginal exam and she does have some herpetic lesions. Will give course of valtrex and again offered STD treatment and she declined.  Of note her COVID and flu and RSV test are negative.     Problems Addressed: Herpes genitalis in women: acute  illness or injury Hyperglycemia: acute illness or injury  Amount and/or Complexity of Data Reviewed Labs: ordered. Decision-making details documented in ED Course.  Risk Prescription drug management.    Final Clinical Impression(s) / ED Diagnoses Final diagnoses:  None    Rx / DC Orders ED Discharge Orders     None         Charlynne Pander, MD 12/18/23 2311    Charlynne Pander, MD 12/18/23 [phone]

## 2023-12-18 NOTE — ED Triage Notes (Signed)
C/o right sided dental pain, body aches, cough, vaginal discharge described as cottage chese, and vaginal itching x2 weeks.

## 2023-12-19 LAB — GC/CHLAMYDIA PROBE AMP (~~LOC~~) NOT AT ARMC
Chlamydia: NEGATIVE
Comment: NEGATIVE
Comment: NORMAL
Neisseria Gonorrhea: NEGATIVE

## 2024-02-19 ENCOUNTER — Ambulatory Visit
Admission: EM | Admit: 2024-02-19 | Discharge: 2024-02-19 | Disposition: A | Discharge status: Home / Self Care | Payer: Self-pay | Attending: Internal Medicine | Admitting: Internal Medicine

## 2024-02-19 DIAGNOSIS — Z113 Encounter for screening for infections with a predominantly sexual mode of transmission: Secondary | ICD-10-CM | POA: Insufficient documentation

## 2024-02-19 DIAGNOSIS — N898 Other specified noninflammatory disorders of vagina: Secondary | ICD-10-CM | POA: Insufficient documentation

## 2024-02-19 DIAGNOSIS — Z8619 Personal history of other infectious and parasitic diseases: Secondary | ICD-10-CM | POA: Insufficient documentation

## 2024-02-19 LAB — POCT FASTING CBG KUC MANUAL ENTRY: POCT Glucose (KUC): 354 mg/dL — AB (ref 70–99)

## 2024-02-19 LAB — POCT URINALYSIS DIP (MANUAL ENTRY)
Bilirubin, UA: NEGATIVE
Blood, UA: NEGATIVE
Glucose, UA: >=1000 mg/dL — AB
Ketones, POC UA: NEGATIVE mg/dL
Leukocytes, UA: NEGATIVE
Nitrite, UA: NEGATIVE
Protein Ur, POC: NEGATIVE mg/dL
Spec Grav, UA: 1.015
Urobilinogen, UA: 0.2 U/dL
pH, UA: 7

## 2024-02-19 LAB — POCT URINE PREGNANCY: Preg Test, Ur: NEGATIVE

## 2024-02-19 MED ORDER — ACYCLOVIR 400 MG PO TABS: 800.0000 mg | ORAL_TABLET | Freq: Two times a day (BID) | ORAL | 0 refills | Status: AC

## 2024-02-19 MED ORDER — FLUCONAZOLE 150 MG PO TABS: 150.0000 mg | ORAL_TABLET | ORAL | 0 refills | Status: DC

## 2024-02-19 NOTE — ED Triage Notes (Signed)
 Pt presents with vaginal discomfort. Pt believes she has a yeast infection. Pt also says having intercourse is painful. She states, " I took all my medicine and I don't have an outbreak so I'm not sure why it is hurting to have sex. I need a refill on my medicine."

## 2024-02-19 NOTE — ED Provider Notes (Signed)
 [facility]    CSN: [id number] Arrival date & time: 02/19/24  4098      History   Chief Complaint Chief Complaint  Patient presents with   Vaginitis   Vaginal Pain    HPI Stephanie Reid is a 33 y.o. female.   Patient presents with vaginal itching and discomfort that has been present for a few days.  Denies any vaginal discharge.  Reports that she is concerned for a yeast infection given itching as she has a history of recurrent yeast infections due to hyperglycemia.  She has not been monitoring her blood sugar at home as she has run out of glucose strips.  Denies exposure to STD but patient has had unprotected sexual intercourse.  Last menstrual cycle was in 2012, and this is baseline for patient after receiving Depo birth control injections.  Reports that the symptoms are causing painful intercourse as well.  She does have a history of genital herpes and has noticed a few lesions on the outer portion of her vaginal area.  She took Valtrex for approximately 1 week with minimal improvement.  Reports that she has previously seen better results with acyclovir for outbreaks.  Patient does not have an OB/GYN.   Vaginal Pain    Past Medical History:  Diagnosis Date   Bacterial vaginosis    Diabetes mellitus without complication (HCC)    Herpes    Herpes simplex    Hx of chlamydia infection    Hx of gonorrhea    Hx of trichomoniasis    Seasonal allergies     Patient Active Problem List   Diagnosis Date Noted   New onset type 2 diabetes mellitus (HCC) 07/21/2022   Dehydration 07/21/2022   Vaginal candidiasis 07/21/2022   Amenorrhea, secondary 10/06/2013    Past Surgical History:  Procedure Laterality Date   NO PAST SURGERIES      OB History     Gravida  0   Para  0   Term  0   Preterm  0   AB  0   Living  0      SAB  0   IAB  0   Ectopic  0   Multiple  0   Live Births  0            Home Medications    Prior to Admission  medications   Medication Sig Start Date End Date Taking? Authorizing Provider  acyclovir (ZOVIRAX) 400 MG tablet Take 2 tablets (800 mg total) by mouth 2 (two) times daily for 5 days. 02/19/24 02/24/24 Yes Ladell Lea, Acie Fredrickson, FNP  atorvastatin (LIPITOR) 20 MG tablet Take 1 tablet (20 mg total) by mouth daily. 12/20/22   Jeani Sow, MD  fluconazole (DIFLUCAN) 150 MG tablet Take 1 tablet (150 mg total) by mouth every 3 (three) days. 02/19/24  Yes Zayley Arras, Acie Fredrickson, FNP  valACYclovir (VALTREX) 1000 MG tablet Take 1 tablet (1,000 mg total) by mouth 3 (three) times daily. 12/18/23  Yes Charlynne Pander, MD  blood glucose meter kit and supplies Dispense based on patient and insurance preference. Use up to four times daily as directed. (FOR ICD-10 E10.9, E11.9). 07/22/22   Hongalgi, Maximino Greenland, MD  Blood Glucose Monitoring Suppl DEVI 1 each by Does not apply route in the morning, at noon, and at bedtime. May substitute to any manufacturer covered by patient's insurance. 06/28/23   Valinda Hoar, NP  Continuous Blood Gluc Sensor (DEXCOM G7 SENSOR) MISC  1 each by Does not apply route every 14 (fourteen) days. Every 10 days. Not 14 12/19/22   Jeani Sow, MD  Insulin Pen Needle (PEN NEEDLES 3/16") 31G X 5 MM MISC Use twice daily as per instructions. 07/22/22   Elease Etienne, MD  metFORMIN (GLUCOPHAGE) 500 MG tablet Take 1 tablet (500 mg total) by mouth 2 (two) times daily with a meal. 12/17/22   Jeani Sow, MD  NOVOLIN 70/30 KWIKPEN (70-30) 100 UNIT/ML KwikPen INJECT 20 UNITS SUBCUTANEOUSLY TWICE DAILY BEFORE A MEAL 12/16/23   Jeani Sow, MD  Semaglutide,0.25 or 0.5MG /DOS, 2 MG/3ML SOPN Inject 0.25 mg into the skin once a week. 12/17/22   Jeani Sow, MD  traMADol (ULTRAM) 50 MG tablet Take 1 tablet (50 mg total) by mouth every 6 (six) hours as needed. 03/26/23   White, Elita Boone, NP  promethazine (PHENERGAN) 12.5 MG tablet Take 1 tablet (12.5 mg total) by mouth every 6 (six) hours as needed for  nausea or vomiting. 12/09/20 02/06/21  Milagros Loll, MD    Family History Family History  Problem Relation Age of Onset   Diabetes Mother    Hypertension Mother    Heart disease Mother        pacemaker   Stroke Mother 62   Heart attack Mother 60   Diabetes Father    Hypertension Maternal Grandmother    Diabetes Maternal Grandmother     Social History Social History   Tobacco Use   Smoking status: Every Day    Current packs/day: 0.10    Average packs/day: 0.1 packs/day for 15.0 years (1.5 ttl pk-yrs)    Types: Cigarettes   Smokeless tobacco: Never   Tobacco comments:    2 cigs per day   Vaping Use   Vaping status: Never Used  Substance Use Topics   Alcohol use: Yes    Alcohol/week: 4.0 standard drinks of alcohol    Types: 4 Glasses of wine per week    Comment: wine qod and liquor on wekend   Drug use: Yes    Types: Marijuana    Comment: rarely.     Allergies   Apple juice, Banana, Orange fruit [citrus], Bacid, Spinach, Vicodin [hydrocodone-acetaminophen], Hydrocodone-acetaminophen, Oxycodone-acetaminophen, and Penicillin g   Review of Systems Review of Systems Per HPI  Physical Exam Triage Vital Signs ED Triage Vitals  Encounter Vitals Group     BP 02/19/24 0926 110/74     Systolic BP Percentile --      Diastolic BP Percentile --      Pulse Rate 02/19/24 0926 87     Resp 02/19/24 0926 16     Temp 02/19/24 0926 97.8 F (36.6 C)     Temp Source 02/19/24 0926 Oral     SpO2 02/19/24 0926 98 %     Weight --      Height --      Head Circumference --      Peak Flow --      Pain Score 02/19/24 0923 8     Pain Loc --      Pain Education --      Exclude from Growth Chart --    No data found.  Updated Vital Signs BP 110/74 (BP Location: Right Arm)   Pulse 87   Temp 97.8 F (36.6 C) (Oral)   Resp 16   LMP  (LMP Unknown)   SpO2 98%   Visual Acuity Right Eye Distance:   Left Eye  Distance:   Bilateral Distance:    Right Eye Near:   Left Eye  Near:    Bilateral Near:     Physical Exam Constitutional:      General: She is not in acute distress.    Appearance: Normal appearance. She is not toxic-appearing or diaphoretic.  HENT:     Head: Normocephalic and atraumatic.  Eyes:     Extraocular Movements: Extraocular movements intact.     Conjunctiva/sclera: Conjunctivae normal.  Pulmonary:     Effort: Pulmonary effort is normal.  Genitourinary:    Comments: Declined with shared decision making. Self swab performed.  Neurological:     General: No focal deficit present.     Mental Status: She is alert and oriented to person, place, and time. Mental status is at baseline.  Psychiatric:        Mood and Affect: Mood normal.        Behavior: Behavior normal.        Thought Content: Thought content normal.        Judgment: Judgment normal.      UC Treatments / Results  Labs (all labs ordered are listed, but only abnormal results are displayed) Labs Reviewed  POCT URINALYSIS DIP (MANUAL ENTRY) - Abnormal; Notable for the following components:      Result Value   Glucose, UA >=1,000 (*)    All other components within normal limits  POCT FASTING CBG KUC MANUAL ENTRY - Abnormal; Notable for the following components:   POCT Glucose ([facility]) 354 (*)    All other components within normal limits  POCT URINE PREGNANCY - Normal  CERVICOVAGINAL ANCILLARY ONLY    EKG   Radiology No results found.  Procedures Procedures (including critical care time)  Medications Ordered in UC Medications - No data to display  Initial Impression / Assessment and Plan / UC Course  I have reviewed the triage vital signs and the nursing notes.  Pertinent labs & imaging results that were available during my care of the patient were reviewed by me and considered in my medical decision making (see chart for details).     Urine pregnancy test negative.  UA showing glucosuria.  Blood glucose today was in the 300s.  Advised patient of the  importance of monitoring her blood sugar at home, and she states that she will pick up glucose strips to monitor.  She does have insulin at home that she is taking.  Suspect that patient does have a yeast infection given history of recurrence and hyperglycemia.  Therefore, will opt to treat with Diflucan today while awaiting cervicovaginal swab.  Patient also concerned about genital herpes outbreak so will treat with acyclovir given valacyclovir was not helpful approximately 1 to 2 weeks ago.  Also advised follow-up with OB/GYN for recurrent infections and for painful intercourse especially if that does not resolve with current treatment.  Patient provided with phone number for OB/GYN.  Patient verbalized understanding and was agreeable with plan. Final Clinical Impressions(s) / UC Diagnoses   Final diagnoses:  Vaginal itching  Screening examination for venereal disease  History of herpes genitalis     Discharge Instructions      Vaginal swab is pending.  I will call you if results are positive.  I have prescribed Diflucan and acyclovir.  Please follow-up if any symptoms persist or worsen.  Follow-up with OB/GYN as soon as possible as well.    ED Prescriptions     Medication Sig Dispense Auth. Provider  fluconazole (DIFLUCAN) 150 MG tablet Take 1 tablet (150 mg total) by mouth every 3 (three) days. 2 tablet Sarah Ann, [place], [place]   acyclovir (ZOVIRAX) 400 MG tablet Take 2 tablets (800 mg total) by mouth 2 (two) times daily for 5 days. 20 tablet [place], Acie Fredrickson, [place]      PDMP not reviewed this encounter.   Gustavus Bryant, [place] 02/19/24 [phone]

## 2024-02-19 NOTE — Discharge Instructions (Signed)
 Vaginal swab is pending.  I will call you if results are positive.  I have prescribed Diflucan and acyclovir.  Please follow-up if any symptoms persist or worsen.  Follow-up with OB/GYN as soon as possible as well.

## 2024-02-20 LAB — CERVICOVAGINAL ANCILLARY ONLY
Bacterial Vaginitis (gardnerella): NEGATIVE
Candida Glabrata: NEGATIVE
Candida Vaginitis: POSITIVE — AB
Chlamydia: NEGATIVE
Comment: NEGATIVE
Comment: NEGATIVE
Comment: NEGATIVE
Comment: NEGATIVE
Comment: NEGATIVE
Comment: NORMAL
Neisseria Gonorrhea: NEGATIVE
Trichomonas: NEGATIVE

## 2024-03-02 ENCOUNTER — Telehealth: Payer: Self-pay | Admitting: Physician Assistant

## 2024-03-02 DIAGNOSIS — R102 Pelvic and perineal pain: Secondary | ICD-10-CM

## 2024-03-02 DIAGNOSIS — N9489 Other specified conditions associated with female genital organs and menstrual cycle: Secondary | ICD-10-CM

## 2024-03-02 NOTE — Progress Notes (Signed)
  Because of atypical and prolonged symptoms, I feel your condition warrants further evaluation and I recommend that you be seen in a face-to-face visit.   NOTE: There will be NO CHARGE for this E-Visit   If you are having a true medical emergency, please call 911.     For an urgent face to face visit, [facility] has multiple urgent care centers for your convenience.  Click the link below for the full list of locations and hours, walk-in wait times, appointment scheduling options and driving directions:  Urgent Care - [place], [place], [place], [place], [place], [place]  [place]     Your MyChart E-visit questionnaire answers were reviewed by a board certified advanced clinical practitioner to complete your personal care plan based on your specific symptoms.    Thank you for using e-Visits.

## 2024-05-11 ENCOUNTER — Ambulatory Visit
Admission: RE | Admit: 2024-05-11 | Discharge: 2024-05-11 | Disposition: A | Discharge status: Home / Self Care | Payer: Self-pay | Source: Ambulatory Visit | Attending: Physician Assistant | Admitting: Physician Assistant

## 2024-05-11 VITALS — BP 123/82 | HR 64 | Temp 98.1°F | Resp 18 | Wt 169.8 lb

## 2024-05-11 DIAGNOSIS — B3731 Acute candidiasis of vulva and vagina: Secondary | ICD-10-CM

## 2024-05-11 MED ORDER — FLUCONAZOLE 150 MG PO TABS: 150.0000 mg | ORAL_TABLET | ORAL | 2 refills | Status: DC

## 2024-05-11 NOTE — ED Provider Notes (Signed)
 [facility]    CSN: [id number] Arrival date & time: 05/11/24  0850      History   Chief Complaint Chief Complaint  Patient presents with   Vaginitis    Yeast infection - Entered by patient    HPI Stephanie Reid is a 33 y.o. female.   Patient complains of having a yeast infection.  Patient reports that she is diabetic and gets frequent yeast infections.  Patient does not feel like she needs testing for STDs.  She reports no current risk.  Patient reports her diabetes has not been well-controlled.  The history is provided by the patient. No language interpreter was used.    Past Medical History:  Diagnosis Date   Bacterial vaginosis    Diabetes mellitus without complication (HCC)    Herpes    Herpes simplex    Hx of chlamydia infection    Hx of gonorrhea    Hx of trichomoniasis    Seasonal allergies     Patient Active Problem List   Diagnosis Date Noted   New onset type 2 diabetes mellitus (HCC) 07/21/2022   Dehydration 07/21/2022   Vaginal candidiasis 07/21/2022   Amenorrhea, secondary 10/06/2013    Past Surgical History:  Procedure Laterality Date   NO PAST SURGERIES      OB History     Gravida  0   Para  0   Term  0   Preterm  0   AB  0   Living  0      SAB  0   IAB  0   Ectopic  0   Multiple  0   Live Births  0            Home Medications    Prior to Admission medications   Medication Sig Start Date End Date Taking? Authorizing Provider  atorvastatin  (LIPITOR) 20 MG tablet Take 1 tablet (20 mg total) by mouth daily. 12/20/22   Christel Cousins, MD  blood glucose meter kit and supplies Dispense based on patient and insurance preference. Use up to four times daily as directed. (FOR ICD-10 E10.9, E11.9). 07/22/22   Hongalgi, Anand D, MD  Blood Glucose Monitoring Suppl DEVI 1 each by Does not apply route in the morning, at noon, and at bedtime. May substitute to any manufacturer covered by patient's insurance. 06/28/23    White, Maybelle Spatz, NP  Continuous Blood Gluc Sensor (DEXCOM G7 SENSOR) MISC 1 each by Does not apply route every 14 (fourteen) days. Every 10 days. Not 14 12/19/22   Christel Cousins, MD  fluconazole  (DIFLUCAN ) 150 MG tablet Take 1 tablet (150 mg total) by mouth every 3 (three) days. 05/11/24   Jeri Jeanbaptiste K, PA-C  Insulin  Pen Needle (PEN NEEDLES 3/16) 31G X 5 MM MISC Use twice daily as per instructions. 07/22/22   Hongalgi, Anand D, MD  metFORMIN  (GLUCOPHAGE ) 500 MG tablet Take 1 tablet (500 mg total) by mouth 2 (two) times daily with a meal. 12/17/22   Christel Cousins, MD  NOVOLIN  70/30 KWIKPEN (70-30) 100 UNIT/ML KwikPen INJECT 20 UNITS SUBCUTANEOUSLY TWICE DAILY BEFORE A MEAL 12/16/23   Christel Cousins, MD  Semaglutide ,0.25 or 0.5MG /DOS, 2 MG/3ML SOPN Inject 0.25 mg into the skin once a week. 12/17/22   Christel Cousins, MD  traMADol  (ULTRAM ) 50 MG tablet Take 1 tablet (50 mg total) by mouth every 6 (six) hours as needed. 03/26/23   Reena Canning, NP  valACYclovir  (VALTREX )  1000 MG tablet Take 1 tablet (1,000 mg total) by mouth 3 (three) times daily. 12/18/23   Dalene Duck, MD  promethazine  (PHENERGAN ) 12.5 MG tablet Take 1 tablet (12.5 mg total) by mouth every 6 (six) hours as needed for nausea or vomiting. 12/09/20 02/06/21  Camellia Caves, MD    Family History Family History  Problem Relation Age of Onset   Diabetes Mother    Hypertension Mother    Heart disease Mother        pacemaker   Stroke Mother 66   Heart attack Mother 58   Diabetes Father    Hypertension Maternal Grandmother    Diabetes Maternal Grandmother     Social History Social History   Tobacco Use   Smoking status: Every Day    Current packs/day: 0.10    Average packs/day: 0.1 packs/day for 15.0 years (1.5 ttl pk-yrs)    Types: Cigarettes    Passive exposure: Current   Smokeless tobacco: Never   Tobacco comments:    2 cigs per day   Vaping Use   Vaping status: Never Used  Substance Use Topics    Alcohol use: Yes    Alcohol/week: 4.0 standard drinks of alcohol    Types: 4 Glasses of wine per week    Comment: wine qod and liquor on wekend   Drug use: Yes    Types: Marijuana    Comment: rarely.     Allergies   Apple juice, Banana, Orange fruit [citrus], Bacid, Spinach, Vicodin [hydrocodone -acetaminophen ], Hydrocodone -acetaminophen , Oxycodone -acetaminophen , and Penicillin  g   Review of Systems Review of Systems  Genitourinary:  Positive for vaginal discharge.  All other systems reviewed and are negative. Follow-up for what   Physical Exam Triage Vital Signs ED Triage Vitals  Encounter Vitals Group     BP 05/11/24 0913 123/82     Girls Systolic BP Percentile --      Girls Diastolic BP Percentile --      Boys Systolic BP Percentile --      Boys Diastolic BP Percentile --      Pulse Rate 05/11/24 0913 64     Resp 05/11/24 0913 18     Temp 05/11/24 0913 98.1 F (36.7 C)     Temp Source 05/11/24 0913 Oral     SpO2 05/11/24 0913 97 %     Weight 05/11/24 0912 169 lb 12.1 oz (77 kg)     Height --      Head Circumference --      Peak Flow --      Pain Score 05/11/24 0911 8     Pain Loc --      Pain Education --      Exclude from Growth Chart --    No data found.  Updated Vital Signs BP 123/82 (BP Location: Left Arm)   Pulse 64   Temp 98.1 F (36.7 C) (Oral)   Resp 18   Wt 77 kg   LMP  (LMP Unknown) Comment: More than 15 years ago  SpO2 97%   BMI 28.25 kg/m   Visual Acuity Right Eye Distance:   Left Eye Distance:   Bilateral Distance:    Right Eye Near:   Left Eye Near:    Bilateral Near:     Physical Exam Vitals and nursing note reviewed.  Constitutional:      Appearance: She is well-developed.  HENT:     Head: Normocephalic.   Cardiovascular:     Rate and Rhythm: Normal  rate.  Pulmonary:     Effort: Pulmonary effort is normal.  Abdominal:     General: There is no distension.   Musculoskeletal:        General: Normal range of motion.      Cervical back: Normal range of motion.   Skin:    General: Skin is warm.   Neurological:     General: No focal deficit present.     Mental Status: She is alert and oriented to person, place, and time.      UC Treatments / Results  Labs (all labs ordered are listed, but only abnormal results are displayed) Labs Reviewed - No data to display  EKG   Radiology No results found.  Procedures Procedures (including critical care time)  Medications Ordered in UC Medications - No data to display  Initial Impression / Assessment and Plan / UC Course  I have reviewed the triage vital signs and the nursing notes.  Pertinent labs & imaging results that were available during my care of the patient were reviewed by me and considered in my medical decision making (see chart for details).     Final Clinical Impressions(s) / UC Diagnoses   Final diagnoses:  Yeast vaginitis   Discharge Instructions   None    ED Prescriptions     Medication Sig Dispense Auth. Provider   fluconazole  (DIFLUCAN ) 150 MG tablet Take 1 tablet (150 mg total) by mouth every 3 (three) days. 2 tablet Estelita Iten K, PA-C      PDMP not reviewed this encounter.    An After Visit Summary was printed and given to the patient.    Sandi Crosby, PA-C 05/11/24 [phone]

## 2024-05-11 NOTE — ED Triage Notes (Addendum)
 Pt presents c/o yeast infection x 2 weeks. Pt says she has tried OTC products but sxs have not improved. Pt also c/o severe vaginal swelling. Last sexual encounter 2 weeks ago.

## 2024-05-12 ENCOUNTER — Inpatient Hospital Stay: Admission: RE | Admit: 2024-05-12 | Payer: Self-pay | Source: Ambulatory Visit

## 2024-05-13 ENCOUNTER — Ambulatory Visit
Admission: EM | Admit: 2024-05-13 | Discharge: 2024-05-13 | Disposition: A | Discharge status: Home / Self Care | Payer: Self-pay | Attending: Physician Assistant | Admitting: Physician Assistant

## 2024-05-13 ENCOUNTER — Encounter: Payer: Self-pay | Admitting: *Deleted

## 2024-05-13 DIAGNOSIS — A6000 Herpesviral infection of urogenital system, unspecified: Secondary | ICD-10-CM

## 2024-05-13 MED ORDER — VALACYCLOVIR HCL 1 G PO TABS
1000.0000 mg | ORAL_TABLET | Freq: Three times a day (TID) | ORAL | 0 refills | Status: DC
Start: 2024-05-13 — End: 2024-05-30

## 2024-05-13 NOTE — ED Provider Notes (Signed)
 [facility]    CSN: [id number] Arrival date & time: 05/13/24  0803      History   Chief Complaint Chief Complaint  Patient presents with   Rash    Herpes outbreak    HPI Stephanie Reid is a 33 y.o. female.   Patient presents today for evaluation of continued herpes flare.  She reports she only has a few tablets of her valacyclovir  left and she needs a refill of possible.  She has been using Vaseline on the area for comfort.  She denies any other new symptoms.  The history is provided by the patient.    Past Medical History:  Diagnosis Date   Bacterial vaginosis    Diabetes mellitus without complication (HCC)    Herpes    Herpes simplex    Hx of chlamydia infection    Hx of gonorrhea    Hx of trichomoniasis    Seasonal allergies     Patient Active Problem List   Diagnosis Date Noted   New onset type 2 diabetes mellitus (HCC) 07/21/2022   Dehydration 07/21/2022   Vaginal candidiasis 07/21/2022   Amenorrhea, secondary 10/06/2013    Past Surgical History:  Procedure Laterality Date   NO PAST SURGERIES      OB History     Gravida  0   Para  0   Term  0   Preterm  0   AB  0   Living  0      SAB  0   IAB  0   Ectopic  0   Multiple  0   Live Births  0            Home Medications    Prior to Admission medications   Medication Sig Start Date End Date Taking? Authorizing Provider  atorvastatin  (LIPITOR) 20 MG tablet Take 1 tablet (20 mg total) by mouth daily. 12/20/22   Christel Cousins, MD  blood glucose meter kit and supplies Dispense based on patient and insurance preference. Use up to four times daily as directed. (FOR ICD-10 E10.9, E11.9). 07/22/22   Hongalgi, Anand D, MD  Blood Glucose Monitoring Suppl DEVI 1 each by Does not apply route in the morning, at noon, and at bedtime. May substitute to any manufacturer covered by patient's insurance. 06/28/23   White, Maybelle Spatz, NP  Continuous Blood Gluc Sensor (DEXCOM G7  SENSOR) MISC 1 each by Does not apply route every 14 (fourteen) days. Every 10 days. Not 14 12/19/22   Christel Cousins, MD  fluconazole  (DIFLUCAN ) 150 MG tablet Take 1 tablet (150 mg total) by mouth every 3 (three) days. 05/11/24   Sofia, Leslie K, PA-C  Insulin  Pen Needle (PEN NEEDLES 3/16) 31G X 5 MM MISC Use twice daily as per instructions. 07/22/22   Hongalgi, Anand D, MD  metFORMIN  (GLUCOPHAGE ) 500 MG tablet Take 1 tablet (500 mg total) by mouth 2 (two) times daily with a meal. 12/17/22   Christel Cousins, MD  NOVOLIN  70/30 KWIKPEN (70-30) 100 UNIT/ML KwikPen INJECT 20 UNITS SUBCUTANEOUSLY TWICE DAILY BEFORE A MEAL 12/16/23   Christel Cousins, MD  Semaglutide ,0.25 or 0.5MG /DOS, 2 MG/3ML SOPN Inject 0.25 mg into the skin once a week. 12/17/22   Christel Cousins, MD  traMADol  (ULTRAM ) 50 MG tablet Take 1 tablet (50 mg total) by mouth every 6 (six) hours as needed. 03/26/23   White, Maybelle Spatz, NP  valACYclovir  (VALTREX ) 1000 MG tablet Take 1 tablet (1,000  mg total) by mouth 3 (three) times daily. 05/13/24   Vernestine Gondola, PA-C  promethazine  (PHENERGAN ) 12.5 MG tablet Take 1 tablet (12.5 mg total) by mouth every 6 (six) hours as needed for nausea or vomiting. 12/09/20 02/06/21  Camellia Caves, MD    Family History Family History  Problem Relation Age of Onset   Diabetes Mother    Hypertension Mother    Heart disease Mother        pacemaker   Stroke Mother 68   Heart attack Mother 40   Diabetes Father    Hypertension Maternal Grandmother    Diabetes Maternal Grandmother     Social History Social History   Tobacco Use   Smoking status: Every Day    Current packs/day: 0.10    Average packs/day: 0.1 packs/day for 15.0 years (1.5 ttl pk-yrs)    Types: Cigarettes    Passive exposure: Current   Smokeless tobacco: Never   Tobacco comments:    2 cigs per day   Vaping Use   Vaping status: Never Used  Substance Use Topics   Alcohol use: Yes    Alcohol/week: 4.0 standard drinks of  alcohol    Types: 4 Glasses of wine per week    Comment: wine qod and liquor on wekend   Drug use: Yes    Types: Marijuana    Comment: rarely.     Allergies   Apple juice, Banana, Orange fruit [citrus], Bacid, Spinach, Vicodin [hydrocodone -acetaminophen ], Hydrocodone -acetaminophen , Oxycodone -acetaminophen , and Penicillin  g   Review of Systems Review of Systems  Constitutional:  Negative for chills and fever.  Eyes:  Negative for discharge and redness.  Gastrointestinal:  Negative for abdominal pain, nausea and vomiting.  Genitourinary:  Positive for genital sores. Negative for vaginal discharge.     Physical Exam Triage Vital Signs ED Triage Vitals  Encounter Vitals Group     BP      Girls Systolic BP Percentile      Girls Diastolic BP Percentile      Boys Systolic BP Percentile      Boys Diastolic BP Percentile      Pulse      Resp      Temp      Temp src      SpO2      Weight      Height      Head Circumference      Peak Flow      Pain Score      Pain Loc      Pain Education      Exclude from Growth Chart    No data found.  Updated Vital Signs BP 113/78 (BP Location: Left Arm)   Pulse 72   Temp 98.2 F (36.8 C) (Oral)   Resp 16   LMP  (LMP Unknown) Comment: More than 15 years ago  SpO2 96%      Physical Exam Vitals and nursing note reviewed.  Constitutional:      General: She is not in acute distress.    Appearance: Normal appearance. She is not ill-appearing.  HENT:     Head: Normocephalic and atraumatic.   Eyes:     Conjunctiva/sclera: Conjunctivae normal.    Cardiovascular:     Rate and Rhythm: Normal rate.  Pulmonary:     Effort: Pulmonary effort is normal. No respiratory distress.   Neurological:     Mental Status: She is alert.   Psychiatric:  Mood and Affect: Mood normal.        Behavior: Behavior normal.        Thought Content: Thought content normal.      UC Treatments / Results  Labs (all labs ordered are listed,  but only abnormal results are displayed) Labs Reviewed - No data to display  EKG   Radiology No results found.  Procedures Procedures (including critical care time)  Medications Ordered in UC Medications - No data to display  Initial Impression / Assessment and Plan / UC Course  I have reviewed the triage vital signs and the nursing notes.  Pertinent labs & imaging results that were available during my care of the patient were reviewed by me and considered in my medical decision making (see chart for details).   Valtrex  refilled.  Encouraged follow-up if no gradual improvement or with any further concerns.   Final Clinical Impressions(s) / UC Diagnoses   Final diagnoses:  Genital herpes simplex, unspecified site   Discharge Instructions   None    ED Prescriptions     Medication Sig Dispense Auth. Provider   valACYclovir  (VALTREX ) 1000 MG tablet Take 1 tablet (1,000 mg total) by mouth 3 (three) times daily. 21 tablet Vernestine Gondola, PA-C      PDMP not reviewed this encounter.   Vernestine Gondola, PA-C 05/13/24 [phone]

## 2024-05-13 NOTE — ED Triage Notes (Signed)
 Pt reports herpes flare up since yesterday- needs med

## 2024-05-29 ENCOUNTER — Ambulatory Visit: Payer: Self-pay

## 2024-05-30 ENCOUNTER — Ambulatory Visit
Admission: EM | Admit: 2024-05-30 | Discharge: 2024-05-30 | Disposition: A | Discharge status: Home / Self Care | Payer: Self-pay | Attending: Physician Assistant | Admitting: Physician Assistant

## 2024-05-30 DIAGNOSIS — B009 Herpesviral infection, unspecified: Secondary | ICD-10-CM

## 2024-05-30 MED ORDER — VALACYCLOVIR HCL 500 MG PO TABS: 500.0000 mg | ORAL_TABLET | Freq: Two times a day (BID) | ORAL | 0 refills | Status: AC

## 2024-05-30 NOTE — ED Triage Notes (Signed)
 I am here for a Herpes outbreak that started about 3 days ago. No other concerns.

## 2024-05-30 NOTE — ED Provider Notes (Signed)
 [facility]    CSN: [id number] Arrival date & time: 05/30/24  1452      History   Chief Complaint Chief Complaint  Patient presents with   Skin Problem   Medication Refill    HPI Stephanie Reid is a 33 y.o. female.   Patient here today for treatment of herpes outbreak.  She reports that she did have resolution of symptoms and then they returned about 3 days ago.  She denies any other concerns.  She has no other symptoms.  Symptoms are no different than prior herpes outbreak.  The history is provided by the patient.  Medication Refill   Past Medical History:  Diagnosis Date   Bacterial vaginosis    Diabetes mellitus without complication (HCC)    Herpes    Herpes simplex    Hx of chlamydia infection    Hx of gonorrhea    Hx of trichomoniasis    Seasonal allergies     Patient Active Problem List   Diagnosis Date Noted   Herpes simplex 05/30/2024   New onset type 2 diabetes mellitus (HCC) 07/21/2022   Dehydration 07/21/2022   Vaginal candidiasis 07/21/2022   Amenorrhea, secondary 10/06/2013    Past Surgical History:  Procedure Laterality Date   NO PAST SURGERIES      OB History     Gravida  0   Para  0   Term  0   Preterm  0   AB  0   Living  0      SAB  0   IAB  0   Ectopic  0   Multiple  0   Live Births  0            Home Medications    Prior to Admission medications   Medication Sig Start Date End Date Taking? Authorizing Provider  atorvastatin  (LIPITOR) 20 MG tablet Take 1 tablet (20 mg total) by mouth daily. 12/20/22   Wendolyn Jenkins Jansky, MD  valACYclovir  (VALTREX ) 500 MG tablet Take 1 tablet (500 mg total) by mouth 2 (two) times daily for 10 days. 05/30/24 06/09/24 Yes Billy Asberry FALCON, PA-C  blood glucose meter kit and supplies Dispense based on patient and insurance preference. Use up to four times daily as directed. (FOR ICD-10 E10.9, E11.9). 07/22/22   Hongalgi, Anand D, MD  Blood Glucose Monitoring Suppl DEVI 1  each by Does not apply route in the morning, at noon, and at bedtime. May substitute to any manufacturer covered by patient's insurance. 06/28/23   White, Shelba JONELLE, NP  Continuous Blood Gluc Sensor (DEXCOM G7 SENSOR) MISC 1 each by Does not apply route every 14 (fourteen) days. Every 10 days. Not 14 12/19/22   Wendolyn Jenkins Jansky, MD  fluconazole  (DIFLUCAN ) 150 MG tablet Take 1 tablet (150 mg total) by mouth every 3 (three) days. 05/11/24   Sofia, Leslie K, PA-C  Insulin  Pen Needle (PEN NEEDLES 3/16) 31G X 5 MM MISC Use twice daily as per instructions. 07/22/22   Hongalgi, Anand D, MD  metFORMIN  (GLUCOPHAGE ) 500 MG tablet Take 1 tablet (500 mg total) by mouth 2 (two) times daily with a meal. 12/17/22   Wendolyn Jenkins Jansky, MD  NOVOLIN  70/30 KWIKPEN (70-30) 100 UNIT/ML KwikPen INJECT 20 UNITS SUBCUTANEOUSLY TWICE DAILY BEFORE A MEAL 12/16/23   Wendolyn Jenkins Jansky, MD  Semaglutide ,0.25 or 0.5MG /DOS, 2 MG/3ML SOPN Inject 0.25 mg into the skin once a week. 12/17/22   Wendolyn Jenkins Jansky, MD  traMADol  (  ULTRAM ) 50 MG tablet Take 1 tablet (50 mg total) by mouth every 6 (six) hours as needed. 03/26/23   White, Shelba SAUNDERS, NP  promethazine  (PHENERGAN ) 12.5 MG tablet Take 1 tablet (12.5 mg total) by mouth every 6 (six) hours as needed for nausea or vomiting. 12/09/20 02/06/21  Schuyler Charlie RAMAN, MD    Family History Family History  Problem Relation Age of Onset   Diabetes Mother    Hypertension Mother    Heart disease Mother        pacemaker   Stroke Mother 46   Heart attack Mother 65   Diabetes Father    Hypertension Maternal Grandmother    Diabetes Maternal Grandmother     Social History Social History   Tobacco Use   Smoking status: Every Day    Current packs/day: 0.10    Average packs/day: 0.1 packs/day for 15.0 years (1.5 ttl pk-yrs)    Types: Cigarettes    Passive exposure: Current   Smokeless tobacco: Never   Tobacco comments:    2 cigs per day   Vaping Use   Vaping status: Never Used  Substance Use  Topics   Alcohol use: Yes    Alcohol/week: 4.0 standard drinks of alcohol    Types: 4 Glasses of wine per week    Comment: wine qod and liquor on wekend   Drug use: Yes    Types: Marijuana    Comment: rarely.     Allergies   Apple juice, Banana, Orange fruit [citrus], Bacid, Spinach, Vicodin [hydrocodone -acetaminophen ], Hydrocodone -acetaminophen , Oxycodone -acetaminophen , and Penicillin  g   Review of Systems Review of Systems  Constitutional:  Negative for chills and fever.  Eyes:  Negative for discharge and redness.  Respiratory:  Negative for shortness of breath.   Gastrointestinal:  Negative for abdominal pain, nausea and vomiting.  Genitourinary:  Positive for genital sores.  Skin:  Positive for rash.     Physical Exam Triage Vital Signs ED Triage Vitals  Encounter Vitals Group     BP --      Girls Systolic BP Percentile --      Girls Diastolic BP Percentile --      Boys Systolic BP Percentile --      Boys Diastolic BP Percentile --      Pulse --      Resp --      Temp --      Temp src --      SpO2 --      Weight 05/30/24 1503 176 lb (79.8 kg)     Height 05/30/24 1503 5' 5 (1.651 m)     Head Circumference --      Peak Flow --      Pain Score 05/30/24 1501 10     Pain Loc --      Pain Education --      Exclude from Growth Chart --    No data found.  Updated Vital Signs BP 105/72 (BP Location: Left Arm)   Pulse 85   Temp 98.5 F (36.9 C) (Oral)   Resp 18   Ht 5' 5 (1.651 m)   Wt 176 lb (79.8 kg)   LMP  (LMP Unknown) Comment: More than 15 years ago  SpO2 96%   BMI 29.29 kg/m   Visual Acuity Right Eye Distance:   Left Eye Distance:   Bilateral Distance:    Right Eye Near:   Left Eye Near:    Bilateral Near:     Physical Exam  Vitals and nursing note reviewed.  Constitutional:      General: She is not in acute distress.    Appearance: Normal appearance. She is not ill-appearing.  HENT:     Head: Normocephalic and atraumatic.  Eyes:      Conjunctiva/sclera: Conjunctivae normal.  Cardiovascular:     Rate and Rhythm: Normal rate.  Pulmonary:     Effort: Pulmonary effort is normal. No respiratory distress.  Neurological:     Mental Status: She is alert.  Psychiatric:        Mood and Affect: Mood normal.        Behavior: Behavior normal.        Thought Content: Thought content normal.      UC Treatments / Results  Labs (all labs ordered are listed, but only abnormal results are displayed) Labs Reviewed - No data to display  EKG   Radiology No results found.  Procedures Procedures (including critical care time)  Medications Ordered in UC Medications - No data to display  Initial Impression / Assessment and Plan / UC Course  I have reviewed the triage vital signs and the nursing notes.  Pertinent labs & imaging results that were available during my care of the patient were reviewed by me and considered in my medical decision making (see chart for details).    Valtrex  refilled as requested.  Recommended follow-up if no gradual improvement with any further concerns.  Final Clinical Impressions(s) / UC Diagnoses   Final diagnoses:  Herpes simplex   Discharge Instructions   None    ED Prescriptions     Medication Sig Dispense Auth. Provider   valACYclovir  (VALTREX ) 500 MG tablet Take 1 tablet (500 mg total) by mouth 2 (two) times daily for 10 days. 20 tablet Billy Asberry FALCON, PA-C      PDMP not reviewed this encounter.   Billy Asberry FALCON, PA-C 05/30/24 1549

## 2024-06-22 ENCOUNTER — Ambulatory Visit
Admission: EM | Admit: 2024-06-22 | Discharge: 2024-06-22 | Disposition: A | Discharge status: Home / Self Care | Payer: Self-pay | Attending: Family Medicine | Admitting: Family Medicine

## 2024-06-22 DIAGNOSIS — A6000 Herpesviral infection of urogenital system, unspecified: Secondary | ICD-10-CM

## 2024-06-22 MED ORDER — VALACYCLOVIR HCL 1 G PO TABS: 1000.0000 mg | ORAL_TABLET | Freq: Every day | ORAL | 0 refills | Status: AC

## 2024-06-22 MED ORDER — VALACYCLOVIR HCL 1 G PO TABS: 1000.0000 mg | ORAL_TABLET | Freq: Every day | ORAL | 2 refills | Status: DC

## 2024-06-22 NOTE — ED Provider Notes (Signed)
 [facility]    CSN: [id number] Arrival date & time: 06/22/24  1710      History   Chief Complaint Chief Complaint  Patient presents with   Medication Refill    HPI Stephanie Reid is a 33 y.o. female.    Medication Refill  Here for recurrent outbreaks of genital herpes.  It has been very frequent in the last 3 or 4 months.  She is allergic to penicillin  and oxycodone  and hydrocodone   Last menstrual cycle was about 2 weeks ago.   Past Medical History:  Diagnosis Date   Bacterial vaginosis    Diabetes mellitus without complication (HCC)    Herpes    Herpes simplex    Hx of chlamydia infection    Hx of gonorrhea    Hx of trichomoniasis    Seasonal allergies     Patient Active Problem List   Diagnosis Date Noted   Herpes simplex 05/30/2024   New onset type 2 diabetes mellitus (HCC) 07/21/2022   Dehydration 07/21/2022   Vaginal candidiasis 07/21/2022   Amenorrhea, secondary 10/06/2013    Past Surgical History:  Procedure Laterality Date   NO PAST SURGERIES      OB History     Gravida  0   Para  0   Term  0   Preterm  0   AB  0   Living  0      SAB  0   IAB  0   Ectopic  0   Multiple  0   Live Births  0            Home Medications    Prior to Admission medications   Medication Sig Start Date End Date Taking? Authorizing Provider  atorvastatin  (LIPITOR) 20 MG tablet Take 1 tablet (20 mg total) by mouth daily. 12/20/22   Wendolyn Jenkins Jansky, MD  valACYclovir  (VALTREX ) 1000 MG tablet Take 1 tablet (1,000 mg total) by mouth daily. 06/22/24  Yes Vonna Sharlet POUR, MD  valACYclovir  (VALTREX ) 1000 MG tablet Take 1 tablet (1,000 mg total) by mouth daily for 5 days. 06/22/24 06/27/24 Yes Vonna Sharlet POUR, MD  blood glucose meter kit and supplies Dispense based on patient and insurance preference. Use up to four times daily as directed. (FOR ICD-10 E10.9, E11.9). 07/22/22   Hongalgi, Anand D, MD  Blood Glucose Monitoring Suppl  DEVI 1 each by Does not apply route in the morning, at noon, and at bedtime. May substitute to any manufacturer covered by patient's insurance. 06/28/23   White, Shelba JONELLE, NP  Continuous Blood Gluc Sensor (DEXCOM G7 SENSOR) MISC 1 each by Does not apply route every 14 (fourteen) days. Every 10 days. Not 14 12/19/22   Wendolyn Jenkins Jansky, MD  Insulin  Pen Needle (PEN NEEDLES 3/16) 31G X 5 MM MISC Use twice daily as per instructions. 07/22/22   Hongalgi, Anand D, MD  metFORMIN  (GLUCOPHAGE ) 500 MG tablet Take 1 tablet (500 mg total) by mouth 2 (two) times daily with a meal. 12/17/22   Wendolyn Jenkins Jansky, MD  NOVOLIN  70/30 KWIKPEN (70-30) 100 UNIT/ML KwikPen INJECT 20 UNITS SUBCUTANEOUSLY TWICE DAILY BEFORE A MEAL 12/16/23   Wendolyn Jenkins Jansky, MD  Semaglutide ,0.25 or 0.5MG /DOS, 2 MG/3ML SOPN Inject 0.25 mg into the skin once a week. 12/17/22   Wendolyn Jenkins Jansky, MD  traMADol  (ULTRAM ) 50 MG tablet Take 1 tablet (50 mg total) by mouth every 6 (six) hours as needed. 03/26/23   Teresa Shelba JONELLE, NP  promethazine  (PHENERGAN ) 12.5 MG tablet Take 1 tablet (12.5 mg total) by mouth every 6 (six) hours as needed for nausea or vomiting. 12/09/20 02/06/21  Schuyler Charlie RAMAN, MD    Family History Family History  Problem Relation Age of Onset   Diabetes Mother    Hypertension Mother    Heart disease Mother        pacemaker   Stroke Mother 44   Heart attack Mother 37   Diabetes Father    Hypertension Maternal Grandmother    Diabetes Maternal Grandmother     Social History Social History   Tobacco Use   Smoking status: Every Day    Current packs/day: 0.10    Average packs/day: 0.1 packs/day for 15.0 years (1.5 ttl pk-yrs)    Types: Cigarettes    Passive exposure: Current   Smokeless tobacco: Never   Tobacco comments:    2 cigs per day   Vaping Use   Vaping status: Never Used  Substance Use Topics   Alcohol use: Yes    Alcohol/week: 4.0 standard drinks of alcohol    Types: 4 Glasses of wine per week     Comment: wine qod and liquor on wekend   Drug use: Yes    Types: Marijuana    Comment: rarely.     Allergies   Apple juice, Banana, Orange fruit [citrus], Bacid, Spinach, Vicodin [hydrocodone -acetaminophen ], Hydrocodone -acetaminophen , Oxycodone -acetaminophen , and Penicillin  g   Review of Systems Review of Systems   Physical Exam Triage Vital Signs ED Triage Vitals  Encounter Vitals Group     BP 06/22/24 1738 104/71     Girls Systolic BP Percentile --      Girls Diastolic BP Percentile --      Boys Systolic BP Percentile --      Boys Diastolic BP Percentile --      Pulse Rate 06/22/24 1738 81     Resp 06/22/24 1738 18     Temp 06/22/24 1738 98.6 F (37 C)     Temp Source 06/22/24 1738 Oral     SpO2 06/22/24 1738 95 %     Weight 06/22/24 1735 164 lb 3.2 oz (74.5 kg)     Height 06/22/24 1735 5' 5 (1.651 m)     Head Circumference --      Peak Flow --      Pain Score 06/22/24 1733 0     Pain Loc --      Pain Education --      Exclude from Growth Chart --    No data found.  Updated Vital Signs BP 104/71 (BP Location: Left Arm)   Pulse 81   Temp 98.6 F (37 C) (Oral)   Resp 18   Ht 5' 5 (1.651 m)   Wt 74.5 kg   LMP  (LMP Unknown)   SpO2 95%   BMI 27.32 kg/m   Visual Acuity Right Eye Distance:   Left Eye Distance:   Bilateral Distance:    Right Eye Near:   Left Eye Near:    Bilateral Near:     Physical Exam Vitals reviewed.  Constitutional:      General: She is not in acute distress.    Appearance: She is not ill-appearing, toxic-appearing or diaphoretic.  Skin:    Coloration: Skin is not pale.  Neurological:     Mental Status: She is alert and oriented to person, place, and time.  Psychiatric:        Behavior: Behavior normal.  UC Treatments / Results  Labs (all labs ordered are listed, but only abnormal results are displayed) Labs Reviewed - No data to display  EKG   Radiology No results found.  Procedures Procedures (including  critical care time)  Medications Ordered in UC Medications - No data to display  Initial Impression / Assessment and Plan / UC Course  I have reviewed the triage vital signs and the nursing notes.  Pertinent labs & imaging results that were available during my care of the patient were reviewed by me and considered in my medical decision making (see chart for details).   She declines exam as she is certain what it is.  Valtrex  sent in for once daily for 5 days for the current outbreak  Since it has been so frequently bothering her, Valtrex  once daily for prevention is sent into the pharmacy.  Staff will help her find a primary care   Final Clinical Impressions(s) / UC Diagnoses   Final diagnoses:  Recurrent genital herpes     Discharge Instructions      Take valacyclovir  1000 mg--1 tablet daily for 5 days for this outbreak.  Take valacyclovir  1000 mg--1 tablet daily for prevention.  You can use the QR code/website at the back of the summary paperwork to schedule yourself a new patient appointment with primary care     ED Prescriptions     Medication Sig Dispense Auth. Provider   valACYclovir  (VALTREX ) 1000 MG tablet Take 1 tablet (1,000 mg total) by mouth daily. 30 tablet Vonna Sharlet POUR, MD   valACYclovir  (VALTREX ) 1000 MG tablet Take 1 tablet (1,000 mg total) by mouth daily for 5 days. 5 tablet Cortavius Montesinos, Sharlet POUR, MD      PDMP not reviewed this encounter.   Vonna Sharlet POUR, MD 06/22/24 2020

## 2024-06-22 NOTE — Discharge Instructions (Signed)
 Take valacyclovir  1000 mg--1 tablet daily for 5 days for this outbreak.  Take valacyclovir  1000 mg--1 tablet daily for prevention.  You can use the QR code/website at the back of the summary paperwork to schedule yourself a new patient appointment with primary care

## 2024-06-22 NOTE — ED Triage Notes (Signed)
 I just need the medication for my herpes outbreak, I have had another outbreak, that is my only concern. Rx need: Valtrex .

## 2024-07-27 ENCOUNTER — Ambulatory Visit: Payer: Self-pay

## 2024-07-28 ENCOUNTER — Encounter: Payer: Self-pay | Admitting: Family

## 2024-07-28 NOTE — Progress Notes (Signed)
 Erroneous encounter-disregard

## 2024-09-03 ENCOUNTER — Telehealth: Payer: Self-pay

## 2024-09-05 ENCOUNTER — Ambulatory Visit
Admission: EM | Admit: 2024-09-05 | Discharge: 2024-09-05 | Disposition: A | Discharge status: Home / Self Care | Payer: Self-pay | Attending: Internal Medicine | Admitting: Internal Medicine

## 2024-09-05 DIAGNOSIS — R102 Pelvic and perineal pain unspecified side: Secondary | ICD-10-CM

## 2024-09-05 DIAGNOSIS — N898 Other specified noninflammatory disorders of vagina: Secondary | ICD-10-CM

## 2024-09-05 NOTE — Discharge Instructions (Addendum)
 Examination done today of the vagina including speculum exam.  On examination there is a mass at the entrance with a small ulceration in the center.  HSV swab was done of this area.  We also did a vaginal swab for STI examination.  Due to the duration and symptoms as well as the physical exam findings of the mass we recommend follow-up with gynecology for further evaluation.  We have placed a referral for this however you will need to contact their office to schedule an appointment.  The testing done today will take approximate 24 to 48 hours to finalize results and we will contact you if these are positive and need any further treatment.  Can return to urgent care as needed.

## 2024-09-05 NOTE — ED Provider Notes (Signed)
 [facility]    CSN: [id number] Arrival date & time: 09/05/24  1017      History   Chief Complaint Chief Complaint  Patient presents with   Vaginal Discomfort    HPI Stephanie Reid is a 33 y.o. female.   33 year old female presents urgent care with complaints of vaginal pain and vaginal mass.  She reports for several months she has been having severe pain with intercourse.  She has noted on self-exam that there is a mass in the area that is very tender to the touch.  She denies any vaginal discharge or bleeding that is abnormal.  She denies any dysuria, hematuria, abdominal pain, fevers, chills.  She does not currently have a gynecologist or primary care provider.  She reports that the symptoms have been worsening and intercourse is extremely uncomfortable for her.  She would like for someone to look at the area to see if there is anything that can be done.     Past Medical History:  Diagnosis Date   Bacterial vaginosis    Diabetes mellitus without complication (HCC)    Herpes    Herpes simplex    Hx of chlamydia infection    Hx of gonorrhea    Hx of trichomoniasis    Seasonal allergies     Patient Active Problem List   Diagnosis Date Noted   Herpes simplex 05/30/2024   New onset type 2 diabetes mellitus (HCC) 07/21/2022   Dehydration 07/21/2022   Vaginal candidiasis 07/21/2022   Amenorrhea, secondary 10/06/2013    Past Surgical History:  Procedure Laterality Date   NO PAST SURGERIES      OB History     Gravida  0   Para  0   Term  0   Preterm  0   AB  0   Living  0      SAB  0   IAB  0   Ectopic  0   Multiple  0   Live Births  0            Home Medications    Prior to Admission medications   Medication Sig Start Date End Date Taking? Authorizing Provider  atorvastatin  (LIPITOR) 20 MG tablet Take 1 tablet (20 mg total) by mouth daily. 12/20/22   Wendolyn Jenkins Jansky, MD  valACYclovir  (VALTREX ) 1000 MG tablet Take 1  tablet (1,000 mg total) by mouth daily. 06/22/24  Yes Vonna Sharlet POUR, MD  blood glucose meter kit and supplies Dispense based on patient and insurance preference. Use up to four times daily as directed. (FOR ICD-10 E10.9, E11.9). 07/22/22   Hongalgi, Anand D, MD  Blood Glucose Monitoring Suppl DEVI 1 each by Does not apply route in the morning, at noon, and at bedtime. May substitute to any manufacturer covered by patient's insurance. 06/28/23   Allura Doepke, Shelba JONELLE, NP  Continuous Blood Gluc Sensor (DEXCOM G7 SENSOR) MISC 1 each by Does not apply route every 14 (fourteen) days. Every 10 days. Not 14 12/19/22   Wendolyn Jenkins Jansky, MD  Insulin  Pen Needle (PEN NEEDLES 3/16) 31G X 5 MM MISC Use twice daily as per instructions. 07/22/22   Hongalgi, Anand D, MD  metFORMIN  (GLUCOPHAGE ) 500 MG tablet Take 1 tablet (500 mg total) by mouth 2 (two) times daily with a meal. 12/17/22   Wendolyn Jenkins Jansky, MD  NOVOLIN  70/30 KWIKPEN (70-30) 100 UNIT/ML KwikPen INJECT 20 UNITS SUBCUTANEOUSLY TWICE DAILY BEFORE A MEAL 12/16/23   Wendolyn Jenkins  Earnie, MD  Semaglutide ,0.25 or 0.5MG /DOS, 2 MG/3ML SOPN Inject 0.25 mg into the skin once a week. 12/17/22   Wendolyn Jenkins Earnie, MD  traMADol  (ULTRAM ) 50 MG tablet Take 1 tablet (50 mg total) by mouth every 6 (six) hours as needed. 03/26/23   Kendall Justo, Shelba SAUNDERS, NP  promethazine  (PHENERGAN ) 12.5 MG tablet Take 1 tablet (12.5 mg total) by mouth every 6 (six) hours as needed for nausea or vomiting. 12/09/20 02/06/21  Schuyler Charlie RAMAN, MD    Family History Family History  Problem Relation Age of Onset   Diabetes Mother    Hypertension Mother    Heart disease Mother        pacemaker   Stroke Mother 65   Heart attack Mother 12   Diabetes Father    Hypertension Maternal Grandmother    Diabetes Maternal Grandmother     Social History Social History   Tobacco Use   Smoking status: Every Day    Current packs/day: 0.10    Average packs/day: 0.1 packs/day for 15.0 years (1.5 ttl pk-yrs)     Types: Cigarettes    Passive exposure: Current   Smokeless tobacco: Never   Tobacco comments:    2 cigs per day   Vaping Use   Vaping status: Never Used  Substance Use Topics   Alcohol use: Yes    Alcohol/week: 4.0 standard drinks of alcohol    Types: 4 Glasses of wine per week    Comment: wine qod and liquor on wekend   Drug use: Yes    Types: Marijuana    Comment: rarely.     Allergies   Apple juice, Banana, Orange fruit [citrus], Bacid, Spinach, Vicodin [hydrocodone -acetaminophen ], Hydrocodone -acetaminophen , Oxycodone -acetaminophen , and Penicillin  g   Review of Systems Review of Systems  Constitutional:  Negative for chills and fever.  HENT:  Negative for ear pain and sore throat.   Eyes:  Negative for pain and visual disturbance.  Respiratory:  Negative for cough and shortness of breath.   Cardiovascular:  Negative for chest pain and palpitations.  Gastrointestinal:  Negative for abdominal pain and vomiting.  Genitourinary:  Positive for vaginal pain. Negative for dysuria and hematuria.  Musculoskeletal:  Negative for arthralgias and back pain.  Skin:  Negative for color change and rash.  Neurological:  Negative for seizures and syncope.  All other systems reviewed and are negative.    Physical Exam Triage Vital Signs ED Triage Vitals  Encounter Vitals Group     BP 09/05/24 1046 107/76     Girls Systolic BP Percentile --      Girls Diastolic BP Percentile --      Boys Systolic BP Percentile --      Boys Diastolic BP Percentile --      Pulse Rate 09/05/24 1046 73     Resp 09/05/24 1046 18     Temp 09/05/24 1046 97.8 F (36.6 C)     Temp Source 09/05/24 1046 Oral     SpO2 09/05/24 1046 98 %     Weight 09/05/24 1044 159 lb (72.1 kg)     Height 09/05/24 1044 5' 5 (1.651 m)     Head Circumference --      Peak Flow --      Pain Score 09/05/24 1037 0     Pain Loc --      Pain Education --      Exclude from Growth Chart --    No data found.  Updated Vital  Signs BP  107/76 (BP Location: Left Arm)   Pulse 73   Temp 97.8 F (36.6 C) (Oral)   Resp 18   Ht 5' 5 (1.651 m)   Wt 159 lb (72.1 kg)   LMP  (Exact Date) Comment: Last was about 13-14 yrs ago.  SpO2 98%   BMI 26.46 kg/m   Visual Acuity Right Eye Distance:   Left Eye Distance:   Bilateral Distance:    Right Eye Near:   Left Eye Near:    Bilateral Near:     Physical Exam Vitals and nursing note reviewed.  Constitutional:      General: She is not in acute distress.    Appearance: She is well-developed.  HENT:     Head: Normocephalic and atraumatic.  Eyes:     Conjunctiva/sclera: Conjunctivae normal.  Cardiovascular:     Rate and Rhythm: Normal rate and regular rhythm.     Heart sounds: No murmur heard. Pulmonary:     Effort: Pulmonary effort is normal. No respiratory distress.     Breath sounds: Normal breath sounds.  Abdominal:     Palpations: Abdomen is soft.     Tenderness: There is no abdominal tenderness.  Genitourinary:  Musculoskeletal:        General: No swelling.     Cervical back: Neck supple.  Skin:    General: Skin is warm and dry.     Capillary Refill: Capillary refill takes less than 2 seconds.  Neurological:     Mental Status: She is alert.  Psychiatric:        Mood and Affect: Mood normal.      UC Treatments / Results  Labs (all labs ordered are listed, but only abnormal results are displayed) Labs Reviewed  HSV 1/2 PCR (SURFACE)  CERVICOVAGINAL ANCILLARY ONLY    EKG   Radiology No results found.  Procedures Procedures (including critical care time)  Medications Ordered in UC Medications - No data to display  Initial Impression / Assessment and Plan / UC Course  I have reviewed the triage vital signs and the nursing notes.  Pertinent labs & imaging results that were available during my care of the patient were reviewed by me and considered in my medical decision making (see chart for details).     Vaginal mass - Plan:  HSV 1/2 PCR (Surface), HSV 1/2 PCR (Surface)  Vaginal pain   Examination done today of the vagina including speculum exam.  On examination there is a mass at the entrance with a small ulceration in the center.  HSV swab was done of this area.  We also did a vaginal swab for STI examination.  Due to the duration and symptoms as well as the physical exam findings of the mass we recommend follow-up with gynecology for further evaluation.  We have placed a referral for this however you will need to contact their office to schedule an appointment.  The testing done today will take approximate 24 to 48 hours to finalize results and we will contact you if these are positive and need any further treatment.  Can return to urgent care as needed.  Final Clinical Impressions(s) / UC Diagnoses   Final diagnoses:  Vaginal mass  Vaginal pain     Discharge Instructions      Examination done today of the vagina including speculum exam.  On examination there is a mass at the entrance with a small ulceration in the center.  HSV swab was done of this area.  We  also did a vaginal swab for STI examination.  Due to the duration and symptoms as well as the physical exam findings of the mass we recommend follow-up with gynecology for further evaluation.  We have placed a referral for this however you will need to contact their office to schedule an appointment.  The testing done today will take approximate 24 to 48 hours to finalize results and we will contact you if these are positive and need any further treatment.  Can return to urgent care as needed.    ED Prescriptions   None    PDMP not reviewed this encounter.   Teresa Almarie LABOR, [place] 09/05/24 1201

## 2024-09-05 NOTE — ED Triage Notes (Signed)
 Patient presents to Urgent Care with concerns with pain during and after intercourse, which has been ongoing for several months. There is no observed bleeding before or after intercourse. The patient reports a history of recurrent herpes infections but no recent or noticeable outbreaks or rashes. They are currently taking Valacyclovir . The patient also mentions feeling a palpable area or spot when inserting a finger.

## 2024-09-13 ENCOUNTER — Telehealth: Payer: Self-pay

## 2024-09-13 DIAGNOSIS — B009 Herpesviral infection, unspecified: Secondary | ICD-10-CM

## 2024-09-13 MED ORDER — VALACYCLOVIR HCL 500 MG PO TABS: 500.0000 mg | ORAL_TABLET | Freq: Two times a day (BID) | ORAL | 3 refills | Status: AC

## 2024-09-13 NOTE — Telephone Encounter (Signed)
 Incoming call/information:  Patient calling to check on lab test results from 09-05-2024.  Outgoing call/additional information:   Called Lab and spoke with Rexene, transferred to [facility] who states the swab was never received after speaking to other staff members. Reviewed courier/lab log with staff over the phone that show's both swabs (viral media and cyto (aptima) went out. After recheck [facility] Cyto/Microbiology lab says never received.  Outcome/next step:  Sending this message to ordering/collecting provider as RICK, Nurse manager for collection detail follow up, sent to provider (in clinic)-Brett for review and advisement for when I call back patient.  Redell BATTLE CMA II [facility]

## 2024-09-15 ENCOUNTER — Ambulatory Visit: Payer: Self-pay | Admitting: Nurse Practitioner

## 2024-10-15 ENCOUNTER — Ambulatory Visit: Payer: Self-pay | Admitting: Nurse Practitioner

## 2024-11-02 ENCOUNTER — Ambulatory Visit: Payer: Self-pay | Admitting: Nurse Practitioner

## 2024-11-30 ENCOUNTER — Ambulatory Visit: Payer: Self-pay | Admitting: Nurse Practitioner

## 2024-12-01 ENCOUNTER — Ambulatory Visit: Payer: Self-pay | Admitting: Nurse Practitioner

## 2024-12-05 ENCOUNTER — Other Ambulatory Visit: Payer: Self-pay

## 2024-12-05 ENCOUNTER — Emergency Department (HOSPITAL_COMMUNITY)
Admission: EM | Admit: 2024-12-05 | Discharge: 2024-12-05 | Disposition: A | Discharge status: Home / Self Care | Payer: Self-pay | Attending: Emergency Medicine | Admitting: Emergency Medicine

## 2024-12-05 ENCOUNTER — Encounter (HOSPITAL_COMMUNITY): Payer: Self-pay | Admitting: Emergency Medicine

## 2024-12-05 DIAGNOSIS — N898 Other specified noninflammatory disorders of vagina: Secondary | ICD-10-CM | POA: Insufficient documentation

## 2024-12-05 LAB — HIV ANTIBODY (ROUTINE TESTING W REFLEX): HIV Screen 4th Generation wRfx: NONREACTIVE

## 2024-12-05 LAB — WET PREP, GENITAL
Sperm: NONE SEEN
Trich, Wet Prep: NONE SEEN
WBC, Wet Prep HPF POC: <10
Yeast Wet Prep HPF POC: NONE SEEN

## 2024-12-05 MED ORDER — DOXYCYCLINE HYCLATE 100 MG PO TABS
100.0000 mg | ORAL_TABLET | Freq: Once | ORAL | Status: AC
  Administered 2024-12-05: 100 mg via ORAL
  Filled 2024-12-05: qty 1

## 2024-12-05 MED ORDER — DOXYCYCLINE HYCLATE 100 MG PO CAPS: 100.0000 mg | ORAL_CAPSULE | Freq: Two times a day (BID) | ORAL | 0 refills | Status: AC

## 2024-12-05 MED ORDER — CEFTRIAXONE SODIUM 1 G IJ SOLR
500.0000 mg | Freq: Once | INTRAMUSCULAR | Status: AC
  Administered 2024-12-05: 500 mg via INTRAMUSCULAR
  Filled 2024-12-05: qty 10

## 2024-12-05 MED ORDER — METRONIDAZOLE 500 MG PO TABS: 500.0000 mg | ORAL_TABLET | Freq: Two times a day (BID) | ORAL | 0 refills | Status: AC

## 2024-12-05 NOTE — ED Provider Notes (Signed)
 " [facility] Emergency Department Provider Note MRN:  [id number]  Arrival date & time: 12/05/2024     Chief Complaint   Vaginal Discharge   History of Present Illness   Stephanie Reid is a 34 y.o. year-old female presents to the ED with chief complaint of vaginal discharge.  She states that she has been having symptoms for the past several months.  She reports associated pain with intercourse, but also states she has not had intercourse in several months.  She denies fevers or chills.  Denies any other associated symptoms.  Patient denies pregnancy or breast feeding.  History provided by patient.   Review of Systems  Pertinent positive and negative review of systems noted in HPI.    Physical Exam   Vitals:   12/05/24 0350  BP: 121/87  Pulse: 81  Resp: 15  Temp: 98.1 F (36.7 C)  SpO2: 98%    CONSTITUTIONAL:  non toxic-appearing, NAD NEURO:  Alert and oriented x 3, CN 3-12 grossly intact EYES:  eyes equal and reactive ENT/NECK:  Supple, no stridor  CARDIO:  appears well-perfused  PULM:  No respiratory distress,  GI/GU:  non-distended, chaperone present for pelvic exam, thick white discharge present, genital warts present MSK/SPINE:  No gross deformities, no edema, moves all extremities  SKIN:  no rash, atraumatic   *Additional and/or pertinent findings included in MDM below  Diagnostic and Interventional Summary    EKG Interpretation Date/Time:    Ventricular Rate:    PR Interval:    QRS Duration:    QT Interval:    QTC Calculation:   R Axis:      Text Interpretation:         Labs Reviewed  WET PREP, GENITAL  HIV ANTIBODY (ROUTINE TESTING W REFLEX)  GC/CHLAMYDIA PROBE AMP ([place]) NOT AT [facility]    No orders to display    Medications  doxycycline  (VIBRA -TABS) tablet 100 mg (has no administration in time range)  cefTRIAXone  (ROCEPHIN ) injection 500 mg (has no administration in time range)     Procedures  /  Critical  Care Procedures  ED Course and Medical Decision Making  I have reviewed the triage vital signs, the nursing notes, and pertinent available records from the EMR.  Social Determinants Affecting Complexity of Care: Patient has no clinically significant social determinants affecting this chief complaint..   ED Course:    Medical Decision Making Patient here with vaginal discharge and painful intercourse.  States she has not had intercourse in several months.  She does have thick white/yellow vaginal discharge as well as what appears to be genital warts.  Will treat for PID here.  Recommend follow-up with PCP or gynecology for further treatment and evaluation of suspected HPV.  Amount and/or Complexity of Data Reviewed Labs: ordered.  Risk Prescription drug management.         Consultants: No consultations were needed in caring for this patient.   Treatment and Plan: Emergency department workup does not suggest an emergent condition requiring admission or immediate intervention beyond  what has been performed at this time. The patient is safe for discharge and has  been instructed to return immediately for worsening symptoms, change in  symptoms or any other concerns    Final Clinical Impressions(s) / ED Diagnoses     ICD-10-CM   1. Vaginal discharge  N89.8       ED Discharge Orders          Ordered  doxycycline  (VIBRAMYCIN ) 100 MG capsule  2 times daily        12/05/24 0528    metroNIDAZOLE  (FLAGYL ) 500 MG tablet  2 times daily        12/05/24 9471              Discharge Instructions Discussed with and Provided to Patient:   Discharge Instructions   None      Vicky Charleston, PA-C 12/05/24 0532    Trine Raynell Moder, MD 12/05/24 [id number]  "

## 2024-12-05 NOTE — Discharge Instructions (Signed)
 Please follow-up with your regular doctor and/your gynecologist for further treatment and evaluation of suspected genital warts.  Please take medications as prescribed for the vaginal discharge.

## 2024-12-05 NOTE — ED Triage Notes (Addendum)
 Pt reports she has been having thick white vaginal discharge & itching. Reports she does have herpes and has been taking valacyclovir  x 3 months. Reports new s/s x 1 month.

## 2024-12-06 LAB — GC/CHLAMYDIA PROBE AMP (~~LOC~~) NOT AT ARMC
Chlamydia: NEGATIVE
Comment: NEGATIVE
Comment: NORMAL
Neisseria Gonorrhea: NEGATIVE

## 2024-12-22 ENCOUNTER — Encounter: Payer: Self-pay | Admitting: Family Medicine

## 2024-12-22 ENCOUNTER — Telehealth: Payer: Self-pay | Admitting: Family Medicine

## 2024-12-22 DIAGNOSIS — B009 Herpesviral infection, unspecified: Secondary | ICD-10-CM

## 2024-12-22 DIAGNOSIS — B3731 Acute candidiasis of vulva and vagina: Secondary | ICD-10-CM

## 2024-12-22 MED ORDER — VALACYCLOVIR HCL 500 MG PO TABS: 500.0000 mg | ORAL_TABLET | Freq: Two times a day (BID) | ORAL | 0 refills | Status: AC

## 2024-12-22 MED ORDER — FLUCONAZOLE 150 MG PO TABS: 150.0000 mg | ORAL_TABLET | ORAL | 0 refills | Status: AC

## 2024-12-22 NOTE — Patient Instructions (Addendum)
 " Stephanie Reid, thank you for joining Stephanie CHRISTELLA Barefoot, NP for today's virtual visit.  While this provider is not your primary care provider (PCP), if your PCP is located in our provider database this encounter information will be shared with them immediately following your visit.   A [facility] MyChart account gives you access to today's visit and all your visits, tests, and labs performed at [facility]  click here if you don't have a [facility] MyChart account or go to [email or web address]  Consent: (Patient) Stephanie Reid provided verbal consent for this virtual visit at the beginning of the encounter.  Current Medications:  Current Outpatient Medications:    atorvastatin  (LIPITOR) 20 MG tablet, Take 1 tablet (20 mg total) by mouth daily., Disp: 90 tablet, Rfl: 0   fluconazole  (DIFLUCAN ) 150 MG tablet, Take 1 tablet (150 mg total) by mouth as directed. Repeat in 7 days as needed, Disp: 2 tablet, Rfl: 0   valACYclovir  (VALTREX ) 500 MG tablet, Take 1 tablet (500 mg total) by mouth 2 (two) times daily for 10 days., Disp: 20 tablet, Rfl: 0   blood glucose meter kit and supplies, Dispense based on patient and insurance preference. Use up to four times daily as directed. (FOR ICD-10 E10.9, E11.9)., Disp: 1 each, Rfl: 0   Blood Glucose Monitoring Suppl DEVI, 1 each by Does not apply route in the morning, at noon, and at bedtime. May substitute to any manufacturer covered by patient's insurance., Disp: 1 each, Rfl: 0   Continuous Blood Gluc Sensor (DEXCOM G7 SENSOR) MISC, 1 each by Does not apply route every 14 (fourteen) days. Every 10 days. Not 14, Disp: 3 each, Rfl: 5   doxycycline  (VIBRAMYCIN ) 100 MG capsule, Take 1 capsule (100 mg total) by mouth 2 (two) times daily., Disp: 28 capsule, Rfl: 0   Insulin  Pen Needle (PEN NEEDLES 3/16) 31G X 5 MM MISC, Use twice daily as per instructions., Disp: 100 each, Rfl: 0   metFORMIN  (GLUCOPHAGE ) 500 MG tablet, Take 1 tablet (500  mg total) by mouth 2 (two) times daily with a meal., Disp: 180 tablet, Rfl: 0   metroNIDAZOLE  (FLAGYL ) 500 MG tablet, Take 1 tablet (500 mg total) by mouth 2 (two) times daily., Disp: 14 tablet, Rfl: 0   NOVOLIN  70/30 KWIKPEN (70-30) 100 UNIT/ML KwikPen, INJECT 20 UNITS SUBCUTANEOUSLY TWICE DAILY BEFORE A MEAL, Disp: 15 mL, Rfl: 0   Semaglutide ,0.25 or 0.5MG /DOS, 2 MG/3ML SOPN, Inject 0.25 mg into the skin once a week., Disp: 3 mL, Rfl: 1   traMADol  (ULTRAM ) 50 MG tablet, Take 1 tablet (50 mg total) by mouth every 6 (six) hours as needed., Disp: 15 tablet, Rfl: 0   Medications ordered in this encounter:  Meds ordered this encounter  Medications   valACYclovir  (VALTREX ) 500 MG tablet    Sig: Take 1 tablet (500 mg total) by mouth 2 (two) times daily for 10 days.    Dispense:  20 tablet    Refill:  0    Supervising Provider:   LAMPTEY, PHILIP O [[id number]]   fluconazole  (DIFLUCAN ) 150 MG tablet    Sig: Take 1 tablet (150 mg total) by mouth as directed. Repeat in 7 days as needed    Dispense:  2 tablet    Refill:  0    Supervising Provider:   LAMPTEY, PHILIP O [[id number]]     *If you need refills on other medications prior to your next appointment, please contact your pharmacy*  Follow-Up: Call back  or seek an in-person evaluation if the symptoms worsen or if the condition fails to improve as anticipated.  [facility] [phone]  Other Instructions Genital Herpes Genital herpes is a common sexually transmitted infection (STI) that is caused by a virus. The virus spreads from person to person through contact with a sore, infected saliva, or infected skin. The virus can cause itching, blisters, and sores around the genitals or rectum. During an outbreak of infection, symptoms may last for several days and then go away. However, the virus remains in the body, so more outbreaks may happen in the future. The time between outbreaks varies and can be from months to years. Genital  herpes can affect anyone. It is particularly concerning for pregnant women because the virus can be passed to the baby during delivery. Genital herpes is also a concern for people who have a weak disease-fighting system (immune system). What are the causes? This condition is caused by the herpes simplex virus, type 1 or type 2 (HSV-1 or HSV-2). The virus may spread through: Sexual contact with an infected person, including vaginal, anal, and oral sex. Contact with a herpes sore. The skin. This means that you can get herpes from an infected partner even if there are no blisters or sores present. Your partner may not know that he or she is infected. What increases the risk? You are more likely to develop this condition if: You have sex with many partners. You do not use latex or polyurethane condoms during sex. What are the signs or symptoms? Most people do not have symptoms or they have mild symptoms that may be mistaken for other skin problems. Symptoms may include: Small, red bumps near the genitals, rectum, or mouth. These bumps turn into blisters and then sores. Flu-like (influenza-like) symptoms, including: Fever. Body aches. Swollen lymph nodes. Headache. Painful urination. Pain and itching in the genital area or rectal area. Vaginal discharge. Tingling or shooting pain in the legs and buttocks. Generally, symptoms are more severe and last longer during the first (primary) outbreak. Influenza-like symptoms are also more common during the primary outbreak. How is this diagnosed? This condition may be diagnosed based on: A physical exam. Your medical history. Blood tests. A test of a fluid sample (culture) from an open sore. How is this treated? There is no cure for this condition, but treatment with antiviral medicines can do the following: Speed up healing and relieve symptoms. Help to reduce the spread of the virus to sexual partners. Limit the chance of future outbreaks, or  make future outbreaks shorter. Lessen symptoms of future outbreaks. Your health care provider may also recommend over-the-counter medicines to help with pain and itching. Follow these instructions at home: If you have an outbreak:  Keep the affected areas dry and clean. Avoid rubbing or touching blisters and sores. If you do touch blisters or sores: Wash your hands thoroughly with soap and water  for at least 20 seconds. If soap and water  are not available, use an alcohol-based hand sanitizer. Do not touch your eyes afterward. Sexual activity Do not have sexual contact during active outbreaks. Practice safe sex. Herpes can spread even if your partner does not have blisters or sores. Latex or polyurethane condoms and female condoms may help prevent the spread of the herpes virus. Managing pain and discomfort If directed, put ice on the painful area. To do this: Put ice in a plastic bag. Place a towel between your skin and the bag. Leave the  ice on for 20 minutes, 2-3 times a day. Remove the ice if your skin turns bright red. This is very important. If you cannot feel pain, heat, or cold, you have a greater risk of damage to the area. If told, take a cool sitz bath to help relieve pain or itching. A sitz bath is a water  bath that you take while sitting down in water  that is deep enough to cover your hips and buttocks. General instructions Take over-the-counter and prescription medicines only as told by your health care provider. If you were prescribed an antiviral medicine, use it as told by your health care provider. Do not stop using the antiviral even if you start to feel better. Keep all follow-up visits. This is important. How is this prevented? Use condoms. Although you can get genital herpes during sexual contact even with the use of a condom, a condom can provide some protection. Avoid having multiple sexual partners. Talk with your sexual partner about any symptoms either of you may  have. Also, talk with your partner about any history of STIs. Do not have sexual contact if you have active symptoms of genital herpes. Contact a health care provider if: Your symptoms are not improving with medicine. Your symptoms return, or you have new symptoms. You have a fever. You have abdominal pain. You have redness, swelling, or pain in your eye. You notice new sores on other parts of your body. You have had herpes and you become pregnant or plan to become pregnant. Get help right away if: You have symptoms of viral meningitis. This is rare but may happen if the virus spreads to the brain. Symptoms may include: Severe headache or stiff neck. Muscle aches. Nausea and vomiting. Sensitivity to light. Summary Genital herpes is a common sexually transmitted infection (STI) that is caused by the herpes simplex virus, type 1 or type 2 (HSV-1 or HSV-2). These viruses are most often spread through sexual contact with an infected person. You are more likely to develop this condition if you have sex with many partners or you do not use condoms during sex. Most people do not have symptoms or have mild symptoms that may be mistaken for other skin problems. Symptoms occur as outbreaks that may happen months or years apart. There is no cure for this condition, but treatment with oral antiviral medicines can reduce symptoms, reduce the chance of spreading the virus to a partner, prevent future outbreaks, or shorten future outbreaks. This information is not intended to replace advice given to you by your health care provider. Make sure you discuss any questions you have with your health care provider. Document Revised: 08/16/2021 Document Reviewed: 08/16/2021 Elsevier Patient Education  2024 Elsevier [employer]   If you have been instructed to have an in-person evaluation today at a local Urgent Care facility, please use the link below. It will take you to a list of all of our available [facility], including address, phone number and hours of operation. Please do not delay care.  [facility]  If you or a family member do not have a primary care provider, use the link below to schedule a visit and establish care. When you choose a [facility] primary care physician or advanced practice provider, you gain a long-term partner in health. Find a Primary Care Provider  Learn more about [facility]'s in-office and virtual care options: [facility] - Get Care Now  "

## 2024-12-22 NOTE — Progress Notes (Signed)
 [place]   Completed the wrong form on EV

## 2024-12-22 NOTE — Progress Notes (Signed)
 " Virtual Visit Consent   Stephanie Reid, you are scheduled for a virtual visit with a [facility] provider today. Just as with appointments in the office, your consent must be obtained to participate. Your consent will be active for this visit and any virtual visit you may have with one of our providers in the next 365 days. If you have a MyChart account, a copy of this consent can be sent to you electronically.  As this is a virtual visit, video technology does not allow for your provider to perform a traditional examination. This may limit your provider's ability to fully assess your condition. If your provider identifies any concerns that need to be evaluated in person or the need to arrange testing (such as labs, EKG, etc.), we will make arrangements to do so. Although advances in technology are sophisticated, we cannot ensure that it will always work on either your end or our end. If the connection with a video visit is poor, the visit may have to be switched to a telephone visit. With either a video or telephone visit, we are not always able to ensure that we have a secure connection.  By engaging in this virtual visit, you consent to the provision of healthcare and authorize for your insurance to be billed (if applicable) for the services provided during this visit. Depending on your insurance coverage, you may receive a charge related to this service.  I need to obtain your verbal consent now. Are you willing to proceed with your visit today? Stephanie Reid has provided verbal consent on 12/22/2024 for a virtual visit (video or telephone). Chiquita CHRISTELLA Barefoot, NP  Date: 12/22/2024 9:16 AM   Virtual Visit via Video Note   I, Chiquita CHRISTELLA Barefoot, connected with  Stephanie Reid  ([id number], 02-Dec-1990) on 12/22/24 at  9:15 AM EST by a video-enabled telemedicine application and verified that I am speaking with the correct person using two identifiers.  Location: Patient: Virtual Visit Location  Patient: Home Provider: Virtual Visit Location Provider: Home Office   I discussed the limitations of evaluation and management by telemedicine and the availability of in person appointments. The patient expressed understanding and agreed to proceed.    History of Present Illness: Stephanie Reid is a 34 y.o. who identifies as a female who was assigned female at birth, and is being seen today for genital warts  Onset was several weeks to about 2 months ago- reports no vaginal discharge, but noting the sores feeling like they are having discharge and causing irritation and itching.  Associated symptoms are as stated above. Modifying factors are completed doxy and flagyl  for possible PID- but wet prep and swabs negative.  Denies chest pain, shortness of breath, pelvic pain or back pain, fevers, chills, blood in urine or stool.  Denies being pregnant or breast feeding.    Problems:  Patient Active Problem List   Diagnosis Date Noted   Herpes simplex 05/30/2024   New onset type 2 diabetes mellitus (HCC) 07/21/2022   Dehydration 07/21/2022   Vaginal candidiasis 07/21/2022   Amenorrhea, secondary 10/06/2013    Allergies: Allergies[1] Medications: Current Medications[2]  Observations/Objective: Patient is well-developed, well-nourished in no acute distress.  Resting comfortably  at home.  Head is normocephalic, atraumatic.  No labored breathing.  Speech is clear and coherent with logical content.  Patient is alert and oriented at baseline.  Given the area assessment was deferred  Assessment and Plan:   1. Herpes simplex (Primary)  -  valACYclovir  (VALTREX ) 500 MG tablet; Take 1 tablet (500 mg total) by mouth 2 (two) times daily for 10 days.  Dispense: 20 tablet; Refill: 0   2. Yeast vaginitis  - fluconazole  (DIFLUCAN ) 150 MG tablet; Take 1 tablet (150 mg total) by mouth as directed. Repeat in 7 days as needed  Dispense: 2 tablet; Refill: 0  -Herpes simplex appears to have  had this for over a year, was not aware that this was an ongoing concern and that required maintenance treatment.  -Was not aware of yeast infection possibly being the cause of her discharge and itching.  Since being treated with Doxy and Flagyl  this probably amplified the discharge and discomfort which is why she had up reaching out to us .  Provided detail information on both diagnoses and treatment measures.  Additionally provided information about our virtual PC program so that she can have a primary care on board given that she is also a type II diabetic that has not had recent labs and/or treatment.  CCing the PCP provider and CMA  Patient appears to be willing to try the program.  She does not have any insurance at this time.    Reviewed side effects, risks and benefits of medication.    Patient acknowledged agreement and understanding of the plan.   Past Medical, Surgical, Social History, Allergies, and Medications have been Reviewed.   Follow Up Instructions: I discussed the assessment and treatment plan with the patient. The patient was provided an opportunity to ask questions and all were answered. The patient agreed with the plan and demonstrated an understanding of the instructions.  A copy of instructions were sent to the patient via MyChart unless otherwise noted below.    The patient was advised to call back or seek an in-person evaluation if the symptoms worsen or if the condition fails to improve as anticipated.    Chiquita CHRISTELLA Barefoot, NP     [1]  Allergies Allergen Reactions   Apple Juice Anaphylaxis   Banana Anaphylaxis   Orange Fruit [Citrus] Anaphylaxis   Bacid Other (See Comments)    Hemmorrhoids, Not per patient.    Spinach Diarrhea   Vicodin [Hydrocodone -Acetaminophen ] Nausea And Vomiting   Hydrocodone -Acetaminophen      Other Reaction(s): GI Intolerance   Oxycodone -Acetaminophen      Other Reaction(s): GI Intolerance   Penicillin  G     GI Upset  (intolerance)  Did it involve swelling of the face/tongue/throat, SOB, or low BP? No  Did it involve sudden or severe rash/hives, skin peeling, or any reaction on the inside of your mouth or nose? No  Did you need to seek medical attention at a hospital or doctor's office? No  When did it last happen?  not recent  If all above answers are NO, may proceed with cephalosporin use.  Other Reaction(s): GI Intolerance  [2]  Current Outpatient Medications:    atorvastatin  (LIPITOR) 20 MG tablet, Take 1 tablet (20 mg total) by mouth daily., Disp: 90 tablet, Rfl: 0   blood glucose meter kit and supplies, Dispense based on patient and insurance preference. Use up to four times daily as directed. (FOR ICD-10 E10.9, E11.9)., Disp: 1 each, Rfl: 0   Blood Glucose Monitoring Suppl DEVI, 1 each by Does not apply route in the morning, at noon, and at bedtime. May substitute to any manufacturer covered by patient's insurance., Disp: 1 each, Rfl: 0   Continuous Blood Gluc Sensor (DEXCOM G7 SENSOR) MISC, 1 each by Does not apply route  every 14 (fourteen) days. Every 10 days. Not 14, Disp: 3 each, Rfl: 5   doxycycline  (VIBRAMYCIN ) 100 MG capsule, Take 1 capsule (100 mg total) by mouth 2 (two) times daily., Disp: 28 capsule, Rfl: 0   Insulin  Pen Needle (PEN NEEDLES 3/16) 31G X 5 MM MISC, Use twice daily as per instructions., Disp: 100 each, Rfl: 0   metFORMIN  (GLUCOPHAGE ) 500 MG tablet, Take 1 tablet (500 mg total) by mouth 2 (two) times daily with a meal., Disp: 180 tablet, Rfl: 0   metroNIDAZOLE  (FLAGYL ) 500 MG tablet, Take 1 tablet (500 mg total) by mouth 2 (two) times daily., Disp: 14 tablet, Rfl: 0   NOVOLIN  70/30 KWIKPEN (70-30) 100 UNIT/ML KwikPen, INJECT 20 UNITS SUBCUTANEOUSLY TWICE DAILY BEFORE A MEAL, Disp: 15 mL, Rfl: 0   Semaglutide ,0.25 or 0.5MG /DOS, 2 MG/3ML SOPN, Inject 0.25 mg into the skin once a week., Disp: 3 mL, Rfl: 1   traMADol  (ULTRAM ) 50 MG tablet, Take 1 tablet (50 mg total) by mouth  every 6 (six) hours as needed., Disp: 15 tablet, Rfl: 0   valACYclovir  (VALTREX ) 1000 MG tablet, Take 1 tablet (1,000 mg total) by mouth daily., Disp: 30 tablet, Rfl: 2  "

## 2024-12-27 ENCOUNTER — Encounter: Payer: Self-pay | Admitting: Nurse Practitioner
